# Patient Record
Sex: Female | Born: 1955 | Race: White | Hispanic: No | Marital: Married | Smoking: Current every day smoker
Health system: Southern US, Community
[De-identification: ages and names within clinical notes are randomized; demographics above are authoritative.]

## PROBLEM LIST (undated history)

## (undated) DIAGNOSIS — I10 Essential (primary) hypertension: Secondary | ICD-10-CM

## (undated) DIAGNOSIS — Z98811 Dental restoration status: Secondary | ICD-10-CM

## (undated) DIAGNOSIS — F419 Anxiety disorder, unspecified: Secondary | ICD-10-CM

## (undated) DIAGNOSIS — H3530 Unspecified macular degeneration: Secondary | ICD-10-CM

## (undated) DIAGNOSIS — M1990 Unspecified osteoarthritis, unspecified site: Secondary | ICD-10-CM

## (undated) DIAGNOSIS — N6310 Unspecified lump in the right breast, unspecified quadrant: Secondary | ICD-10-CM

## (undated) DIAGNOSIS — E039 Hypothyroidism, unspecified: Secondary | ICD-10-CM

## (undated) HISTORY — PX: OTHER SURGICAL HISTORY: SHX169

## (undated) HISTORY — DX: Anxiety disorder, unspecified: F41.9

## (undated) HISTORY — PX: THYROIDECTOMY: SHX17

## (undated) HISTORY — DX: Essential (primary) hypertension: I10

## (undated) HISTORY — DX: Unspecified macular degeneration: H35.30

## (undated) HISTORY — DX: Hypothyroidism, unspecified: E03.9

---

## 1998-07-25 ENCOUNTER — Ambulatory Visit (HOSPITAL_COMMUNITY): Admission: RE | Admit: 1998-07-25 | Discharge: 1998-07-25 | Payer: Self-pay | Admitting: Obstetrics and Gynecology

## 1999-09-18 ENCOUNTER — Encounter: Payer: Self-pay | Admitting: Gynecology

## 1999-09-18 ENCOUNTER — Ambulatory Visit (HOSPITAL_COMMUNITY): Admission: RE | Admit: 1999-09-18 | Discharge: 1999-09-18 | Payer: Self-pay | Admitting: Gynecology

## 1999-10-08 ENCOUNTER — Other Ambulatory Visit: Admission: RE | Admit: 1999-10-08 | Discharge: 1999-10-08 | Payer: Self-pay | Admitting: Gynecology

## 1999-11-15 ENCOUNTER — Other Ambulatory Visit: Admission: RE | Admit: 1999-11-15 | Discharge: 1999-11-15 | Payer: Self-pay | Admitting: Gynecology

## 2000-10-13 ENCOUNTER — Other Ambulatory Visit: Admission: RE | Admit: 2000-10-13 | Discharge: 2000-10-13 | Payer: Self-pay | Admitting: Gynecology

## 2001-10-19 ENCOUNTER — Other Ambulatory Visit: Admission: RE | Admit: 2001-10-19 | Discharge: 2001-10-19 | Payer: Self-pay | Admitting: Family Medicine

## 2002-10-14 ENCOUNTER — Other Ambulatory Visit: Admission: RE | Admit: 2002-10-14 | Discharge: 2002-10-14 | Payer: Self-pay | Admitting: Gynecology

## 2003-10-20 ENCOUNTER — Other Ambulatory Visit: Admission: RE | Admit: 2003-10-20 | Discharge: 2003-10-20 | Payer: Self-pay | Admitting: Obstetrics and Gynecology

## 2004-11-14 ENCOUNTER — Other Ambulatory Visit: Admission: RE | Admit: 2004-11-14 | Discharge: 2004-11-14 | Payer: Self-pay | Admitting: Obstetrics and Gynecology

## 2005-12-11 ENCOUNTER — Other Ambulatory Visit: Admission: RE | Admit: 2005-12-11 | Discharge: 2005-12-11 | Payer: Self-pay | Admitting: Obstetrics and Gynecology

## 2006-01-15 ENCOUNTER — Encounter: Payer: Self-pay | Admitting: Obstetrics and Gynecology

## 2006-02-21 ENCOUNTER — Ambulatory Visit (HOSPITAL_COMMUNITY): Admission: RE | Admit: 2006-02-21 | Discharge: 2006-02-21 | Payer: Self-pay | Admitting: Obstetrics and Gynecology

## 2006-02-21 ENCOUNTER — Encounter (INDEPENDENT_AMBULATORY_CARE_PROVIDER_SITE_OTHER): Payer: Self-pay | Admitting: Specialist

## 2006-02-21 HISTORY — PX: DILATATION & CURRETTAGE/HYSTEROSCOPY WITH RESECTOCOPE: SHX5572

## 2007-01-07 ENCOUNTER — Encounter: Admission: RE | Admit: 2007-01-07 | Discharge: 2007-01-07 | Payer: Self-pay | Admitting: Obstetrics and Gynecology

## 2007-01-22 ENCOUNTER — Ambulatory Visit: Payer: Self-pay | Admitting: Cardiology

## 2007-10-16 ENCOUNTER — Encounter (INDEPENDENT_AMBULATORY_CARE_PROVIDER_SITE_OTHER): Payer: Self-pay | Admitting: *Deleted

## 2007-10-16 LAB — CONVERTED CEMR LAB
Cholesterol: 163 mg/dL
Triglycerides: 69 mg/dL

## 2007-11-13 ENCOUNTER — Ambulatory Visit: Payer: Self-pay | Admitting: Cardiology

## 2009-10-12 ENCOUNTER — Encounter (INDEPENDENT_AMBULATORY_CARE_PROVIDER_SITE_OTHER): Payer: Self-pay | Admitting: *Deleted

## 2009-10-12 LAB — CONVERTED CEMR LAB
BUN: 13 mg/dL
Calcium: 9.9 mg/dL
Creatinine, Ser: 0.9 mg/dL
Sodium, serum: 137 mmol/L

## 2010-01-12 ENCOUNTER — Encounter (INDEPENDENT_AMBULATORY_CARE_PROVIDER_SITE_OTHER): Payer: Self-pay | Admitting: *Deleted

## 2010-01-26 ENCOUNTER — Encounter: Payer: Self-pay | Admitting: Family Medicine

## 2010-02-13 ENCOUNTER — Encounter (INDEPENDENT_AMBULATORY_CARE_PROVIDER_SITE_OTHER): Payer: Self-pay | Admitting: *Deleted

## 2010-02-13 DIAGNOSIS — M1990 Unspecified osteoarthritis, unspecified site: Secondary | ICD-10-CM | POA: Insufficient documentation

## 2010-02-13 DIAGNOSIS — F17200 Nicotine dependence, unspecified, uncomplicated: Secondary | ICD-10-CM | POA: Insufficient documentation

## 2010-02-13 DIAGNOSIS — I10 Essential (primary) hypertension: Secondary | ICD-10-CM | POA: Insufficient documentation

## 2010-02-13 DIAGNOSIS — F411 Generalized anxiety disorder: Secondary | ICD-10-CM | POA: Insufficient documentation

## 2010-02-13 DIAGNOSIS — G5600 Carpal tunnel syndrome, unspecified upper limb: Secondary | ICD-10-CM | POA: Insufficient documentation

## 2010-02-13 DIAGNOSIS — E039 Hypothyroidism, unspecified: Secondary | ICD-10-CM | POA: Insufficient documentation

## 2010-03-09 ENCOUNTER — Ambulatory Visit: Payer: Self-pay | Admitting: Family Medicine

## 2010-03-09 DIAGNOSIS — H3530 Unspecified macular degeneration: Secondary | ICD-10-CM | POA: Insufficient documentation

## 2010-03-30 ENCOUNTER — Ambulatory Visit: Payer: Self-pay | Admitting: Family Medicine

## 2010-04-03 ENCOUNTER — Telehealth: Payer: Self-pay | Admitting: Family Medicine

## 2010-07-23 ENCOUNTER — Telehealth: Payer: Self-pay | Admitting: Family Medicine

## 2010-10-01 ENCOUNTER — Other Ambulatory Visit: Payer: Self-pay | Admitting: Family Medicine

## 2010-10-01 ENCOUNTER — Ambulatory Visit
Admission: RE | Admit: 2010-10-01 | Discharge: 2010-10-01 | Payer: Self-pay | Source: Home / Self Care | Attending: Family Medicine | Admitting: Family Medicine

## 2010-10-01 DIAGNOSIS — E785 Hyperlipidemia, unspecified: Secondary | ICD-10-CM | POA: Insufficient documentation

## 2010-10-01 DIAGNOSIS — E663 Overweight: Secondary | ICD-10-CM | POA: Insufficient documentation

## 2010-10-01 DIAGNOSIS — E669 Obesity, unspecified: Secondary | ICD-10-CM | POA: Insufficient documentation

## 2010-10-01 LAB — HEPATIC FUNCTION PANEL
ALT: 38 U/L — ABNORMAL HIGH (ref 0–35)
AST: 31 U/L (ref 0–37)
Albumin: 4.4 g/dL (ref 3.5–5.2)
Alkaline Phosphatase: 61 U/L (ref 39–117)
Bilirubin, Direct: 0.1 mg/dL (ref 0.0–0.3)
Total Bilirubin: 0.8 mg/dL (ref 0.3–1.2)
Total Protein: 7.4 g/dL (ref 6.0–8.3)

## 2010-10-01 LAB — LIPID PANEL
Cholesterol: 201 mg/dL — ABNORMAL HIGH (ref 0–200)
HDL: 68.8 mg/dL (ref >39.00)
Total CHOL/HDL Ratio: 3
Triglycerides: 94 mg/dL (ref 0.0–149.0)
VLDL: 18.8 mg/dL (ref 0.0–40.0)

## 2010-10-01 LAB — LDL CHOLESTEROL, DIRECT: Direct LDL: 114.7 mg/dL

## 2010-10-14 LAB — CONVERTED CEMR LAB
ALT: 36 units/L — ABNORMAL HIGH (ref 0–35)
AST: 27 units/L (ref 0–37)
Albumin: 4.3 g/dL (ref 3.5–5.2)
BUN: 18 mg/dL (ref 6–23)
Basophils Absolute: 0 10*3/uL (ref 0.0–0.1)
CO2: 30 meq/L (ref 19–32)
Chloride: 105 meq/L (ref 96–112)
Cholesterol: 218 mg/dL — ABNORMAL HIGH (ref 0–200)
Creatinine, Ser: 0.8 mg/dL (ref 0.4–1.2)
Direct LDL: 135 mg/dL
Eosinophils Absolute: 0.3 10*3/uL (ref 0.0–0.7)
MCHC: 34.5 g/dL (ref 30.0–36.0)
MCV: 90.4 fL (ref 78.0–100.0)
Monocytes Absolute: 0.5 10*3/uL (ref 0.1–1.0)
Neutrophils Relative %: 58.9 % (ref 43.0–77.0)
Platelets: 183 10*3/uL (ref 150.0–400.0)
RDW: 12.5 % (ref 11.5–14.6)
Total Bilirubin: 0.5 mg/dL (ref 0.3–1.2)
Triglycerides: 72 mg/dL (ref 0.0–149.0)

## 2010-10-18 NOTE — Assessment & Plan Note (Signed)
Summary: rto 6 months.cbs   Vital Signs:  Patient profile:   55 year old female Weight:      160 pounds BMI:     28.00 Pulse rate:   90 / minute BP sitting:   138 / 80  (left arm)  Vitals Entered By: Doristine Devoid CMA (October 01, 2010 9:17 AM) CC: 6 month roa    History of Present Illness: 55 yo woman here today for f/u on  1) HTN- BP slightly higher than last visit.  admits to some anxiety about visit.  no CP, SOB, HAs, visual changes, edema.  2) elevated cholesterol- has been working on diet and exercise.  goal is to avoid meds.  3) overweight- used to go to [employer] 5x/week.  wants to resume exercising.  has lost 6 lbs since last visit.  4) tobacco use- down to 1/2 ppd.  goal is to quit when she resumes exercising.  Preventive Screening-Counseling & Management  Alcohol-Tobacco     Smoking Status: current     Smoking Cessation Counseling: yes     Smoke Cessation Stage: contemplative     Packs/Day: 0.5  Current Medications (verified): 1)  Levothyroxine Sodium 100 Mcg Tabs (Levothyroxine Sodium) .... Take One Tablet Daily 2)  Hyzaar 50-12.5 Mg Tabs (Losartan Potassium-Hctz) .... Take One Tablet Daily 3)  Citalopram Hydrobromide 40 Mg Tabs (Citalopram Hydrobromide) .... 1.5 Tabs Daily 4)  Osteo Bi-Flex Adv Double St  Tabs (Misc Natural Products) .... Take One Tablet Daily 5)  Vision Formula  Tabs (Multiple Vitamins-Minerals) .... Take One Tablet Daily  Allergies (verified): No Known Drug Allergies  Past History:  Past medical, surgical, family and social histories (including risk factors) reviewed, and no changes noted (except as noted below).  Past Medical History: Anxiety Hypertension Hypothyroidism Carpal Tunnel Osteoarthritis- R thumb Tobacco Abuse Macular Degeneration Hyperlipidemia-  Past Surgical History: Reviewed history from 02/13/2010 and no changes required. Thyroidectomy  Family History: Reviewed history from 03/09/2010 and no changes  required. CAD-mother,father HTN-mother,father DM-father STROKE-no COLON CA-no BREAST CA-no Dementia- mother  Social History: Reviewed history from 03/09/2010 and no changes required. 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 No apparent alopecia or balding. Neck:  No deformities, masses, or tenderness noted. Lungs:  Normal respiratory effort, chest expands symmetrically. Lungs are clear to auscultation, no crackles or wheezes. Heart:  Normal rate and regular rhythm. S1 and S2 normal without gallop, murmur, click, rub or other extra sounds. Abdomen:  soft, NT/ND, +BS Pulses:  +2 carotid, radial, DP Extremities:  no C/C/E   Impression & Recommendations:  Problem # 1:  HYPERTENSION (ICD-401.9) Assessment Unchanged asymptomatic.  continue meds at this time.  pt's goal is to get off meds. Her updated medication list for this problem includes:    Hyzaar 50-12.5 Mg Tabs (Losartan potassium-hctz) .Marland Kitchen... Take one tablet daily  Problem # 2:  HYPERLIPIDEMIA (ICD-272.4) Assessment: New has been attempting to control w/ diet and exercise.  recheck labs today. Orders: Venipuncture (56213) TLB-Lipid Panel (80061-LIPID) TLB-Hepatic/Liver Function Pnl (80076-HEPATIC) Specimen Handling (08657)  Problem # 3:  OVERWEIGHT (ICD-278.02) Assessment: New has lost 6 lbs since last visit.  she plans on re-starting her exercise program.  Problem # 4:  TOBACCO ABUSE (ICD-305.1) Assessment: Unchanged again advised pt to quit.  will follow.  Complete Medication List: 1)  Levothyroxine Sodium 100 Mcg Tabs (Levothyroxine sodium) .... Take one tablet daily 2)  Hyzaar 50-12.5 Mg Tabs (Losartan potassium-hctz) .... Take one tablet daily 3)   Citalopram Hydrobromide 40 Mg Tabs (Citalopram hydrobromide) .... 1.5 tabs daily 4)  Osteo Bi-flex Adv Double St Tabs (Misc natural products) .... Take one tablet daily 5)  Vision Formula Tabs (Multiple vitamins-minerals) .... Take one tablet daily  Patient Instructions: 1)  Please schedule your complete physical for July- do not eat before this appt 2)  We'll notify you of your lab results 3)  Call with any questions or concerns 4)  I'm excited about your decision to restart your exercise program! 5)  Call with any questions or concerns 6)  Happy New Year!!   Orders Added: 1)  Venipuncture [25956] 2)  TLB-Lipid Panel [80061-LIPID] 3)  TLB-Hepatic/Liver Function Pnl [80076-HEPATIC] 4)  Specimen Handling [99000] 5)  Est. Patient Level IV [38756]

## 2010-10-18 NOTE — Assessment & Plan Note (Signed)
Summary: CPX/FASTING//KN   Vital Signs:  Patient profile:   55 year old female Height:      63.50 inches Weight:      166 pounds BMI:     29.05 Pulse rate:   102 / minute BP sitting:   120 / 80  (left arm)  Vitals Entered By: Doristine Devoid CMA (March 30, 2010 8:02 AM) CC: CPX AND LABS   History of Present Illness: 55 yo woman here today for CPE.  UTD on pap and mammogram.  colonoscopy at [facility] 1-2 yrs ago.  Depression/anxiety- much improved on increased dose of Citalopram.  HTN- adequate control.  asymptomatic.  compliant w/ meds.  hypothyroid- due for labs  Preventive Screening-Counseling & Management  Alcohol-Tobacco     Alcohol drinks/day: <1     Smoking Status: current     Smoking Cessation Counseling: yes  Caffeine-Diet-Exercise     Does Patient Exercise: no      Drug Use:  never.    Problems Prior to Update: 1)  Physical Examination  (ICD-V70.0) 2)  Macular Degeneration  (ICD-362.50) 3)  Tobacco Abuse  (ICD-305.1) 4)  Carpal Tunnel Syndrome, Right  (ICD-354.0) 5)  Osteoarthritis  (ICD-715.90) 6)  Hypothyroidism  (ICD-244.9) 7)  Hypertension  (ICD-401.9) 8)  Anxiety  (ICD-300.00)  Current Medications (verified): 1)  Levothyroxine Sodium 100 Mcg Tabs (Levothyroxine Sodium) .... Take One Tablet Daily 2)  Hyzaar 50-12.5 Mg Tabs (Losartan Potassium-Hctz) .... Take One Tablet Daily 3)  Calcium 600-200 Mg-Unit Tabs (Calcium-Vitamin D) .... Take 2 Tablets Daily 4)  Citalopram Hydrobromide 40 Mg Tabs (Citalopram Hydrobromide) .Marland Kitchen.. 1 Tab Daily 5)  Fish Oil 1200 Mg Caps (Omega-3 Fatty Acids) .... Take One Tablet Daily 6)  Osteo Bi-Flex Adv Double St  Tabs (Misc Natural Products) .... Take One Tablet Daily 7)  [facility]  Tabs (Multiple Vitamins-Minerals) .... Take One Tablet Daily 8)  Vision Formula  Tabs (Multiple Vitamins-Minerals) .... 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    Physical Exam  General:  Well-developed,well-nourished,in no acute distress; alert,appropriate and cooperative throughout examination Head:  Normocephalic and atraumatic without obvious abnormalities. No apparent alopecia or balding. Eyes:  No corneal or conjunctival inflammation noted. EOMI. Perrla. Funduscopic exam deferred to ophtho. Vision grossly normal. Ears:  External ear exam shows no significant lesions or deformities.  Otoscopic examination reveals clear canals, tympanic membranes are intact bilaterally without bulging, retraction, inflammation or discharge. Hearing is grossly normal bilaterally. Nose:  External nasal examination shows no deformity or inflammation. Nasal mucosa are pink and moist without lesions or exudates. Mouth:  Oral mucosa and oropharynx without lesions or exudates.  Teeth in good repair. Neck:  No deformities, masses, or tenderness noted. Breasts:  deferred to gyn Lungs:  Normal respiratory effort, chest expands symmetrically. Lungs are clear to auscultation, no crackles  or wheezes. Heart:  Normal rate and regular rhythm. S1 and  S2 normal without gallop, murmur, click, rub or other extra sounds. Abdomen:  Bowel sounds positive,abdomen soft and non-tender without masses, organomegaly or hernias noted. Genitalia:  deferred to gyn Msk:  No deformity or scoliosis noted of thoracic or lumbar spine.   Pulses:  +2 carotid, radial, DP Extremities:  no C/C/E Neurologic:  No cranial nerve deficits noted. Station and gait are normal. Plantar reflexes are down-going bilaterally. DTRs are symmetrical throughout. Sensory, motor and coordinative functions appear intact. Skin:  Intact without suspicious lesions or rashes Cervical Nodes:  No lymphadenopathy noted Axillary Nodes:  No palpable lymphadenopathy Inguinal Nodes:  No significant adenopathy Psych:  Cognition and judgment appear intact. Alert and cooperative with normal attention span and concentration. No apparent delusions, illusions, hallucinations   Impression & Recommendations:  Problem # 1:  PHYSICAL EXAMINATION (ICD-V70.0) Assessment New PE WNL.  UTD on health maintainence.  check labs.  anticipatory guidance provided. Orders: TLB-Lipid Panel (80061-LIPID) TLB-Hepatic/Liver Function Pnl (80076-HEPATIC) T-Vitamin D (25-Hydroxy) (16109-60454) Specimen Handling (09811) Specimen Handling (91478)  Problem # 2:  ANXIETY (ICD-300.00) Assessment: Improved pt feels mood has improved on increased dose. Her updated medication list for this problem includes:    Citalopram Hydrobromide 40 Mg Tabs (Citalopram hydrobromide) .Marland Kitchen... 1 tab daily  Problem # 3:  HYPOTHYROIDISM (ICD-244.9) Assessment: Unchanged check labs.  adjust meds as needed. Her updated medication list for this problem includes:    Levothyroxine Sodium 100 Mcg Tabs (Levothyroxine sodium) .Marland Kitchen... Take one tablet daily  Orders: Venipuncture (29562) TLB-TSH (Thyroid Stimulating Hormone) (84443-TSH)  Problem # 4:  HYPERTENSION (ICD-401.9) Assessment: Unchanged BP adequately controlled.  check labs. Her  updated medication list for this problem includes:    Hyzaar 50-12.5 Mg Tabs (Losartan potassium-hctz) .Marland Kitchen... Take one tablet daily  Orders: TLB-BMP (Basic Metabolic Panel-BMET) (80048-METABOL) TLB-CBC Platelet - w/Differential (85025-CBCD) Specimen Handling (13086)  Complete Medication List: 1)  Levothyroxine Sodium 100 Mcg Tabs (Levothyroxine sodium) .... Take one tablet daily 2)  Hyzaar 50-12.5 Mg Tabs (Losartan potassium-hctz) .... Take one tablet daily 3)  Calcium 600-200 Mg-unit Tabs (Calcium-vitamin d) .... Take 2 tablets daily 4)  Citalopram Hydrobromide 40 Mg Tabs (Citalopram hydrobromide) .Marland Kitchen.. 1 tab daily 5)  Fish Oil 1200 Mg Caps (Omega-3 fatty acids) .... Take one tablet daily 6)  Osteo Bi-flex Adv Double St Tabs (Misc natural products) .... Take one tablet daily 7)  [facility] Tabs (Multiple vitamins-minerals) .... Take one tablet daily 8)  Vision Formula Tabs (Multiple vitamins-minerals) .... Take one tablet daily  Patient Instructions: 1)  Please schedule a follow-up appointment in 6 months to recheck your blood pressure. 2)  Call with any questions or concerns 3)  Consider counseling if the stress increases 4)  We'll notify you of your lab results 5)  Have a great summer!   Appended Document: CPX/FASTING//KN     Clinical Lists Changes  Orders: Added new Service order of EKG w/ Interpretation (93000) - Signed

## 2010-10-18 NOTE — Progress Notes (Signed)
Summary: Lab Number  Phone Note Call from Patient   Caller: Patient Summary of Call: Wants to know what the Cholesterol numbers from her lab results  holesterol LDL - Direct                             135.0 mg/dL     Optimal:  <161 mg/dL     Near or Above Optimal:  100-129 mg/dL...............................    Called pt she is aware of lab numbers Initial call taken by: Kathrynn Speed CMA,  April 03, 2010 11:29 AM

## 2010-10-18 NOTE — Assessment & Plan Note (Signed)
Summary: new to est//[facility] ins//lch   Vital Signs:  Patient profile:   55 year old female Height:      63.50 inches Weight:      166 pounds BMI:     29.05 Pulse rate:   80 / minute BP sitting:   134 / 90  (left arm)  Vitals Entered By: Doristine Devoid (March 09, 2010 3:42 PM) CC: NEW EST- change in depression medication    History of Present Illness: 55 yo woman here today to establish care.    1) Depression- currently on Citalopram 20mg  daily.  feels it's not strong enough.  parents are elderly, mom w/ dementia.  reports easily frustrated, now w/ shorter fuse.  having a hard time dealing w/ work, parents, home life, etc.  doesn't want to 'snap at people'  2) HTN- tolerating Hyzaar.  no CP, SOB, HAs, visual changes, edema  3) Health Maintainence- UTD on pap and mammogram, due for labs.  4) Tobacco use- 1/2 ppd.  knows she needs to quit but w/ all her current stressors doesn't feel like now is the right time.  5) hypothyroid- has been stable on meds for awhile.  s/p thyroidectomy.  Preventive Screening-Counseling & Management  Alcohol-Tobacco     Smoking Status: current     Smoking Cessation Counseling: yes     Smoke Cessation Stage: contemplative     Packs/Day: 0.5  Caffeine-Diet-Exercise     Does Patient Exercise: no      Sexual History:  currently monogamous.        Drug Use:  never.    Current Medications (verified): 1)  Levothyroxine Sodium 100 Mcg Tabs (Levothyroxine Sodium) .... Take One Tablet Daily 2)  Hyzaar 50-12.5 Mg Tabs (Losartan Potassium-Hctz) .... Take One Tablet Daily 3)  Calcium 600-200 Mg-Unit Tabs (Calcium-Vitamin D) .... Take 2 Tablets Daily 4)  Citalopram Hydrobromide 40 Mg Tabs (Citalopram Hydrobromide) .Marland Kitchen.. 1 Tab Daily 5)  Fish Oil 1200 Mg Caps (Omega-3 Fatty Acids) .... Take One Tablet Daily 6)  Osteo Bi-Flex Adv Double St  Tabs (Misc Natural Products) .... Take One Tablet Daily 7)  [facility]  Tabs (Multiple Vitamins-Minerals) ....  Take One Tablet Daily 8)  Vision Formula  Tabs (Multiple Vitamins-Minerals) .... 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  The patient denies anorexia, fever, weight loss, decreased hearing, chest pain, syncope, dyspnea on exertion, peripheral edema, prolonged cough, headaches, abdominal pain, abnormal bleeding, and enlarged lymph nodes.    Physical Exam  General:  Well-developed,well-nourished,in no acute distress; alert,appropriate and cooperative throughout examination Head:  Normocephalic and atraumatic without obvious abnormalities. No apparent alopecia or balding. Neck:  scar at site of thyroidectomy Lungs:  Normal respiratory effort, chest expands symmetrically. Lungs are clear to auscultation, no crackles or wheezes. Heart:  Normal rate and regular rhythm. S1 and S2 normal without gallop, murmur, click, rub or other extra sounds. Pulses:  +2 carotid, radial, DP Extremities:  no C/C/E Cervical Nodes:  No lymphadenopathy noted Psych:  emotional while discussing parents but appropriately so   Impression & Recommendations:  Problem # 1:  ANXIETY (ICD-300.00) pt's anxiety not controlled on Celexa 20mg .  will increase to 40mg  and follow closely.  strongly encouraged therapy- pt has done this before and has a therapist.  also encouraged physical outlet in the form of exercise.  Pt expresses understanding  and is in agreement w/ this plan. The following medications were removed from the medication list:    Alprazolam 0.25 Mg Tabs (Alprazolam) .Marland Kitchen... Take one tablet as needed Her updated medication list for this problem includes:    Citalopram Hydrobromide 40 Mg Tabs (Citalopram hydrobromide) .Marland Kitchen... 1 tab daily  Problem # 2:  HYPERTENSION (ICD-401.9) Assessment: Comment Only adequately controlled.  asymptomatic.  continue meds. Her updated medication list for this problem includes:    Hyzaar 50-12.5 Mg Tabs (Losartan potassium-hctz) .Marland Kitchen... Take one tablet daily  Problem # 3:  HYPOTHYROIDISM (ICD-244.9) Assessment: Comment Only will check labs at upcoming CPE.  adjust meds as needed.  pt currently asymptomatic. Her updated medication list for this problem includes:    Levothyroxine Sodium 100 Mcg Tabs (Levothyroxine sodium) .Marland Kitchen... Take one tablet daily  Problem # 4:  TOBACCO ABUSE (ICD-305.1) encouraged pt to quit but she doesn't feel like now is the right time w/ her current life stressors.  will continue to follow.  Complete Medication List: 1)  Levothyroxine Sodium 100 Mcg Tabs (Levothyroxine sodium) .... Take one tablet daily 2)  Hyzaar 50-12.5 Mg Tabs (Losartan potassium-hctz) .... Take one tablet daily 3)  Calcium 600-200 Mg-unit Tabs (Calcium-vitamin d) .... Take 2 tablets daily 4)  Citalopram Hydrobromide 40 Mg Tabs (Citalopram hydrobromide) .Marland Kitchen.. 1 tab daily 5)  Fish Oil 1200 Mg Caps (Omega-3 fatty acids) .... Take one tablet daily 6)  Osteo Bi-flex Adv Double St Tabs (Misc natural products) .... Take one tablet daily 7)  [facility] Tabs (Multiple vitamins-minerals) .... Take one tablet daily 8)  Vision Formula Tabs (Multiple vitamins-minerals) .... Take one tablet daily  Patient Instructions: 1)  Schedule your complete physical in the next 1-3 weeks- do not eat before this appt so we can do labs 2)  Take 2 of the Citalopram that you have and then your new script will be  1 daily. 3)  Stop smoking!! 4)  Try and get regular exercise as a stress outlet 5)  Consider restarting therapy 6)  Hang in there!!! Prescriptions: CITALOPRAM HYDROBROMIDE 40 MG TABS (CITALOPRAM HYDROBROMIDE) 1 tab daily  #30 x 3   Entered and Authorized by:   Neena Rhymes MD   Signed by:   Neena Rhymes MD on 03/09/2010   Method used:   [facility]        [facility] [phone]* (retail)       [address]       [place], [place]  [address]       Ph: [phone]       Fax: [phone]   RxID:   [id number]

## 2010-10-18 NOTE — Miscellaneous (Signed)
  Medications Added ALPRAZOLAM 0.25 MG TABS (ALPRAZOLAM) take one tablet as needed MULTI-VITAMIN  TABS (MULTIPLE VITAMIN) take one tablet daily LEVOTHYROXINE SODIUM 100 MCG TABS (LEVOTHYROXINE SODIUM) take one tablet daily HYZAAR 50-12.5 MG TABS (LOSARTAN POTASSIUM-HCTZ) take one tablet daily CALCIUM 600-200 MG-UNIT TABS (CALCIUM-VITAMIN D) take 2 tablets daily VITAMIN D 400 UNIT TABS (CHOLECALCIFEROL) take one tablet daily CITALOPRAM HYDROBROMIDE 20 MG TABS (CITALOPRAM HYDROBROMIDE) take one tablet daily ICAPS  CAPS (MULTIPLE VITAMINS-MINERALS) take one tablet daily       Clinical Lists Changes  Problems: Added new problem of ANXIETY (ICD-300.00) Added new problem of HYPERTENSION (ICD-401.9) Added new problem of HYPOTHYROIDISM (ICD-244.9) Added new problem of OSTEOARTHRITIS (ICD-715.90) Added new problem of CARPAL TUNNEL SYNDROME, RIGHT (ICD-354.0) Added new problem of TOBACCO ABUSE (ICD-305.1) Medications: Added new medication of ALPRAZOLAM 0.25 MG TABS (ALPRAZOLAM) take one tablet as needed Added new medication of MULTI-VITAMIN  TABS (MULTIPLE VITAMIN) take one tablet daily Added new medication of LEVOTHYROXINE SODIUM 100 MCG TABS (LEVOTHYROXINE SODIUM) take one tablet daily Added new medication of HYZAAR 50-12.5 MG TABS (LOSARTAN POTASSIUM-HCTZ) take one tablet daily Added new medication of CALCIUM 600-200 MG-UNIT TABS (CALCIUM-VITAMIN D) take 2 tablets daily Added new medication of VITAMIN D 400 UNIT TABS (CHOLECALCIFEROL) take one tablet daily Added new medication of CITALOPRAM HYDROBROMIDE 20 MG TABS (CITALOPRAM HYDROBROMIDE) take one tablet daily Added new medication of ICAPS  CAPS (MULTIPLE VITAMINS-MINERALS) take one tablet daily Observations: Added new observation of PAST SURG HX: Thyroidectomy  (02/13/2010 14:18) Added new observation of PMH THYROEHX: yes (02/13/2010 14:18) Added new observation of PMHOSTEOARTH: yes (02/13/2010 14:18) Added new observation of PAST MED  HX: Anxiety Hypertension Hypothyroidism Carpal Tunnel Osteoarthritis- R thumb Tobacco Abuse (02/13/2010 14:18) Added new observation of HYPOTHYRDHX: yes (02/13/2010 14:18) Added new observation of HX OF HTN: yes (02/13/2010 14:18) Added new observation of PMH ANXIETY: yes (02/13/2010 14:18) Added new observation of TSH: 4.23 microintl units/mL (01/12/2010 14:18) Added new observation of CALCIUM: 9.9 mg/dL (52/84/1324 40:10) Added new observation of GLUCOSE SER: 66 mg/dL (27/25/3664 40:34) Added new observation of CREATININE: 0.9 mg/dL (74/25/9563 87:56) Added new observation of BUN: 13 mg/dL (43/32/9518 84:16) Added new observation of CO2 TOTAL: 31 mmol/L (10/12/2009 14:18) Added new observation of CHLORIDE: 98 mmol/L (10/12/2009 14:18) Added new observation of POTASSIUM: 3.8 mmol/L (10/12/2009 14:18) Added new observation of SODIUM: 137 mmol/L (10/12/2009 14:18) Added new observation of TRIGLYC TOT: 69 mg/dL (60/63/0160 10:93) Added new observation of LDL: 89 mg/dL (23/55/7322 02:54) Added new observation of HDL: 60 mg/dL (27/02/2375 28:31) Added new observation of CHOLESTEROL: 163 mg/dL (51/76/1607 37:10) Added new observation of TD BOOSTER: Historical  (09/17/2004 14:22)      Past Medical History:    Anxiety    Hypertension    Hypothyroidism    Carpal Tunnel    Osteoarthritis- R thumb    Tobacco Abuse  Past Surgical History:    Thyroidectomy    Preventive Care Screening  Last Tetanus Booster:    Date:  09/17/2004    Results:  Historical

## 2010-10-18 NOTE — Progress Notes (Signed)
Summary: med increase  Phone Note Refill Request Call back at Work Phone [phone]   Refills Requested: Medication #1:  CITALOPRAM HYDROBROMIDE 40 MG TABS 1 tab daily Pt states that med is not strong enough and would like to know if med can be increased. Pt states that she has been having a dificult time dealing with her mom right now. [facility] [address]..........Marland KitchenFelecia Deloach CMA  July 23, 2010 8:06 AM    Follow-up for Phone Call        can increase to 1.5 tabs daily Follow-up by: Neena Rhymes MD,  July 23, 2010 9:37 AM  Additional Follow-up for Phone Call Additional follow up Details #1::        pt aware new rx sent in ..............Marland KitchenFelecia Deloach CMA  July 23, 2010 9:59 AM     New/Updated Medications: CITALOPRAM HYDROBROMIDE 40 MG TABS (CITALOPRAM HYDROBROMIDE) 1.5 tabs daily Prescriptions: CITALOPRAM HYDROBROMIDE 40 MG TABS (CITALOPRAM HYDROBROMIDE) 1.5 tabs daily  #45 x 2   Entered by:   Jeremy Johann CMA   Authorized by:   Neena Rhymes MD   Signed by:   Jeremy Johann CMA on 07/23/2010   Method used:   [facility]ed to ...       [facility] [phone]* (retail)       [address]       [place], [place]  [address]       Ph: [phone]       Fax: [phone]   RxID:   [id number]

## 2010-12-07 ENCOUNTER — Telehealth: Payer: Self-pay | Admitting: Family Medicine

## 2010-12-07 MED ORDER — LEVOTHYROXINE SODIUM 100 MCG PO TABS: 100.0000 ug | ORAL_TABLET | Freq: Every day | ORAL | Status: DC

## 2010-12-07 NOTE — Telephone Encounter (Signed)
Pt is calling for refill on her [facility]othyroxine [facility]

## 2010-12-07 NOTE — Telephone Encounter (Signed)
Refill sent, pt aware. 

## 2010-12-24 ENCOUNTER — Other Ambulatory Visit: Payer: Self-pay | Admitting: Family Medicine

## 2010-12-24 NOTE — Telephone Encounter (Signed)
Do you fill for this pt? I do not see where it has been filled by you.

## 2010-12-24 NOTE — Telephone Encounter (Signed)
Ok for #30, 6 refills 

## 2011-01-29 NOTE — Assessment & Plan Note (Signed)
[facility]                            CARDIOLOGY OFFICE NOTE   TYLENE, QUASHIE                    MRN:          [id number]  DATE:01/22/2007                            DOB:          March 25, 1956    CARDIOLOGY CONSULTATION   I was asked by Dr. Richardean Chimera to evaluate Justin Mend, with new  onset hypertension and other cardiac risk factors.   She was told she had hypertension about two years ago.  She lost weight  and it went away.   She is 55 years old, married, and has one child.  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   She used to enjoy exercising but has cut way back.   She says she wants to lose 47 pounds.   She has no symptoms of ischemic heart disease, no history of congestive  heart failure.   PAST MEDICAL HISTORY:  SHE IS INTOLERANT OF PENICILLIN.   She smokes a half a pack of cigarettes a day.  She does not drink any  alcohol.  She drinks caffeine.   CURRENT MEDICATIONS:  Benicar/HCT unknown dose, Ocuvite, a multivitamin,  calcium, and Levothyroxine 100 mcg a day.  She was put on Benicar/HCT by  Dr. Arelia Sneddon.  Her blood pressure has actually dropped from 170/100 to  normal range nearly.   She is status post thyroidectomy in 1978.   FAMILY HISTORY:  Negative for premature coronary disease.   SOCIAL HISTORY:  She works at the [employer]  She is married and has one child.   REVIEW OF SYSTEMS:  Other than the HPI, she has a history of some  irritable bowel.   PHYSICAL EXAMINATION:  VITAL SIGNS:  Today, her blood pressure is  124/90, pulse is 95 and regular, her weight is 167, she is 5 foot 3.  HEENT:  Normocephalic, atraumatic, PERRLA, extraocular movements intact,  sclerae are clear, facial symmetry is normal.  NECK:  Supple, carotid upstrokes are equal bilaterally without bruits,  there is no JVD, thyroid is not enlarged, trachea is midline.  LUNGS:   Clear.  HEART:  A nondisplaced PMI, there is a normal S1 and S2, there is no S4,  S2 split, there is no murmur.  ABDOMEN:  Soft with good bowel sounds, no midline bruit, there is no  hepatomegaly.  EXTREMITIES:  Without cyanosis, clubbing, or edema, pulses are intact.  NEURO EXAM:  Intact.  SKIN:  Intact.   EKG shows sinus rhythm; normal EKG.   ASSESSMENT AND PLAN:  I spent about 30 minutes talking to Mrs. Verner  about therapeutic lifestyle changes.  We have covered everything from  exercising/walking three hours per week, losing about 20 to 25 pounds  and keeping it off, healthy choices with all food groups, and quitting  smoking.  We specifically talked about quitting smoking for quite some  time.   I will call [facility] in [place] to renew her Benicar/HCT since she does not  have the dose.  I will plan on seeing her back in three months.  Thomas C. Daleen Squibb, MD, [facility]  Electronically Signed    TCW/MedQ  DD: 01/22/2007  DT: 01/22/2007  Job #: [id number]   cc:   Juluis Mire, M.D.  [place], [place] [address] [address]

## 2011-01-29 NOTE — Assessment & Plan Note (Signed)
[facility]                            CARDIOLOGY OFFICE NOTE   Alexis Wood, Alexis Wood                    MRN:          [id number]  DATE:11/13/2007                            DOB:          10-14-1955    [name] comes back today.  She has really done remarkably well.  She has lost from 167-143!   She really wants to come off her blood pressure medication.  She is  exercising 5 hours a week on her treadmill.   She recently had blood work at [facility] in [place], says her  cholesterol was good.  I do not have the results.  She also had her  thyroid panel checked and her chemistry checked and that was normal,  too.   She is on Benicar HCTZ 20/12.5 daily, levothyroxine 0.88 mcg a day.   Her blood pressure today is 117/77.  Down from 124/90 on last visit.  Her heart rate is 80 and regular.  Her weight is 143.  HEENT:  Normocephalic, atraumatic.  PERRL.  Extraocular movements intact.  Sclerae are clear.  Face symmetry normal.  Carotid upstrokes are equal  bilaterally without bruits, no JVD.  Lungs are clear.  Heart reveals a  regular rate and rhythm.  No gallop.  Normal S1, S2.  Abdomen exam was  soft, good bowel sounds.  No midline bruit.  Extremities had no  cyanosis, clubbing or edema.  Pulses are intact.  Neuro exam is intact.   EKG shows normal sinus rhythm.  There are no acute EKG changes.   I am delighted with how [name] has done.  I have asked her to  discontinue her Benicar HCTZ and check her pressures at work.  Her goal  is 130/80.  If she is above this and clearly above or equal to 140/90,  she will start back on her Benicar HCTZ.  Her goal is to lose additional  weight and continue her exercise program.  I suspect she will not need  her antihypertensive this time in her life.   I will plan on seeing her back in 6 months or p.r.n.Marland Kitchen     Thomas C. Daleen Squibb, MD, [facility]  Electronically Signed    TCW/MedQ  DD: 11/13/2007   DT: 11/14/2007  Job #: [id number]   cc:   Juluis Mire, M.D.

## 2011-02-01 NOTE — Op Note (Signed)
NAMENELY, DEDMON             ACCOUNT NO.:  [id number]   MEDICAL RECORD NO.:  [id number]          PATIENT TYPE:  AMB   LOCATION:  SDC                           [facility]ILITY:  [facility]   PHYSICIAN:  Juluis Mire, M.D.   DATE OF BIRTH:  1955-09-18   DATE OF PROCEDURE:  02/21/2006  DATE OF DISCHARGE:                                 OPERATIVE REPORT   PREOPERATIVE DIAGNOSIS:  1.  Abnormal cervical cytology with endometrial cells.  2.  Endometrial polyp.   POSTOPERATIVE DIAGNOSIS:  1.  Abnormal cervical cytology with endometrial cells.  2.  Endometrial polyp.   OPERATIVE PROCEDURE:  1.  Paracervical block.  2.  Endocervical curettings.  3.  Cervical dilatation.  4.  Hysteroscopy with resection of polyp.  5.  Multiple endometrial biopsies.  6.  Endometrial curettings.   SURGEON:  Juluis Mire, M.D.   ANESTHESIA:  Paracervical block with sedation.   ESTIMATED BLOOD LOSS:  Minimal.   PACKS AND DRAINS:  None.   INTRAOPERATIVE BLOOD REPLACED:  None.   COMPLICATIONS:  None.   INDICATIONS:  Are dictated in the History and Physical.   PROCEDURE WAS AS FOLLOWS:  Patient taken to the OR, placed in the supine  position.  After sedation was placed in the dorsal lithotomy position using  the Allen stirrups.  The patient was then draped sterile field.  A speculum  was placed in the vaginal vault.  The cervix and vagina cleansed with  Betadine.  A paracervical block was then instituted using 1% Nesacaine.  The  cervix was secured with a single tooth tenaculum.  Endocervical curettings  were then obtained.  The uterus was sounded at approximately 8 cm.  The  cervix __________ serially dilated to a size 33 Pratt dilator.  Operative  hysteroscope was then introduced.  The intrauterine cavity was distended  using sorbitol.  Visualization revealed a uterine polyp, intrauterine polyp,  on the right uterine sidewall.  This was resected totally and sent for  pathological review.  Multiple  endometrial biopsies were then obtained.  Endometrial curettings  were then obtained.  We had good hemostasis, no signs of perforation.  A  single tenaculum and speculum were then removed.  Patient taken out of the  dorsal lithotomy position.  Once alert, transferred to the recovery room in  good condition.  Sponge, instrument and needle count reported as correct by  __________ nurse.      Juluis Mire, M.D.  Electronically Signed     JSM/MEDQ  D:  02/21/2006  T:  02/21/2006  Job:  [id number]

## 2011-02-01 NOTE — H&P (Signed)
[name], [name]             ACCOUNT NO.:  [id number]   MEDICAL RECORD NO.:  [id number]          PATIENT TYPE:  AMB   LOCATION:  SDC                           [facility]:  [facility]   PHYSICIAN:  Juluis Mire, M.D.   DATE OF BIRTH:  Jul 20, 1956   DATE OF ADMISSION:  02/21/2006  DATE OF DISCHARGE:                                HISTORY & PHYSICAL   HISTORY OF PRESENT ILLNESS:  The patient is a 55 year old gravida 2, para 1,  abortus 1, postmenopausal patient who presents for hysteroscopy.   In relation to the present admission, her last Pap smear revealed  endometrial cells that were out of phase.  In view of her being  postmenopausal, she underwent a saline infusion ultrasound with the finding  of a small endometrial polyp; for this, she now presents for hysteroscopic  resection.   ALLERGIES:  Allergies include PENICILLIN.   MEDICATIONS:  She is on Synthroid for thyroid replacement as well as Benicar  HCT for management of hypertension.   MEDICAL HISTORY:  1.  History of previous thyroidectomy for which she is on Synthroid.  2.  History of hypertension, being actively managed.   SURGICAL HISTORY:  As noted, previous thyroidectomy.   OBSTETRICAL HISTORY:  Vaginal delivery as well as miscarriage.   FAMILY HISTORY:  Noncontributory.   SOCIAL HISTORY:  Social history does reveal a half-a-pack-per-day tobacco  use and occasional alcohol use.   REVIEW OF SYSTEMS:  Noncontributory.   PHYSICAL EXAMINATION:  VITAL SIGNS: The patient is afebrile with stable  vital signs.  HEENT: The patient is normocephalic.  Pupils are equal, round and reactive  to light and accommodation.  Extraocular movements were intact.  Sclerae and  conjunctivae were clear.  Oropharynx clear.  NECK: Neck without thyromegaly.  BREASTS: No discrete masses.  LUNGS: Clear.  CARDIOVASCULAR: Regular rate and rhythm without murmurs or gallops.  ABDOMEN: Exam was benign.  No mass, organomegaly or tenderness.  PELVIC: Normal external genitalia.  Vaginal mucosa is clear.  Cervix is  unremarkable.  Uterus of normal size, shape and contour.  Adnexa free of  masses or tenderness.  EXTREMITIES: Trace edema.  NEUROLOGIC: Exam is grossly within normal limits.   IMPRESSION:  1.  Abnormal cervical cytology with endometrial cells.  2.  Endometrial polyp.  3.  Hypertension.  4.  Hypothyroidism .   PLAN:  The patient will undergo hysteroscopic evaluation and resection of  polyp.  The nature of the procedure and its indication are discussed.  The  risks have been discussed including the risk of infection, the risk of  vascular injury that could lead to hemorrhage requiring transfusion or  possible hysterectomy, risk of perforation that could lead to injury to  adjacent organs requiring exploratory surgery, the risks of deep venous  thrombosis and pulmonary embolus.  The patient expressed understanding of  indications and risks and understanding of indications and options.      Juluis Mire, M.D.  Electronically Signed     JSM/MEDQ  D:  02/21/2006  T:  02/21/2006  Job:  [id number]

## 2011-03-26 ENCOUNTER — Ambulatory Visit (INDEPENDENT_AMBULATORY_CARE_PROVIDER_SITE_OTHER): Payer: 59 | Admitting: Family Medicine

## 2011-03-26 ENCOUNTER — Encounter: Payer: Self-pay | Admitting: Family Medicine

## 2011-03-26 DIAGNOSIS — E785 Hyperlipidemia, unspecified: Secondary | ICD-10-CM

## 2011-03-26 DIAGNOSIS — F411 Generalized anxiety disorder: Secondary | ICD-10-CM

## 2011-03-26 DIAGNOSIS — I10 Essential (primary) hypertension: Secondary | ICD-10-CM

## 2011-03-26 DIAGNOSIS — E039 Hypothyroidism, unspecified: Secondary | ICD-10-CM

## 2011-03-26 DIAGNOSIS — Z0000 Encounter for general adult medical examination without abnormal findings: Secondary | ICD-10-CM | POA: Insufficient documentation

## 2011-03-26 LAB — LIPID PANEL
HDL: 65.9 mg/dL (ref >39.00)
Total CHOL/HDL Ratio: 3
Triglycerides: 98 mg/dL (ref 0.0–149.0)
VLDL: 19.6 mg/dL (ref 0.0–40.0)

## 2011-03-26 LAB — HEPATIC FUNCTION PANEL
AST: 17 U/L (ref 0–37)
Albumin: 4.5 g/dL (ref 3.5–5.2)
Alkaline Phosphatase: 64 U/L (ref 39–117)
Bilirubin, Direct: 0 mg/dL (ref 0.0–0.3)
Total Bilirubin: 0.2 mg/dL — ABNORMAL LOW (ref 0.3–1.2)

## 2011-03-26 LAB — CBC WITH DIFFERENTIAL/PLATELET
Basophils Absolute: 0 10*3/uL (ref 0.0–0.1)
Eosinophils Absolute: 0.4 10*3/uL (ref 0.0–0.7)
Eosinophils Relative: 5.2 % — ABNORMAL HIGH (ref 0.0–5.0)
HCT: 44.7 % (ref 36.0–46.0)
Lymphs Abs: 2.7 10*3/uL (ref 0.7–4.0)
MCV: 91.5 fl (ref 78.0–100.0)
Monocytes Absolute: 0.4 10*3/uL (ref 0.1–1.0)
Neutrophils Relative %: 55.1 % (ref 43.0–77.0)
Platelets: 162 10*3/uL (ref 150.0–400.0)
RDW: 12.6 % (ref 11.5–14.6)
WBC: 7.8 10*3/uL (ref 4.5–10.5)

## 2011-03-26 LAB — BASIC METABOLIC PANEL
Calcium: 9 mg/dL (ref 8.4–10.5)
GFR: 81.56 mL/min (ref >60.00)
Potassium: 4.3 mEq/L (ref 3.5–5.1)
Sodium: 140 mEq/L (ref 135–145)

## 2011-03-26 MED ORDER — LOSARTAN POTASSIUM-HCTZ 50-12.5 MG PO TABS: 1.0000 | ORAL_TABLET | Freq: Every day | ORAL | Status: DC

## 2011-03-26 MED ORDER — CITALOPRAM HYDROBROMIDE 40 MG PO TABS: 60.0000 mg | ORAL_TABLET | Freq: Every day | ORAL | Status: DC

## 2011-03-26 MED ORDER — BUPROPION HCL ER (XL) 150 MG PO TB24: 150.0000 mg | ORAL_TABLET | ORAL | Status: DC

## 2011-03-26 NOTE — Assessment & Plan Note (Signed)
Pt's PE WNL.  UTD on health maintenance.  Check labs.  Anticipatory guidance provided.  

## 2011-03-26 NOTE — Assessment & Plan Note (Signed)
Pt not currently on meds.  Was attempting to control w/ diet and exercise.  Check labs and start meds prn.

## 2011-03-26 NOTE — Assessment & Plan Note (Signed)
BP well controlled today.  Asymptomatic.  No changes. 

## 2011-03-26 NOTE — Progress Notes (Signed)
  Subjective:    Patient ID: Alexis Wood, female    DOB: 13-Oct-1955, 55 y.o.   MRN: [id number]  HPI CPE- UTD on pap and mammo w/ Dr Arelia Sneddon.  UTD on colonoscopy- had at age 86.  Anxiety- finds herself easily frustrated at work, driving in traffic.  Prefers to be 'home in the quiet'.  On celexa but feels this is no longer working.   Review of Systems Patient reports no vision/ hearing changes, adenopathy,fever, weight change,  persistant/recurrent hoarseness , swallowing issues, chest pain, palpitations, edema, persistant/recurrent cough, hemoptysis, dyspnea (rest/exertional/paroxysmal nocturnal), gastrointestinal bleeding (melena, rectal bleeding), abdominal pain, significant heartburn, bowel changes, GU symptoms (dysuria, hematuria, incontinence), Gyn symptoms (abnormal  bleeding, pain),  syncope, focal weakness, memory loss, numbness & tingling, skin/hair/nail changes, abnormal bruising or bleeding.     Objective:   Physical Exam  General Appearance:    Alert, cooperative, no distress, appears stated age  Head:    Normocephalic, without obvious abnormality, atraumatic  Eyes:    PERRL, conjunctiva/corneas clear, EOM's intact, fundi    benign, both eyes  Ears:    Normal TM's and external ear canals, both ears  Nose:   Nares normal, septum midline, mucosa normal, no drainage    or sinus tenderness  Throat:   Lips, mucosa, and tongue normal; teeth and gums normal  Neck:   Supple, symmetrical, trachea midline, no adenopathy;    Thyroid: no enlargement/tenderness/nodules  Back:     Symmetric, no curvature, ROM normal, no CVA tenderness  Lungs:     Clear to auscultation bilaterally, respirations unlabored  Chest Wall:    No tenderness or deformity   Heart:    Regular rate and rhythm, S1 and S2 normal, no murmur, rub   or gallop  Breast Exam:    Deferred to GYN  Abdomen:     Soft, non-tender, bowel sounds active all four quadrants,    no masses, no organomegaly  Genitalia:    Deferred  to GYN  Rectal:    GYN  Extremities:   Extremities normal, atraumatic, no cyanosis or edema  Pulses:   2+ and symmetric all extremities  Skin:   Skin color, texture, turgor normal, no rashes or lesions  Lymph nodes:   Cervical, supraclavicular, and axillary nodes normal  Neurologic:   CNII-XII intact, normal strength, sensation and reflexes    throughout          Assessment & Plan:

## 2011-03-26 NOTE — Assessment & Plan Note (Signed)
Check labs.  Adjust meds prn  

## 2011-03-26 NOTE — Assessment & Plan Note (Signed)
Pt admits to increased anxiety and frustration recently.  Already on max dose of celexa.  Add Wellbutrin.  Will follow.

## 2011-03-26 NOTE — Patient Instructions (Signed)
Follow up in 4-6 weeks to recheck mood We'll notify you of your lab results Keep up the good work on diet and exercise Continue the Citalopram Add the Wellbutrin daily Keep trying to cut back on the cigarettes Call with any questions or concerns Have a great summer!!!

## 2011-03-27 LAB — T4, FREE: Free T4: 1.17 ng/dL (ref 0.60–1.60)

## 2011-03-29 ENCOUNTER — Encounter: Payer: Self-pay | Admitting: Family Medicine

## 2011-03-30 LAB — VITAMIN D 1,25 DIHYDROXY: Vitamin D 1, 25 (OH)2 Total: 29 pg/mL (ref 18–72)

## 2011-04-01 ENCOUNTER — Encounter: Payer: Self-pay | Admitting: *Deleted

## 2011-04-01 ENCOUNTER — Encounter: Payer: Self-pay | Admitting: Family Medicine

## 2011-05-03 ENCOUNTER — Encounter: Payer: Self-pay | Admitting: Family Medicine

## 2011-05-03 ENCOUNTER — Ambulatory Visit (INDEPENDENT_AMBULATORY_CARE_PROVIDER_SITE_OTHER): Payer: 59 | Admitting: Family Medicine

## 2011-05-03 DIAGNOSIS — F411 Generalized anxiety disorder: Secondary | ICD-10-CM

## 2011-05-03 NOTE — Patient Instructions (Signed)
Follow up in 6 months to recheck thyroid and cholesterol- do not eat before this appt Call with any questions or concerns You look great!  Keep it up!

## 2011-05-05 NOTE — Assessment & Plan Note (Signed)
sxs much improved since adding Wellbutrin.  Continue current dose.

## 2011-05-05 NOTE — Progress Notes (Signed)
  Subjective:    Patient ID: Alexis Wood, female    DOB: 10/21/1955, 55 y.o.   MRN: [id number]  HPI Depression/anxiety- pt reports feeling 'so much better' since adding Wellbutrin to Celexa.  Less short, less 'on edge'.  Pt very pleased w/ results.  No interest in changing dose at this time.   Review of Systems For ROS see HPI     Objective:   Physical Exam  Vitals reviewed. Constitutional: She appears well-developed and well-nourished.  Psychiatric: She has a normal mood and affect. Her behavior is normal. Judgment and thought content normal.          Assessment & Plan:

## 2011-06-13 ENCOUNTER — Telehealth: Payer: Self-pay | Admitting: *Deleted

## 2011-06-13 NOTE — Telephone Encounter (Signed)
i don't typically recommend OTC metabolism boosters b/c they are not evaluated by the FDA.  If she would like to take either of those supplements that would be fine but they may not be effective.

## 2011-06-13 NOTE — Telephone Encounter (Signed)
Discuss with patient  

## 2011-06-13 NOTE — Telephone Encounter (Signed)
Pt states that she trying to lose weight and would like to know what would be a good supplement that she can take to help jump start weightloss. Pt has looked in to [employer] and green tea.Please advise

## 2011-06-24 ENCOUNTER — Other Ambulatory Visit: Payer: Self-pay | Admitting: Family Medicine

## 2011-06-24 NOTE — Telephone Encounter (Signed)
Last OV 05/03/11. Last filled 03/26/11

## 2011-09-13 ENCOUNTER — Telehealth: Payer: Self-pay | Admitting: Family Medicine

## 2011-09-13 NOTE — Telephone Encounter (Signed)
Spoke to pt to advise results/instructions. Pt understood. Pt advised that she has an appt in feb and will discuss the concern with MD Tabori at this appt

## 2011-09-13 NOTE — Telephone Encounter (Signed)
Patient wants to quite smoking [id number] - she wants to know the best way  -cold [place] or rx [facility]

## 2011-09-13 NOTE — Telephone Encounter (Signed)
She will need an appt to discuss strategies to quit.  Can attempt cold [place] but this is typically difficult.

## 2011-09-17 LAB — HM DEXA SCAN: HM Dexa Scan: NORMAL

## 2011-09-30 ENCOUNTER — Other Ambulatory Visit: Payer: Self-pay | Admitting: Family Medicine

## 2011-09-30 MED ORDER — BUPROPION HCL ER (XL) 150 MG PO TB24: 150.0000 mg | ORAL_TABLET | ORAL | Status: DC

## 2011-09-30 NOTE — Telephone Encounter (Signed)
Ok for 6 months 

## 2011-09-30 NOTE — Telephone Encounter (Signed)
rx sent to pharmacy by e-script  

## 2011-09-30 NOTE — Telephone Encounter (Signed)
Last OV 05-03-11 last refill 06-24-11 #30 with 2 refills

## 2011-11-04 ENCOUNTER — Encounter: Payer: Self-pay | Admitting: Family Medicine

## 2011-11-04 ENCOUNTER — Ambulatory Visit (INDEPENDENT_AMBULATORY_CARE_PROVIDER_SITE_OTHER): Payer: 59 | Admitting: Family Medicine

## 2011-11-04 DIAGNOSIS — E785 Hyperlipidemia, unspecified: Secondary | ICD-10-CM

## 2011-11-04 DIAGNOSIS — J309 Allergic rhinitis, unspecified: Secondary | ICD-10-CM

## 2011-11-04 DIAGNOSIS — I10 Essential (primary) hypertension: Secondary | ICD-10-CM

## 2011-11-04 DIAGNOSIS — E039 Hypothyroidism, unspecified: Secondary | ICD-10-CM

## 2011-11-04 LAB — BASIC METABOLIC PANEL
Calcium: 9.1 mg/dL (ref 8.4–10.5)
GFR: 77.91 mL/min (ref >60.00)
Potassium: 4 mEq/L (ref 3.5–5.1)
Sodium: 139 mEq/L (ref 135–145)

## 2011-11-04 LAB — HEPATIC FUNCTION PANEL
ALT: 17 U/L (ref 0–35)
AST: 16 U/L (ref 0–37)
Albumin: 4.3 g/dL (ref 3.5–5.2)
Total Protein: 7.2 g/dL (ref 6.0–8.3)

## 2011-11-04 LAB — LIPID PANEL
HDL: 72.9 mg/dL (ref >39.00)
Triglycerides: 84 mg/dL (ref 0.0–149.0)

## 2011-11-04 LAB — LDL CHOLESTEROL, DIRECT: Direct LDL: 119.5 mg/dL

## 2011-11-04 NOTE — Assessment & Plan Note (Signed)
New.  Pt likely being exposed to environmental allergen at work as this is where she is having her sxs.  Start OTC antihistamine.  Will continue to follow.

## 2011-11-04 NOTE — Assessment & Plan Note (Signed)
Chronic problem.  On replacement tx.  Check labs.  Adjust meds prn.

## 2011-11-04 NOTE — Assessment & Plan Note (Signed)
Chronic problem.  Adequate control.  Asymptomatic.  No changes. 

## 2011-11-04 NOTE — Patient Instructions (Signed)
Schedule your complete physical in 6 months We'll notify you of your lab results and make any changes if needed Start Claritin or Zyrtec daily for environmental allergies Call with any questions or concerns Happy [place]!!!

## 2011-11-04 NOTE — Progress Notes (Signed)
  Subjective:    Patient ID: Alexis Wood, female    DOB: December 26, 1955, 56 y.o.   MRN: [id number]  HPI HTN- chronic problem, on Hyzaar.  Well controlled today.  No CP, SOB, HAs, visual changes, edema.  Hyperlipidemia- based on last labs.  Attempting to control w/ diet and exercise.  Not currently on meds.  Hypothyroid- chronic problem, on synthroid.  Due for labs.  No heat/cold intolerance, excessive fatigue.  Eye allergies- new.  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  Eyes will tear constantly while working but improve upon leaving the building.  + nasal congestion, cough.  sxs started 1 month ago.  Reports multiple co workers w/ similar sxs.   Review of Systems For ROS see HPI     Objective:   Physical Exam  Vitals reviewed. Constitutional: She is oriented to person, place, and time. She appears well-developed and well-nourished. No distress.  HENT:  Head: Normocephalic and atraumatic.  Right Ear: Tympanic membrane normal.  Left Ear: Tympanic membrane normal.  Nose: Mucosal edema and rhinorrhea present. Right sinus exhibits no maxillary sinus tenderness and no frontal sinus tenderness. Left sinus exhibits no maxillary sinus tenderness and no frontal sinus tenderness.  Mouth/Throat: Mucous membranes are normal. Posterior oropharyngeal erythema (w/ PND) present.  Eyes: Conjunctivae and EOM are normal. Pupils are equal, round, and reactive to light.  Neck: Normal range of motion. Neck supple. No thyromegaly present.  Cardiovascular: Normal rate, regular rhythm, normal heart sounds and intact distal pulses.   No murmur heard. Pulmonary/Chest: Effort normal and breath sounds normal. No respiratory distress. She has no wheezes. She has no rales.  Abdominal: Soft. She exhibits no distension. There is no tenderness.  Musculoskeletal: She exhibits no edema.  Lymphadenopathy:    She has no cervical adenopathy.  Neurological: She is alert and oriented to person, place, and time.    Skin: Skin is warm and dry.  Psychiatric: She has a normal mood and affect. Her behavior is normal.          Assessment & Plan:

## 2011-11-04 NOTE — Assessment & Plan Note (Signed)
Chronic problem.  Has been attempting to control w/ diet and exercise.  Not currently on meds.  Check labs.  Start meds prn.

## 2011-11-05 ENCOUNTER — Encounter: Payer: Self-pay | Admitting: *Deleted

## 2011-11-08 ENCOUNTER — Other Ambulatory Visit: Payer: Self-pay | Admitting: Family Medicine

## 2011-11-08 MED ORDER — LEVOTHYROXINE SODIUM 100 MCG PO TABS: 100.0000 ug | ORAL_TABLET | Freq: Every day | ORAL | Status: DC

## 2011-11-08 NOTE — Telephone Encounter (Signed)
rx sent to pharmacy by e-script for levothyroxine  Spoke to pt per left vm stating she wanted to disccuss her elevated labs from 11-04-11, advised pt that MD Tabori reviewed the labs and the elevation is noted as not enough elevation to change medications at this time, however notes to monitor close to diet and exercise plus the labs drawn post holiday eating. Pt understood.

## 2011-11-12 ENCOUNTER — Telehealth: Payer: Self-pay | Admitting: Family Medicine

## 2011-11-12 MED ORDER — CITALOPRAM HYDROBROMIDE 40 MG PO TABS: 60.0000 mg | ORAL_TABLET | Freq: Every day | ORAL | Status: DC

## 2011-11-12 MED ORDER — LOSARTAN POTASSIUM-HCTZ 50-12.5 MG PO TABS: 1.0000 | ORAL_TABLET | Freq: Every day | ORAL | Status: DC

## 2011-11-12 NOTE — Telephone Encounter (Signed)
Refill: Losartan-hctz 50-12.5 mg tab. Take 1 tablet by mouth daily. Qty 30. Last fill 10-12-11  Refill: Citalopram hbr 40 mg tablet. Take 1 & 1/2 tablets by mouth daily. Last fill 10-12-11

## 2011-11-12 NOTE — Telephone Encounter (Signed)
rx sent to pharmacy by e-script  

## 2012-03-05 ENCOUNTER — Telehealth: Payer: Self-pay | Admitting: Family Medicine

## 2012-03-05 MED ORDER — LEVOTHYROXINE SODIUM 100 MCG PO TABS: 100.0000 ug | ORAL_TABLET | Freq: Every day | ORAL | Status: DC

## 2012-03-05 NOTE — Telephone Encounter (Signed)
rx sent to pharmacy by e-script  

## 2012-03-05 NOTE — Telephone Encounter (Signed)
Refill:Levothyroxine 100 mcg tablet. Take 1 tablet by mouth daily. Qty 30. Last fill 02-02-12

## 2012-04-06 ENCOUNTER — Telehealth: Payer: Self-pay | Admitting: Family Medicine

## 2012-04-06 NOTE — Telephone Encounter (Signed)
Refill: Bupropion hcl xl 150mg  tablet. Take 1 tablet by mouth every morning. Qty 30. Last fill 03-05-12

## 2012-04-07 MED ORDER — BUPROPION HCL ER (XL) 150 MG PO TB24: 150.0000 mg | ORAL_TABLET | ORAL | Status: DC

## 2012-04-07 NOTE — Telephone Encounter (Signed)
rx sent to pharmacy by e-script for #30 with 5 refills per verbal from MD tabori, noted pt overdue for CPE as of 03-26-11, left pt vm to advise she is overdue and needs to call office to schedule CPE.

## 2012-05-04 ENCOUNTER — Ambulatory Visit (INDEPENDENT_AMBULATORY_CARE_PROVIDER_SITE_OTHER): Payer: 59 | Admitting: Family Medicine

## 2012-05-04 ENCOUNTER — Encounter: Payer: Self-pay | Admitting: *Deleted

## 2012-05-04 ENCOUNTER — Encounter: Payer: Self-pay | Admitting: Family Medicine

## 2012-05-04 VITALS — BP 114/76 | HR 100 | Temp 98.2°F | Ht 64.0 in | Wt 163.0 lb

## 2012-05-04 DIAGNOSIS — Z0000 Encounter for general adult medical examination without abnormal findings: Secondary | ICD-10-CM

## 2012-05-04 DIAGNOSIS — E785 Hyperlipidemia, unspecified: Secondary | ICD-10-CM

## 2012-05-04 DIAGNOSIS — E039 Hypothyroidism, unspecified: Secondary | ICD-10-CM

## 2012-05-04 DIAGNOSIS — I10 Essential (primary) hypertension: Secondary | ICD-10-CM

## 2012-05-04 LAB — CBC WITH DIFFERENTIAL/PLATELET
Basophils Absolute: 0 10*3/uL (ref 0.0–0.1)
HCT: 44.3 % (ref 36.0–46.0)
Hemoglobin: 14.9 g/dL (ref 12.0–15.0)
Lymphs Abs: 2.7 10*3/uL (ref 0.7–4.0)
MCHC: 33.7 g/dL (ref 30.0–36.0)
MCV: 89.9 fl (ref 78.0–100.0)
Monocytes Absolute: 0.5 10*3/uL (ref 0.1–1.0)
Monocytes Relative: 5.7 % (ref 3.0–12.0)
Neutro Abs: 4.8 10*3/uL (ref 1.4–7.7)
Platelets: 187 10*3/uL (ref 150.0–400.0)
RDW: 12.7 % (ref 11.5–14.6)

## 2012-05-04 LAB — HEPATIC FUNCTION PANEL
Albumin: 4.3 g/dL (ref 3.5–5.2)
Bilirubin, Direct: 0 mg/dL (ref 0.0–0.3)
Total Protein: 7.4 g/dL (ref 6.0–8.3)

## 2012-05-04 LAB — BASIC METABOLIC PANEL
CO2: 31 mEq/L (ref 19–32)
Calcium: 9.2 mg/dL (ref 8.4–10.5)
Creatinine, Ser: 0.7 mg/dL (ref 0.4–1.2)
GFR: 87.68 mL/min (ref >60.00)

## 2012-05-04 LAB — LIPID PANEL
Cholesterol: 197 mg/dL (ref 0–200)
HDL: 70.7 mg/dL (ref >39.00)
LDL Cholesterol: 107 mg/dL — ABNORMAL HIGH (ref 0–99)
Triglycerides: 98 mg/dL (ref 0.0–149.0)

## 2012-05-04 MED ORDER — BUPROPION HCL ER (XL) 300 MG PO TB24: 300.0000 mg | ORAL_TABLET | Freq: Every day | ORAL | Status: DC

## 2012-05-04 NOTE — Patient Instructions (Signed)
Follow up in 6 months to recheck BP and cholesterol We'll notify you of your lab results and make any changes if needed Increase the Wellbutrin to 300 daily Try and get back to the Gym Call with any questions or concerns Hang in there!! Happy [place]uch!!

## 2012-05-04 NOTE — Progress Notes (Signed)
  Subjective:    Patient ID: Alexis Wood, female    DOB: [birth year], 56 y.o.   MRN: [id number]  HPI CPE- UTD on colonoscopy, GYN- [facility] (UTD on pap, mammo)   Review of Systems Patient reports no vision/ hearing changes, adenopathy,fever, weight change,  persistant/recurrent hoarseness , swallowing issues, chest pain, palpitations, edema, persistant/recurrent cough, hemoptysis, dyspnea (rest/exertional/paroxysmal nocturnal), gastrointestinal bleeding (melena, rectal bleeding), abdominal pain, significant heartburn, bowel changes, GU symptoms (dysuria, hematuria, incontinence), Gyn symptoms (abnormal  bleeding, pain),  syncope, focal weakness, memory loss, numbness & tingling, skin/hair/nail changes, abnormal bruising or bleeding, anxiety, or depression.     Objective:   Physical Exam General Appearance:    Alert, cooperative, no distress, appears stated age  Head:    Normocephalic, without obvious abnormality, atraumatic  Eyes:    PERRL, conjunctiva/corneas clear, EOM's intact, fundi    benign, both eyes  Ears:    Normal TM's and external ear canals, both ears  Nose:   Nares normal, septum midline, mucosa normal, no drainage    or sinus tenderness  Throat:   Lips, mucosa, and tongue normal; teeth and gums normal  Neck:   Supple, symmetrical, trachea midline, no adenopathy;    Thyroid: no enlargement/tenderness/nodules  Back:     Symmetric, no curvature, ROM normal, no CVA tenderness  Lungs:     Clear to auscultation bilaterally, respirations unlabored  Chest Wall:    No tenderness or deformity   Heart:    Regular rate and rhythm, S1 and S2 normal, no murmur, rub   or gallop  Breast Exam:    Deferred to GYN  Abdomen:     Soft, non-tender, bowel sounds active all four quadrants,    no masses, no organomegaly  Genitalia:    Deferred to GYN  Rectal:    Extremities:   Extremities normal, atraumatic, no cyanosis or edema  Pulses:   2+ and symmetric all extremities  Skin:   Skin  color, texture, turgor normal, no rashes or lesions  Lymph nodes:   Cervical, supraclavicular, and axillary nodes normal  Neurologic:   CNII-XII intact, normal strength, sensation and reflexes    throughout          Assessment & Plan:

## 2012-05-05 ENCOUNTER — Other Ambulatory Visit: Payer: Self-pay | Admitting: Family Medicine

## 2012-05-05 NOTE — Assessment & Plan Note (Signed)
Chronic problem.  Check labs.  Adjust meds prn  

## 2012-05-05 NOTE — Telephone Encounter (Signed)
rx sent to pharmacy by e-script  

## 2012-05-05 NOTE — Assessment & Plan Note (Signed)
Pt's PE WNL.  UTD on health maintenance.  Check labs.  Anticipatory guidance provided.  

## 2012-05-05 NOTE — Assessment & Plan Note (Signed)
Chronic problem.  Well controlled.  Asymptomatic. 

## 2012-05-07 ENCOUNTER — Encounter: Payer: Self-pay | Admitting: *Deleted

## 2012-05-07 LAB — VITAMIN D 1,25 DIHYDROXY: Vitamin D2 1, 25 (OH)2: <8 pg/mL

## 2012-06-22 ENCOUNTER — Other Ambulatory Visit: Payer: Self-pay | Admitting: Family Medicine

## 2012-06-22 MED ORDER — CITALOPRAM HYDROBROMIDE 40 MG PO TABS: 60.0000 mg | ORAL_TABLET | Freq: Every day | ORAL | Status: DC

## 2012-06-22 MED ORDER — LOSARTAN POTASSIUM-HCTZ 50-12.5 MG PO TABS: 1.0000 | ORAL_TABLET | Freq: Every day | ORAL | Status: DC

## 2012-06-22 NOTE — Telephone Encounter (Signed)
rx sent to pharmacy by e-script  

## 2012-06-22 NOTE — Telephone Encounter (Signed)
Refills x  2  Last ov V70.9 8.19.13  1-losartan - hctz 50-12.5 mg tab #30 wt/6-refills take one tablet by mouth daily -- last fill 9.5.13  2-citalopram hbr 40 mg tablet #45 wt/6-refills take 1 & 1/2 tablets by mouth daily  -- last fill 9.5.13

## 2012-11-05 ENCOUNTER — Encounter: Payer: Self-pay | Admitting: Family Medicine

## 2012-11-05 ENCOUNTER — Ambulatory Visit (INDEPENDENT_AMBULATORY_CARE_PROVIDER_SITE_OTHER): Payer: 59 | Admitting: Family Medicine

## 2012-11-05 VITALS — BP 136/90 | HR 105 | Temp 97.9°F | Ht 63.25 in | Wt 165.8 lb

## 2012-11-05 DIAGNOSIS — E785 Hyperlipidemia, unspecified: Secondary | ICD-10-CM

## 2012-11-05 DIAGNOSIS — I10 Essential (primary) hypertension: Secondary | ICD-10-CM

## 2012-11-05 DIAGNOSIS — F17200 Nicotine dependence, unspecified, uncomplicated: Secondary | ICD-10-CM

## 2012-11-05 MED ORDER — VARENICLINE TARTRATE 0.5 MG X 11 & 1 MG X 42 PO MISC: ORAL | Status: DC

## 2012-11-05 MED ORDER — VARENICLINE TARTRATE 1 MG PO TABS: 1.0000 mg | ORAL_TABLET | Freq: Two times a day (BID) | ORAL | Status: DC

## 2012-11-05 MED ORDER — LEVOTHYROXINE SODIUM 100 MCG PO TABS: ORAL_TABLET | ORAL | Status: DC

## 2012-11-05 NOTE — Patient Instructions (Addendum)
Schedule your complete physical in 6 months We'll notify you of your lab results and make any changes if needed Make exercise a part of your routine Start the Chantix and set your quit date for 1 week after starting Call with any questions or concerns You can do this!

## 2012-11-05 NOTE — Assessment & Plan Note (Signed)
Chronic problem.  Attempting to control w/ diet and exercise.  Check labs.  Start meds if not adequately controlled.  Pt expressed understanding and is in agreement w/ plan.

## 2012-11-05 NOTE — Assessment & Plan Note (Signed)
Chronic problem.  Pt feels she is ready to attempt quitting.  Wants to restart chantix.  Has done well on this previously w/out psychiatric side effects.  Will follow.

## 2012-11-05 NOTE — Assessment & Plan Note (Signed)
Chronic problem, elevated today.  Asymptomatic.  Pt reports smoking cigarette just prior to appt.  Again encouraged smoking cessation (see below).  Will follow.

## 2012-11-05 NOTE — Progress Notes (Signed)
  Subjective:    Patient ID: Alexis Wood, female    DOB: 01/14/1956, 57 y.o.   MRN: [id number]  HPI HTN- chronic problem, slightly elevated today.  On Hyzaar daily.  Denies increased stress.  No CP, SOB, HAs, visual changes, edema.  Hyperlipidemia- chronic problem, but not currently on meds.  Trying to control w/ diet and exercise.  Smoking- wants to quit.  'i think i'm ready'.  Has done chantix before and did well w/ this.  Would like to retry this.  Fears gaining weight.   Review of Systems For ROS see HPI     Objective:   Physical Exam  Vitals reviewed. Constitutional: She is oriented to person, place, and time. She appears well-developed and well-nourished. No distress.  HENT:  Head: Normocephalic and atraumatic.  Eyes: Conjunctivae and EOM are normal. Pupils are equal, round, and reactive to light.  Neck: Normal range of motion. Neck supple. No thyromegaly present.  Cardiovascular: Normal rate, regular rhythm, normal heart sounds and intact distal pulses.   No murmur heard. Pulmonary/Chest: Effort normal and breath sounds normal. No respiratory distress.  Abdominal: Soft. She exhibits no distension. There is no tenderness.  Musculoskeletal: She exhibits no edema.  Lymphadenopathy:    She has no cervical adenopathy.  Neurological: She is alert and oriented to person, place, and time.  Skin: Skin is warm and dry.  Psychiatric: She has a normal mood and affect. Her behavior is normal.          Assessment & Plan:

## 2012-11-06 LAB — BASIC METABOLIC PANEL
BUN: 16 mg/dL (ref 6–23)
Chloride: 100 mEq/L (ref 96–112)
GFR: 47.49 mL/min — ABNORMAL LOW (ref >60.00)
Potassium: 3.5 mEq/L (ref 3.5–5.1)
Sodium: 138 mEq/L (ref 135–145)

## 2012-11-06 LAB — LIPID PANEL
Cholesterol: 182 mg/dL (ref 0–200)
HDL: 59 mg/dL (ref >39.00)
Total CHOL/HDL Ratio: 3
Triglycerides: 354 mg/dL — ABNORMAL HIGH (ref 0.0–149.0)
VLDL: 70.8 mg/dL — ABNORMAL HIGH (ref 0.0–40.0)

## 2012-11-06 LAB — HEPATIC FUNCTION PANEL
ALT: 28 U/L (ref 0–35)
Bilirubin, Direct: 0 mg/dL (ref 0.0–0.3)
Total Protein: 7.1 g/dL (ref 6.0–8.3)

## 2012-11-10 ENCOUNTER — Telehealth: Payer: Self-pay | Admitting: *Deleted

## 2012-11-10 DIAGNOSIS — E785 Hyperlipidemia, unspecified: Secondary | ICD-10-CM

## 2012-11-10 NOTE — Telephone Encounter (Signed)
Spoke with the pt and informed her of recent lab results and note.  Pt understood and agreed.  Pt will call back to schedule an lab appt.  Lab order put in.//AB/CMA

## 2012-11-10 NOTE — Telephone Encounter (Signed)
Message copied by Verdie Shire on Tue Nov 10, 2012  5:32 PM ------      Message from: Sheliah Hatch      Created: Fri Nov 06, 2012  5:10 PM       Labs look good w/ the exception of mildly elevated Cr- she should increase her water intake- and trigs.  They are more than triple what they last were.  This can be elevated based on what she ate prior to appt.  Should schedule fasting lab visit in 3 months (dx 272.4) ------

## 2012-12-06 ENCOUNTER — Other Ambulatory Visit: Payer: Self-pay | Admitting: Family Medicine

## 2013-01-24 ENCOUNTER — Other Ambulatory Visit: Payer: Self-pay | Admitting: Family Medicine

## 2013-01-25 NOTE — Telephone Encounter (Signed)
Rx sent to the pharmacy by e-script.//AB/CMA 

## 2013-02-09 ENCOUNTER — Other Ambulatory Visit: Payer: Self-pay | Admitting: Obstetrics and Gynecology

## 2013-02-09 DIAGNOSIS — R928 Other abnormal and inconclusive findings on diagnostic imaging of breast: Secondary | ICD-10-CM

## 2013-02-23 ENCOUNTER — Other Ambulatory Visit: Payer: Self-pay | Admitting: Obstetrics and Gynecology

## 2013-02-23 ENCOUNTER — Ambulatory Visit
Admission: RE | Admit: 2013-02-23 | Discharge: 2013-02-23 | Disposition: A | Discharge status: Home / Self Care | Payer: 59 | Source: Ambulatory Visit | Attending: Obstetrics and Gynecology | Admitting: Obstetrics and Gynecology

## 2013-02-23 DIAGNOSIS — R928 Other abnormal and inconclusive findings on diagnostic imaging of breast: Secondary | ICD-10-CM

## 2013-03-01 ENCOUNTER — Other Ambulatory Visit: Payer: 59

## 2013-03-03 ENCOUNTER — Ambulatory Visit
Admission: RE | Admit: 2013-03-03 | Discharge: 2013-03-03 | Disposition: A | Discharge status: Home / Self Care | Payer: 59 | Source: Ambulatory Visit | Attending: Obstetrics and Gynecology | Admitting: Obstetrics and Gynecology

## 2013-03-03 DIAGNOSIS — R928 Other abnormal and inconclusive findings on diagnostic imaging of breast: Secondary | ICD-10-CM

## 2013-05-05 ENCOUNTER — Ambulatory Visit (INDEPENDENT_AMBULATORY_CARE_PROVIDER_SITE_OTHER): Payer: 59 | Admitting: Family Medicine

## 2013-05-05 ENCOUNTER — Other Ambulatory Visit: Payer: Self-pay | Admitting: Family Medicine

## 2013-05-05 ENCOUNTER — Encounter: Payer: Self-pay | Admitting: Family Medicine

## 2013-05-05 VITALS — BP 122/84 | HR 88 | Temp 98.7°F | Ht 63.25 in | Wt 151.8 lb

## 2013-05-05 DIAGNOSIS — F411 Generalized anxiety disorder: Secondary | ICD-10-CM

## 2013-05-05 DIAGNOSIS — Z1331 Encounter for screening for depression: Secondary | ICD-10-CM

## 2013-05-05 DIAGNOSIS — I10 Essential (primary) hypertension: Secondary | ICD-10-CM

## 2013-05-05 DIAGNOSIS — Z0000 Encounter for general adult medical examination without abnormal findings: Secondary | ICD-10-CM

## 2013-05-05 LAB — BASIC METABOLIC PANEL
CO2: 31 mEq/L (ref 19–32)
Calcium: 9 mg/dL (ref 8.4–10.5)
Chloride: 101 mEq/L (ref 96–112)
Creatinine, Ser: 0.8 mg/dL (ref 0.4–1.2)
Glucose, Bld: 91 mg/dL (ref 70–99)

## 2013-05-05 LAB — CBC WITH DIFFERENTIAL/PLATELET
Eosinophils Relative: 2.6 % (ref 0.0–5.0)
HCT: 44 % (ref 36.0–46.0)
Hemoglobin: 15.1 g/dL — ABNORMAL HIGH (ref 12.0–15.0)
Lymphocytes Relative: 22.9 % (ref 12.0–46.0)
Lymphs Abs: 2.8 10*3/uL (ref 0.7–4.0)
Monocytes Relative: 4.4 % (ref 3.0–12.0)
Neutro Abs: 8.5 10*3/uL — ABNORMAL HIGH (ref 1.4–7.7)
RBC: 4.95 Mil/uL (ref 3.87–5.11)
WBC: 12.1 10*3/uL — ABNORMAL HIGH (ref 4.5–10.5)

## 2013-05-05 LAB — LIPID PANEL
Cholesterol: 177 mg/dL (ref 0–200)
LDL Cholesterol: 104 mg/dL — ABNORMAL HIGH (ref 0–99)
Total CHOL/HDL Ratio: 3
Triglycerides: 92 mg/dL (ref 0.0–149.0)

## 2013-05-05 LAB — HEPATIC FUNCTION PANEL
ALT: 18 U/L (ref 0–35)
Albumin: 4 g/dL (ref 3.5–5.2)
Alkaline Phosphatase: 65 U/L (ref 39–117)
Total Protein: 7.1 g/dL (ref 6.0–8.3)

## 2013-05-05 MED ORDER — BUPROPION HCL ER (XL) 300 MG PO TB24: ORAL_TABLET | ORAL | Status: DC

## 2013-05-05 NOTE — Assessment & Plan Note (Signed)
Pt's PE WNL.  UTD on health maintenance.  Check labs.  Anticipatory guidance provided.  

## 2013-05-05 NOTE — Progress Notes (Signed)
  Subjective:    Patient ID: Alexis Wood, female    DOB: Jun 17, 1956, 57 y.o.   MRN: [id number]  HPI CPE- UTD on GYN, colonoscopy.  Anxiety- pt ran out of wellbutrin last week and did not refill it.  Remains on celexa.  'i'm fine through the week but it's just Saturdays'.  'i want something strong'.   Review of Systems Patient reports no vision/ hearing changes, adenopathy,fever, weight change,  persistant/recurrent hoarseness , swallowing issues, chest pain, palpitations, edema, persistant/recurrent cough, hemoptysis, dyspnea (rest/exertional/paroxysmal nocturnal), gastrointestinal bleeding (melena, rectal bleeding), abdominal pain, significant heartburn, bowel changes, GU symptoms (dysuria, hematuria, incontinence), Gyn symptoms (abnormal  bleeding, pain),  syncope, focal weakness, memory loss, numbness & tingling, skin/hair/nail changes, abnormal bruising or bleeding, anxiety, or depression.     Objective:   Physical Exam General Appearance:    Alert, cooperative, no distress, appears stated age  Head:    Normocephalic, without obvious abnormality, atraumatic  Eyes:    PERRL, conjunctiva/corneas clear, EOM's intact, fundi    benign, both eyes  Ears:    Normal TM's and external ear canals, both ears  Nose:   Nares normal, septum midline, mucosa normal, no drainage    or sinus tenderness  Throat:   Lips, mucosa, and tongue normal; teeth and gums normal  Neck:   Supple, symmetrical, trachea midline, no adenopathy;    Thyroid: no enlargement/tenderness/nodules  Back:     Symmetric, no curvature, ROM normal, no CVA tenderness  Lungs:     Clear to auscultation bilaterally, respirations unlabored  Chest Wall:    No tenderness or deformity   Heart:    Regular rate and rhythm, S1 and S2 normal, no murmur, rub   or gallop  Breast Exam:    Deferred to GYN  Abdomen:     Soft, non-tender, bowel sounds active all four quadrants,    no masses, no organomegaly  Genitalia:    Deferred to GYN   Rectal:    Extremities:   Extremities normal, atraumatic, no cyanosis or edema  Pulses:   2+ and symmetric all extremities  Skin:   Skin color, texture, turgor normal, no rashes or lesions  Lymph nodes:   Cervical, supraclavicular, and axillary nodes normal  Neurologic:   CNII-XII intact, normal strength, sensation and reflexes    throughout          Assessment & Plan:

## 2013-05-05 NOTE — Assessment & Plan Note (Signed)
Deteriorated.  Reviewed that there is no magic pill to make Saturdays w/ mom better.  Refill Wellbutrin and continue Celexa.  Will continue to follow.

## 2013-05-05 NOTE — Patient Instructions (Signed)
Follow up in 6 months to recheck BP We'll notify you of your lab results and make any changes if needed Keep up the good work!  You look great! Call with any questions or concerns Happy Early Birthday!!! 

## 2013-05-05 NOTE — Telephone Encounter (Signed)
Med filled.  

## 2013-05-05 NOTE — Assessment & Plan Note (Signed)
Pt would like to stop BP meds.  Will give 3 month trial off meds and pt to schedule BP f/u.

## 2013-05-06 ENCOUNTER — Telehealth: Payer: Self-pay | Admitting: Family Medicine

## 2013-05-06 ENCOUNTER — Encounter: Payer: Self-pay | Admitting: *Deleted

## 2013-05-06 NOTE — Telephone Encounter (Signed)
Patient states that she missed a call yesterday about her lab results. Please advise.

## 2013-05-06 NOTE — Telephone Encounter (Signed)
Spoke with patient concerning labs. Pt would like lab mailed to her.  Results mailed   SW, CMA

## 2013-05-10 LAB — VITAMIN D 1,25 DIHYDROXY
Vitamin D2 1, 25 (OH)2: <8 pg/mL
Vitamin D3 1, 25 (OH)2: 27 pg/mL

## 2013-06-22 ENCOUNTER — Encounter: Payer: Self-pay | Admitting: Family Medicine

## 2013-07-22 ENCOUNTER — Other Ambulatory Visit: Payer: Self-pay

## 2013-07-26 ENCOUNTER — Telehealth: Payer: Self-pay | Admitting: Family Medicine

## 2013-07-26 NOTE — Telephone Encounter (Signed)
Yes- can fax readings and we'll decide

## 2013-07-26 NOTE — Telephone Encounter (Signed)
Patient called and stated that she has an upcoming apt for a blood pressure check but she states that a nurse from her job has been checking her blood pressure. Patient was wondering if she could cancel the apt and just fax over there reading and let dr Beverely Low decided from there. Thanks

## 2013-07-27 NOTE — Telephone Encounter (Signed)
Called pt again on both numbers listed. LMOVM to return call.

## 2013-07-27 NOTE — Telephone Encounter (Signed)
Called pt unable to leave a message due to voicemail not being set up. Will call later.

## 2013-08-02 NOTE — Telephone Encounter (Signed)
Pt sent in readings.

## 2013-08-05 NOTE — Telephone Encounter (Signed)
Received fax regarding pt BP readings. Per Beverely Low everything looks great no need to restart medications. Pt notified.

## 2013-08-06 ENCOUNTER — Ambulatory Visit: Payer: 59

## 2013-08-30 ENCOUNTER — Other Ambulatory Visit: Payer: Self-pay | Admitting: Family Medicine

## 2013-08-31 NOTE — Telephone Encounter (Signed)
Med filled.  

## 2013-11-08 ENCOUNTER — Ambulatory Visit: Payer: 59 | Admitting: Family Medicine

## 2013-11-12 ENCOUNTER — Other Ambulatory Visit: Payer: Self-pay | Admitting: Family Medicine

## 2013-11-12 NOTE — Telephone Encounter (Signed)
Med filled.  

## 2013-11-23 ENCOUNTER — Telehealth: Payer: Self-pay | Admitting: *Deleted

## 2013-11-23 NOTE — Telephone Encounter (Signed)
Called home number and spoke with pts husband, left message for pt to return my call regarding a med refill she needs for her mother.

## 2013-11-25 NOTE — Telephone Encounter (Signed)
No call back, closing encounter.

## 2014-02-14 ENCOUNTER — Encounter: Payer: Self-pay | Admitting: Gynecology

## 2014-02-23 ENCOUNTER — Encounter: Payer: Self-pay | Admitting: Gynecology

## 2014-03-09 ENCOUNTER — Encounter: Payer: Self-pay | Admitting: Gynecology

## 2014-03-09 ENCOUNTER — Ambulatory Visit (INDEPENDENT_AMBULATORY_CARE_PROVIDER_SITE_OTHER): Payer: 59 | Admitting: Gynecology

## 2014-03-09 VITALS — BP 120/80 | HR 70 | Resp 14 | Ht 65.0 in | Wt 153.0 lb

## 2014-03-09 DIAGNOSIS — F17200 Nicotine dependence, unspecified, uncomplicated: Secondary | ICD-10-CM

## 2014-03-09 DIAGNOSIS — Z01419 Encounter for gynecological examination (general) (routine) without abnormal findings: Secondary | ICD-10-CM

## 2014-03-09 NOTE — Progress Notes (Signed)
58 y.o. Married Caucasian female  G1P1. here for annual exam. Pt reports menses are absent due to Menopause. She occassionally report hot flashes, does have night sweats, does not have vaginal dryness.  She is not using lubricants.  She does not report post-menopasual bleeding.  Pt recently started on zoloft for anxiety.   Patient's last menstrual period was 03/09/2004.          Sexually active: no  The current method of family planning is post menopausal status.    Exercising: yes  treadmill 5x/wk Last pap: 02/03/13 Negative Abnormal PAP: no Mammogram: 02/23/13 Bi-Rads 0, 03/03/13 US Breast Bi-Rads 2   BSE: no Colonoscopy: 7 years ago normal f/u in 10 years DEXA: 2013  Alcohol: no Tobacco: 1/2 pack qd  Labs: Lendell CapriceFelecia James, MD  Health Maintenance  Topic Date Due  . Pap Smear  02/14/2013  . Influenza Vaccine  04/16/2014  . Tetanus/tdap  09/17/2014  . Mammogram  02/24/2015  . Colonoscopy  02/14/2018    Family History  Problem Relation Age of Onset  . Coronary artery disease Mother   . Coronary artery disease Father   . Hypertension Mother   . Hypertension Father   . Diabetes Father   . Dementia Mother   . Breast cancer Maternal Aunt   . Diabetes type I      Patient Active Problem List   Diagnosis Date Noted  . Allergic rhinitis due to allergen 11/04/2011  . General medical examination 03/26/2011  . HYPERLIPIDEMIA 10/01/2010  . OVERWEIGHT 10/01/2010  . MACULAR DEGENERATION 03/09/2010  . HYPOTHYROIDISM 02/13/2010  . ANXIETY 02/13/2010  . TOBACCO ABUSE 02/13/2010  . CARPAL TUNNEL SYNDROME, RIGHT 02/13/2010  . HYPERTENSION 02/13/2010  . OSTEOARTHRITIS 02/13/2010    Past Medical History  Diagnosis Date  . Anxiety   . Hypertension   . Hypothyroidism   . Carpal tunnel syndrome   . Osteoarthritis     right thumb  . Macular degeneration   . Hyperlipidemia     Past Surgical History  Procedure Laterality Date  . Thyroidectomy  Age 58    Allergies: Penicillins and  Pristiq  Current Outpatient Prescriptions  Medication Sig Dispense Refill  . DENTA 5000 PLUS 1.1 % CREA dental cream       . levothyroxine (SYNTHROID, LEVOTHROID) 100 MCG tablet TAKE 1 TABLET (100 MCG TOTAL) BY MOUTH DAILY.  30 tablet  1  . Multiple Vitamins-Minerals (EYE VITAMINS PO) Take by mouth.      . sertraline (ZOLOFT) 25 MG tablet       . Omega 3-Lutein-Zeaxanthin (GNP ADVANCED EYE HEALTH PO) Take by mouth daily.         No current facility-administered medications for this visit.    ROS: Pertinent items are noted in HPI.  Exam:    BP 120/80  Pulse 70  Resp 14  Ht 5\' 5"  (1.651 m)  Wt 153 lb (69.4 kg)  BMI 25.46 kg/m2  LMP 03/09/2004 Weight change: @WEIGHTCHANGE @ Last 3 height recordings:  Ht Readings from Last 3 Encounters:  03/09/14 5\' 5"  (1.651 m)  05/05/13 5' 3.25" (1.607 m)  11/05/12 5' 3.25" (1.607 m)   General appearance: alert, cooperative and appears stated age Head: Normocephalic, without obvious abnormality, atraumatic Neck: no adenopathy, no carotid bruit, no JVD, supple, symmetrical, trachea midline and thyroid absent  Lungs: clear to auscultation bilaterally Breasts: normal appearance, no masses or tenderness Heart: regular rate and rhythm, S1, S2 normal, no murmur, click, rub or gallop Abdomen: soft, non-tender;  bowel sounds normal; no masses,  no organomegaly Extremities: extremities normal, atraumatic, no cyanosis or edema Skin: Skin color, texture, turgor normal. No rashes or lesions Lymph nodes: Cervical, supraclavicular, and axillary nodes normal. no inguinal nodes palpated Neurologic: Grossly normal   Pelvic: External genitalia:  no lesions              Urethra: normal appearing urethra with no masses, tenderness or lesions              Bartholins and Skenes: normal                 Vagina: normal appearing vagina with normal color and discharge, no lesions              Cervix: normal appearance              Pap taken: no        Bimanual  Exam:  Uterus:  uterus is normal size, shape, consistency and nontender                                      Adnexa:    normal adnexa in size, nontender and no masses                                      Rectovaginal: Confirms                                      Anus:  normal sphincter tone, no lesions      1. Routine gynecological examination counseled on breast self exam, mammography screening return annually or prn Discussed PAP guideline changes, importance of weight bearing exercises, calcium, vit D and balanced diet  2. Smoker Had quit with chantix in past but did not work after restarting medication aWare of risk factors associated with smoking Not yet motivated to quit  Record review from prior gyn, additional PAP and DEXA results requested   An After Visit Summary was printed and given to the patient.

## 2014-03-09 NOTE — Patient Instructions (Signed)

## 2014-03-10 ENCOUNTER — Other Ambulatory Visit: Payer: Self-pay

## 2014-03-10 DIAGNOSIS — Z1231 Encounter for screening mammogram for malignant neoplasm of breast: Secondary | ICD-10-CM

## 2014-03-11 ENCOUNTER — Ambulatory Visit
Admission: RE | Admit: 2014-03-11 | Discharge: 2014-03-11 | Disposition: A | Discharge status: Home / Self Care | Payer: 59 | Source: Ambulatory Visit

## 2014-03-11 ENCOUNTER — Ambulatory Visit: Payer: 59

## 2014-03-11 DIAGNOSIS — Z1231 Encounter for screening mammogram for malignant neoplasm of breast: Secondary | ICD-10-CM

## 2014-03-15 ENCOUNTER — Encounter: Payer: Self-pay | Admitting: Gynecology

## 2014-07-18 ENCOUNTER — Encounter: Payer: Self-pay | Admitting: Gynecology

## 2015-01-12 ENCOUNTER — Encounter: Payer: Self-pay | Admitting: Family Medicine

## 2015-01-12 ENCOUNTER — Ambulatory Visit (INDEPENDENT_AMBULATORY_CARE_PROVIDER_SITE_OTHER): Payer: 59 | Admitting: Family Medicine

## 2015-01-12 ENCOUNTER — Other Ambulatory Visit: Payer: Self-pay | Admitting: Family Medicine

## 2015-01-12 VITALS — BP 130/78 | HR 93 | Temp 98.0°F | Resp 16 | Ht 64.25 in | Wt 172.5 lb

## 2015-01-12 DIAGNOSIS — I10 Essential (primary) hypertension: Secondary | ICD-10-CM

## 2015-01-12 DIAGNOSIS — E038 Other specified hypothyroidism: Secondary | ICD-10-CM | POA: Diagnosis not present

## 2015-01-12 DIAGNOSIS — F411 Generalized anxiety disorder: Secondary | ICD-10-CM | POA: Diagnosis not present

## 2015-01-12 LAB — CBC WITH DIFFERENTIAL/PLATELET
BASOS PCT: 0.5 % (ref 0.0–3.0)
Basophils Absolute: 0.1 10*3/uL (ref 0.0–0.1)
EOS PCT: 5 % (ref 0.0–5.0)
Eosinophils Absolute: 0.6 10*3/uL (ref 0.0–0.7)
HEMATOCRIT: 46.1 % — AB (ref 36.0–46.0)
Hemoglobin: 15.7 g/dL — ABNORMAL HIGH (ref 12.0–15.0)
LYMPHS PCT: 35.3 % (ref 12.0–46.0)
Lymphs Abs: 4 10*3/uL (ref 0.7–4.0)
MCHC: 34.1 g/dL (ref 30.0–36.0)
MCV: 87.1 fl (ref 78.0–100.0)
MONOS PCT: 4.7 % (ref 3.0–12.0)
Monocytes Absolute: 0.5 10*3/uL (ref 0.1–1.0)
NEUTROS ABS: 6.1 10*3/uL (ref 1.4–7.7)
NEUTROS PCT: 54.5 % (ref 43.0–77.0)
Platelets: 187 10*3/uL (ref 150.0–400.0)
RBC: 5.29 Mil/uL — AB (ref 3.87–5.11)
RDW: 13.1 % (ref 11.5–15.5)
WBC: 11.3 10*3/uL — AB (ref 4.0–10.5)

## 2015-01-12 LAB — BASIC METABOLIC PANEL
BUN: 17 mg/dL (ref 6–23)
CALCIUM: 10 mg/dL (ref 8.4–10.5)
CO2: 30 meq/L (ref 19–32)
CREATININE: 0.84 mg/dL (ref 0.40–1.20)
Chloride: 101 mEq/L (ref 96–112)
GFR: 73.86 mL/min (ref >60.00)
GLUCOSE: 91 mg/dL (ref 70–99)
Potassium: 4.1 mEq/L (ref 3.5–5.1)
Sodium: 138 mEq/L (ref 135–145)

## 2015-01-12 LAB — HEPATIC FUNCTION PANEL
ALK PHOS: 96 U/L (ref 39–117)
ALT: 36 U/L — ABNORMAL HIGH (ref 0–35)
AST: 24 U/L (ref 0–37)
Albumin: 4.9 g/dL (ref 3.5–5.2)
Bilirubin, Direct: 0 mg/dL (ref 0.0–0.3)
Total Bilirubin: 0.3 mg/dL (ref 0.2–1.2)
Total Protein: 7.7 g/dL (ref 6.0–8.3)

## 2015-01-12 LAB — TSH: TSH: 10.36 u[IU]/mL — AB (ref 0.35–4.50)

## 2015-01-12 LAB — LIPID PANEL
Cholesterol: 207 mg/dL — ABNORMAL HIGH (ref 0–200)
HDL: 72.7 mg/dL (ref >39.00)
LDL CALC: 95 mg/dL (ref 0–99)
NonHDL: 134.3
TRIGLYCERIDES: 195 mg/dL — AB (ref 0.0–149.0)
Total CHOL/HDL Ratio: 3
VLDL: 39 mg/dL (ref 0.0–40.0)

## 2015-01-12 MED ORDER — BUPROPION HCL ER (XL) 150 MG PO TB24: 150.0000 mg | ORAL_TABLET | Freq: Every day | ORAL | Status: DC

## 2015-01-12 MED ORDER — ALPRAZOLAM 0.5 MG PO TABS: 0.5000 mg | ORAL_TABLET | Freq: Two times a day (BID) | ORAL | Status: DC | PRN

## 2015-01-12 MED ORDER — LEVOTHYROXINE SODIUM 125 MCG PO TABS: 125.0000 ug | ORAL_TABLET | Freq: Every day | ORAL | Status: DC

## 2015-01-12 NOTE — Progress Notes (Signed)
   Subjective:    Patient ID: Alexis ConradiKaren F Murad, female    DOB: 1956/08/28, 59 y.o.   MRN: [id number]  HPI HTN- chronic problem.  On Losartan.  No CP, SOB, HAs, visual changes, edema.  Hypothyroid- chronic problem, on Synthroid 100mcg daily.  + fatigue, weight gain.  Depression/anxiety- chronic problem.  Pt is supposed to be taking Zoloft 100mg  daily but reports she is not taking b/c previous MD was not filling.  Pt reports weight gain since father passed away.  + fatigue.  Pt was previously on Wellbutrin.  Mom now [place] and has severe dementia.   Review of Systems For ROS see HPI     Objective:   Physical Exam  Constitutional: She is oriented to person, place, and time. She appears well-developed and well-nourished. No distress.  HENT:  Head: Normocephalic and atraumatic.  Eyes: Conjunctivae and EOM are normal. Pupils are equal, round, and reactive to light.  Neck: Normal range of motion. Neck supple. No thyromegaly present.  Cardiovascular: Normal rate, regular rhythm, normal heart sounds and intact distal pulses.   No murmur heard. Pulmonary/Chest: Effort normal and breath sounds normal. No respiratory distress.  Abdominal: Soft. She exhibits no distension. There is no tenderness.  Musculoskeletal: She exhibits no edema.  Lymphadenopathy:    She has no cervical adenopathy.  Neurological: She is alert and oriented to person, place, and time.  Skin: Skin is warm and dry.  Psychiatric: Her behavior is normal.  Tearful, anxious  Vitals reviewed.         Assessment & Plan:

## 2015-01-12 NOTE — Assessment & Plan Note (Signed)
Chronic problem.  Pt continues to have fatigue, some weight gain.  Unclear if this is related to her untreated depression or if this is thyroid related.  Check labs.  Adjust meds prn

## 2015-01-12 NOTE — Progress Notes (Signed)
Pre visit review using our clinic review tool, if applicable. No additional management support is needed unless otherwise documented below in the visit note. 

## 2015-01-12 NOTE — Assessment & Plan Note (Signed)
Deteriorated.  Pt's sxs are not well controlled since stopping Zoloft.  Pt did well on Wellbutrin previously and given her recent fatigue, weight gain- will restart

## 2015-01-12 NOTE — Assessment & Plan Note (Signed)
Chronic problem.  Well controlled.  Asymptomatic.  Check labs.  No anticipated med changes. 

## 2015-01-12 NOTE — Patient Instructions (Signed)
Follow up in 1 month to recheck mood We'll notify you of your lab results and make any changes if needed Start the Wellbutrin daily for the mood Try and get regular exercise- this is a great stress outlet and will help with the weight gain Call with any questions or concerns Welcome Back!!

## 2015-02-10 ENCOUNTER — Encounter: Payer: Self-pay | Admitting: Family Medicine

## 2015-02-10 ENCOUNTER — Ambulatory Visit (INDEPENDENT_AMBULATORY_CARE_PROVIDER_SITE_OTHER): Payer: 59 | Admitting: Family Medicine

## 2015-02-10 VITALS — BP 130/78 | HR 105 | Temp 98.0°F | Resp 16 | Wt 174.1 lb

## 2015-02-10 DIAGNOSIS — F411 Generalized anxiety disorder: Secondary | ICD-10-CM | POA: Diagnosis not present

## 2015-02-10 DIAGNOSIS — E038 Other specified hypothyroidism: Secondary | ICD-10-CM | POA: Diagnosis not present

## 2015-02-10 NOTE — Assessment & Plan Note (Addendum)
Improved since starting Wellbutrin.  Pt reports she is not interested in increasing dose at this time.  Pt reports she is less stressed and feeling better.  Will follow.

## 2015-02-10 NOTE — Patient Instructions (Signed)
Schedule your complete physical at your convenience We'll notify you of your lab results and make any changes if needed Keep up the good work!  You look great! Call with any questions or concerns Have a great weekend!!!

## 2015-02-10 NOTE — Progress Notes (Signed)
Pre visit review using our clinic review tool, if applicable. No additional management support is needed unless otherwise documented below in the visit note. 

## 2015-02-10 NOTE — Progress Notes (Signed)
   Subjective:    Patient ID: Alexis Wood, female    DOB: February 15, 1956, 59 y.o.   MRN: [id number]  HPI Anxiety- pt started on Wellbutrin at last visit.  Pt has only required 4 alprazolam since last visit.  Pt reports feeling 'so much better'.  Pt reports sleeping better, feeling less wound up since last visit.  Hypothyroid- pt's synthroid was increased to 125mcg at last visit.  Due for repeat lab today.  Pt reports energy level has improved.  Denies palpitations, SOB.   Review of Systems For ROS see HPI     Objective:   Physical Exam  Constitutional: She is oriented to person, place, and time. She appears well-developed and well-nourished. No distress.  HENT:  Head: Normocephalic and atraumatic.  Eyes: Conjunctivae and EOM are normal. Pupils are equal, round, and reactive to light.  Neck: Normal range of motion. Neck supple.  Cardiovascular: Normal rate, regular rhythm, normal heart sounds and intact distal pulses.   Pulmonary/Chest: Effort normal and breath sounds normal. No respiratory distress. She has no wheezes. She has no rales.  Neurological: She is alert and oriented to person, place, and time.  Skin: Skin is warm and dry.  Psychiatric: She has a normal mood and affect. Her behavior is normal. Thought content normal.  Vitals reviewed.         Assessment & Plan:

## 2015-02-10 NOTE — Assessment & Plan Note (Signed)
Chronic problem.  TSH was elevated at last visit and levothyroxine was increased.  Pt reports feeling much better since med change.  Repeat TSH today.

## 2015-02-11 LAB — TSH: TSH: 1.055 u[IU]/mL (ref 0.350–4.500)

## 2015-03-02 ENCOUNTER — Encounter: Payer: Self-pay | Admitting: Family Medicine

## 2015-03-13 ENCOUNTER — Ambulatory Visit: Payer: 59 | Admitting: Gynecology

## 2015-04-06 ENCOUNTER — Ambulatory Visit: Payer: 59 | Admitting: Obstetrics and Gynecology

## 2015-04-07 ENCOUNTER — Other Ambulatory Visit: Payer: Self-pay | Admitting: Family Medicine

## 2015-04-11 ENCOUNTER — Encounter: Payer: Self-pay | Admitting: Family Medicine

## 2015-04-12 ENCOUNTER — Other Ambulatory Visit: Payer: Self-pay | Admitting: Family Medicine

## 2015-04-13 ENCOUNTER — Encounter: Payer: Self-pay | Admitting: Family Medicine

## 2015-04-13 ENCOUNTER — Telehealth: Payer: Self-pay | Admitting: Family Medicine

## 2015-04-13 NOTE — Telephone Encounter (Signed)
Pt states pharmacy did not receive the RX for levothyroxine (as was indicated in pt email as being sent in today). Please resend.

## 2015-04-14 ENCOUNTER — Other Ambulatory Visit: Payer: Self-pay | Admitting: *Deleted

## 2015-04-14 MED ORDER — LEVOTHYROXINE SODIUM 125 MCG PO TABS: 125.0000 ug | ORAL_TABLET | Freq: Every day | ORAL | Status: DC

## 2015-04-14 NOTE — Telephone Encounter (Signed)
Rx resent.

## 2015-04-25 ENCOUNTER — Ambulatory Visit (INDEPENDENT_AMBULATORY_CARE_PROVIDER_SITE_OTHER): Payer: 59 | Admitting: Licensed Clinical Social Worker

## 2015-04-25 DIAGNOSIS — F321 Major depressive disorder, single episode, moderate: Secondary | ICD-10-CM | POA: Diagnosis not present

## 2015-05-23 ENCOUNTER — Ambulatory Visit (INDEPENDENT_AMBULATORY_CARE_PROVIDER_SITE_OTHER): Payer: 59 | Admitting: Licensed Clinical Social Worker

## 2015-05-23 DIAGNOSIS — F321 Major depressive disorder, single episode, moderate: Secondary | ICD-10-CM | POA: Diagnosis not present

## 2015-06-08 ENCOUNTER — Encounter: Payer: Self-pay | Admitting: Family Medicine

## 2015-06-08 ENCOUNTER — Other Ambulatory Visit: Payer: Self-pay | Admitting: Obstetrics and Gynecology

## 2015-06-08 MED ORDER — ALPRAZOLAM 0.5 MG PO TABS: 0.5000 mg | ORAL_TABLET | Freq: Two times a day (BID) | ORAL | Status: DC | PRN

## 2015-06-08 NOTE — Telephone Encounter (Signed)
Medication filled to pharmacy as requested.   

## 2015-06-08 NOTE — Telephone Encounter (Signed)
Last ov 02/10/15 Alprazolam last filled 01-12-15 #30 with 0

## 2015-06-12 LAB — CYTOLOGY - PAP

## 2015-06-13 ENCOUNTER — Other Ambulatory Visit: Payer: Self-pay | Admitting: Obstetrics and Gynecology

## 2015-06-13 DIAGNOSIS — R928 Other abnormal and inconclusive findings on diagnostic imaging of breast: Secondary | ICD-10-CM

## 2015-06-19 ENCOUNTER — Ambulatory Visit
Admission: RE | Admit: 2015-06-19 | Discharge: 2015-06-19 | Disposition: A | Discharge status: Home / Self Care | Payer: 59 | Source: Ambulatory Visit | Attending: Obstetrics and Gynecology | Admitting: Obstetrics and Gynecology

## 2015-06-19 ENCOUNTER — Other Ambulatory Visit: Payer: Self-pay

## 2015-06-19 ENCOUNTER — Other Ambulatory Visit: Payer: Self-pay | Admitting: Obstetrics and Gynecology

## 2015-06-19 DIAGNOSIS — R928 Other abnormal and inconclusive findings on diagnostic imaging of breast: Secondary | ICD-10-CM

## 2015-06-30 ENCOUNTER — Ambulatory Visit
Admission: RE | Admit: 2015-06-30 | Discharge: 2015-06-30 | Disposition: A | Discharge status: Home / Self Care | Payer: 59 | Source: Ambulatory Visit | Attending: Obstetrics and Gynecology | Admitting: Obstetrics and Gynecology

## 2015-06-30 ENCOUNTER — Other Ambulatory Visit: Payer: Self-pay | Admitting: Obstetrics and Gynecology

## 2015-06-30 DIAGNOSIS — R928 Other abnormal and inconclusive findings on diagnostic imaging of breast: Secondary | ICD-10-CM

## 2015-06-30 LAB — HM MAMMOGRAPHY

## 2015-07-06 ENCOUNTER — Encounter: Payer: Self-pay | Admitting: Family Medicine

## 2015-07-18 ENCOUNTER — Ambulatory Visit: Payer: Self-pay | Admitting: Surgery

## 2015-07-18 DIAGNOSIS — N6320 Unspecified lump in the left breast, unspecified quadrant: Secondary | ICD-10-CM

## 2015-07-18 NOTE — H&P (Signed)
Alexis Wood 07/18/2015 10:25 AM Location: [facility] Patient #: [id number] DOB: [birth year] Married / Language: [name] / Race: White Female  History of Present Illness Alexis Fus A. Ester Hilley MD; 07/18/2015 1:29 PM) Patient words: Patient sent at the request of Dr. Kerry Kass for left breast mass. She was found on screening mammogram to have 2 areas of the left breast 1 the left inner quadrant 1. Left outer quadrant. These were both biopsied. The upper-outer quadrant left breast lesion was as complex sclerosing lesion. The inner quadrant lesion was fat necrosis. Patient denies any history of breast pain, nipple discharge or change in the appearance of either breast.        Diagnosis 1. Breast, left, needle core biopsy, UIQ - COMPLEX SCLEROSING LESION WITH CALCIFICATIONS. - SEE COMMENT. 2. Breast, left, needle core biopsy, UOQ - COMPLEX SCLEROSING LESION. 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     CLINICAL DATA: Status post stereotactic guided scratch stat status post 3D stereotactic guided core needle biopsy of an area of architectural distortion in the upper inner quadrant of the left breast and an area of architectural distortion in the upper-outer quadrant of the left breast. EXAM: 3D DIAGNOSTIC LEFT MAMMOGRAM POST STEREOTACTIC BIOPSY COMPARISON: Previous exam(s). FINDINGS: 3D Mammographic images were obtained following stereotactic guided biopsy of areas of architectural distortion in the upper inner quadrant of the left breast and upper outer quadrant of the left breast. These demonstrate an X shaped biopsy marker clip at the expected location of the biopsied distortion in the upper inner quadrant of the left breast. No residual distortion is visible on these images. There is also a coil shaped biopsy marker clip located 1.5 cm inferior to the center of the area of  architectural distortion in the upper outer quadrant of the left breast, which remains following biopsy. IMPRESSION: 1. Appropriate deployment of an X shaped biopsy marker clip at the location of biopsied architectural distortion in the upper inner quadrant of the left breast. 2. The coil shaped biopsy marker clip is located 1.5 cm inferior to the targeted area of architectural distortion in the upper-outer quadrant of the left breast. This area was most likely not adequately sampled. This was discussed with the patient. Final Assessment: Post Procedure Mammograms for Marker Placement Electronically Signed By: Beckie Salts M.D. On: 06/30/2015 13:27 .  The patient is a 59 year old female.   Other Problems [name], CMA; 07/18/2015 10:25 AM) Anxiety Disorder Arthritis High blood pressure Thyroid Disease  Past Surgical History [name], CMA; 07/18/2015 10:25 AM) Breast Biopsy Left. Thyroid Surgery  Diagnostic Studies History [name], [place]; 07/18/2015 10:25 AM) Colonoscopy 5-10 years ago Mammogram within last year Pap Smear 1-5 years ago  Allergies [name], CMA; 07/18/2015 10:25 AM) Penicillin V *PENICILLINS* Pristiq *ANTIDEPRESSANTS* Hives.  Medication History [name], CMA; 07/18/2015 10:26 AM) ALPRAZolam (0.5MG  Tablet, Oral) Active. BuPROPion HCl ER (XL) (  Tablet ER 24HR, Oral) Active. Levothyroxine Sodium ( Tablet, Oral) Active. Losartan Potassium (  Tablet, Oral) Active. Eye Vitamins (Oral) Active. Medications Reconciled  Social History [name], [place]; 07/18/2015 10:25 AM) Caffeine use Carbonated beverages, Coffee, Tea. No alcohol use No drug use Tobacco use Current every day smoker.  Family History [name], [place]; 07/18/2015 10:25 AM) Alcohol Abuse Father. Diabetes Mellitus Father. Heart Disease Father, Mother. Hypertension Father, Mother.  Pregnancy / Birth History [name], CMA; 07/18/2015 10:25  AM) Age at menarche 14 years. Age of menopause 41-50 Gravida  1 Maternal age 38-35 Para 1     Review of Systems [name] CMA; 07/18/2015 10:25 AM) General Present- Night Sweats. Not Present- Appetite Loss, Chills, Fatigue, Fever, Weight Gain and Weight Loss. Skin Not Present- Change in Wart/Mole, Dryness, Hives, Jaundice, New Lesions, Non-Healing Wounds, Rash and Ulcer. HEENT Present- Wears glasses/contact lenses. Not Present- Earache, Hearing Loss, Hoarseness, Nose Bleed, Oral Ulcers, Ringing in the Ears, Seasonal Allergies, Sinus Pain, Sore Throat, Visual Disturbances and Yellow Eyes. Respiratory Not Present- Bloody sputum, Chronic Cough, Difficulty Breathing, Snoring and Wheezing. Breast Not Present- Breast Mass, Breast Pain, Nipple Discharge and Skin Changes. Cardiovascular Not Present- Chest Pain, Difficulty Breathing Lying Down, Leg Cramps, Palpitations, Rapid Heart Rate, Shortness of Breath and Swelling of Extremities. Gastrointestinal Not Present- Abdominal Pain, Bloating, Bloody Stool, Change in Bowel Habits, Chronic diarrhea, Constipation, Difficulty Swallowing, Excessive gas, Gets full quickly at meals, Hemorrhoids, Indigestion, Nausea, Rectal Pain and Vomiting. Female Genitourinary Not Present- Frequency, Nocturia, Painful Urination, Pelvic Pain and Urgency. Musculoskeletal Not Present- Back Pain, Joint Pain, Joint Stiffness, Muscle Pain, Muscle Weakness and Swelling of Extremities. Neurological Not Present- Decreased Memory, Fainting, Headaches, Numbness, Seizures, Tingling, Tremor, Trouble walking and Weakness. Psychiatric Present- Anxiety. Not Present- Bipolar, Change in Sleep Pattern, Depression, Fearful and Frequent crying. Endocrine Present- Hot flashes. Not Present- Cold Intolerance, Excessive Hunger, Hair Changes, Heat Intolerance and New Diabetes. Hematology Not Present- Easy Bruising, Excessive bleeding, Gland problems, HIV and Persistent Infections.  Vitals  [name] CMA; 07/18/2015 10:26 AM) 07/18/2015 10:26 AM Weight: 172 lb Height: 63in Body Surface Area: 1.81 m Body Mass Index: 30.47 kg/m  Temp.: 97.65F(Temporal)  Pulse: 102 (Regular)  Resp.: 18 (Unlabored)  BP: 138/82 (Sitting, Left Arm, Standard)      Physical Exam (Jessie Schrieber A. Gary Bultman MD; 07/18/2015 1:29 PM)  General Mental Status-Alert. General Appearance-Consistent with stated age. Hydration-Well hydrated. Voice-Normal.  Head and Neck Head-normocephalic, atraumatic with no lesions or palpable masses. Trachea-midline. Thyroid Gland Characteristics - normal size and consistency.  Chest and Lung Exam Chest and lung exam reveals -quiet, even and easy respiratory effort with no use of accessory muscles and on auscultation, normal breath sounds, no adventitious sounds and normal vocal resonance. Inspection Chest Wall - Normal. Back - normal.  Breast Breast - Left-Symmetric, Non Tender, No Biopsy scars, no Dimpling, No Inflammation, No Lumpectomy scars, No Mastectomy scars, No Peau d' Orange. Breast - Right-Symmetric, Non Tender, No Biopsy scars, no Dimpling, No Inflammation, No Lumpectomy scars, No Mastectomy scars, No Peau d' Orange. Breast Lump-No Palpable Breast Mass.  Cardiovascular Cardiovascular examination reveals -normal heart sounds, regular rate and rhythm with no murmurs and normal pedal pulses bilaterally.  Lymphatic Axillary  General Axillary Region: Bilateral - Description - Normal. Tenderness - Non Tender.    Assessment & Plan (Keylen Eckenrode A. Marianita Botkin MD; 07/18/2015 1:28 PM)  LEFT BREAST MASS (N63)  LEFT BREAST MASS (N63) Impression: RECOMMEND EXCISON OF BOTH THE INNER QUADRANT AND OUTER QUADRANT LESION DUE TO SMALL RISK OF MALIGNANCY   Risk of lumpectomy include bleeding, infection, seroma, more surgery, use of seed/wire, wound care, cosmetic deformity and the need for other treatments, death , blood clots, death. Pt  agrees to proceed.  Current Plans Pt Education - [facility] Breast Biopsy HCI: discussed with patient and provided information. The anatomy and the physiology was discussed. The pathophysiology and natural history of the disease was discussed. Options were discussed and recommendations were made. Technique, risks, benefits, & alternatives were discussed. Risks such as stroke, heart attack, bleeding, indection, death,  and other risks discussed. Questions answered. The patient agrees to proceed. You are being scheduled for surgery - Our schedulers will call you.  You should hear from our office's scheduling department within 5 working days about the location, date, and time of surgery. We try to make accommodations for patient's preferences in scheduling surgery, but sometimes the OR schedule or the surgeon's schedule prevents us from making those accommodations.  If you have not heard from our office [phone]) in 5 working days, call the office and ask for your surgeon's nurse.  If you have other questions about your diagnosis, plan, or surgery, call the office and ask for your surgeon's nurse.  Pt Education - [facility] Breast Biopsy HCI: discussed with patient and provided information. Pt Education - [facility] Breast Biopsy Instructions (FLB): discussed with patient and provided information.

## 2015-07-27 ENCOUNTER — Telehealth: Payer: Self-pay | Admitting: Behavioral Health

## 2015-07-27 NOTE — Telephone Encounter (Signed)
Unable to reach patient at time of Pre-Visit Call.  Left message for patient to return call when available.    

## 2015-07-28 ENCOUNTER — Other Ambulatory Visit: Payer: Self-pay | Admitting: Family Medicine

## 2015-07-28 ENCOUNTER — Encounter: Payer: Self-pay | Admitting: Family Medicine

## 2015-07-28 ENCOUNTER — Other Ambulatory Visit: Payer: Self-pay | Admitting: Surgery

## 2015-07-28 ENCOUNTER — Ambulatory Visit (INDEPENDENT_AMBULATORY_CARE_PROVIDER_SITE_OTHER): Payer: 59 | Admitting: Family Medicine

## 2015-07-28 ENCOUNTER — Encounter: Payer: Self-pay | Admitting: General Practice

## 2015-07-28 ENCOUNTER — Other Ambulatory Visit: Payer: Self-pay | Admitting: General Practice

## 2015-07-28 VITALS — BP 130/76 | HR 100 | Temp 97.9°F | Resp 16 | Ht 64.0 in | Wt 173.0 lb

## 2015-07-28 DIAGNOSIS — E039 Hypothyroidism, unspecified: Secondary | ICD-10-CM

## 2015-07-28 DIAGNOSIS — N6320 Unspecified lump in the left breast, unspecified quadrant: Secondary | ICD-10-CM

## 2015-07-28 DIAGNOSIS — Z0000 Encounter for general adult medical examination without abnormal findings: Secondary | ICD-10-CM

## 2015-07-28 LAB — CBC WITH DIFFERENTIAL/PLATELET
BASOS PCT: 0.5 % (ref 0.0–3.0)
Basophils Absolute: 0.1 10*3/uL (ref 0.0–0.1)
EOS PCT: 3.1 % (ref 0.0–5.0)
Eosinophils Absolute: 0.3 10*3/uL (ref 0.0–0.7)
HEMATOCRIT: 48.1 % — AB (ref 36.0–46.0)
Hemoglobin: 16.1 g/dL — ABNORMAL HIGH (ref 12.0–15.0)
LYMPHS ABS: 3.8 10*3/uL (ref 0.7–4.0)
LYMPHS PCT: 37.5 % (ref 12.0–46.0)
MCHC: 33.5 g/dL (ref 30.0–36.0)
MCV: 86.7 fl (ref 78.0–100.0)
MONOS PCT: 5.3 % (ref 3.0–12.0)
Monocytes Absolute: 0.5 10*3/uL (ref 0.1–1.0)
NEUTROS ABS: 5.5 10*3/uL (ref 1.4–7.7)
NEUTROS PCT: 53.6 % (ref 43.0–77.0)
PLATELETS: 179 10*3/uL (ref 150.0–400.0)
RBC: 5.55 Mil/uL — ABNORMAL HIGH (ref 3.87–5.11)
RDW: 12.5 % (ref 11.5–15.5)
WBC: 10.2 10*3/uL (ref 4.0–10.5)

## 2015-07-28 LAB — BASIC METABOLIC PANEL
BUN: 15 mg/dL (ref 6–23)
CALCIUM: 9.6 mg/dL (ref 8.4–10.5)
CO2: 30 mEq/L (ref 19–32)
Chloride: 103 mEq/L (ref 96–112)
Creatinine, Ser: 0.84 mg/dL (ref 0.40–1.20)
GFR: 73.73 mL/min (ref >60.00)
GLUCOSE: 83 mg/dL (ref 70–99)
POTASSIUM: 3.9 meq/L (ref 3.5–5.1)
SODIUM: 141 meq/L (ref 135–145)

## 2015-07-28 LAB — VITAMIN D 25 HYDROXY (VIT D DEFICIENCY, FRACTURES): VITD: 23.01 ng/mL — ABNORMAL LOW (ref 30.00–100.00)

## 2015-07-28 LAB — LIPID PANEL
CHOLESTEROL: 164 mg/dL (ref 0–200)
HDL: 64.8 mg/dL (ref >39.00)
LDL CALC: 78 mg/dL (ref 0–99)
NONHDL: 99.47
Total CHOL/HDL Ratio: 3
Triglycerides: 107 mg/dL (ref 0.0–149.0)
VLDL: 21.4 mg/dL (ref 0.0–40.0)

## 2015-07-28 LAB — HEPATIC FUNCTION PANEL
ALK PHOS: 77 U/L (ref 39–117)
ALT: 35 U/L (ref 0–35)
AST: 23 U/L (ref 0–37)
Albumin: 4.4 g/dL (ref 3.5–5.2)
BILIRUBIN DIRECT: 0.1 mg/dL (ref 0.0–0.3)
BILIRUBIN TOTAL: 0.5 mg/dL (ref 0.2–1.2)
TOTAL PROTEIN: 7.3 g/dL (ref 6.0–8.3)

## 2015-07-28 LAB — TSH: TSH: 0.16 u[IU]/mL — AB (ref 0.35–4.50)

## 2015-07-28 MED ORDER — VITAMIN D (ERGOCALCIFEROL) 1.25 MG (50000 UNIT) PO CAPS: 50000.0000 [IU] | ORAL_CAPSULE | ORAL | Status: DC

## 2015-07-28 MED ORDER — LEVOTHYROXINE SODIUM 112 MCG PO TABS: 112.0000 ug | ORAL_TABLET | Freq: Every day | ORAL | Status: DC

## 2015-07-28 NOTE — Progress Notes (Signed)
   Subjective:    Patient ID: Alexis ConradiKaren F Gross, female    DOB: Dec 03, 1955, 59 y.o.   MRN: [id number]  HPI CPE- UTD on GYN, colonoscopy, mammo   Review of Systems Patient reports no vision/ hearing changes, adenopathy,fever, weight change,  persistant/recurrent hoarseness , swallowing issues, chest pain, palpitations, edema, persistant/recurrent cough, hemoptysis, dyspnea (rest/exertional/paroxysmal nocturnal), gastrointestinal bleeding (melena, rectal bleeding), abdominal pain, significant heartburn, bowel changes, GU symptoms (dysuria, hematuria, incontinence), Gyn symptoms (abnormal  bleeding, pain),  syncope, focal weakness, memory loss, numbness & tingling, skin/hair/nail changes, abnormal bruising or bleeding, anxiety, or depression.     Objective:   Physical Exam General Appearance:    Alert, cooperative, no distress, appears stated age  Head:    Normocephalic, without obvious abnormality, atraumatic  Eyes:    PERRL, conjunctiva/corneas clear, EOM's intact, fundi    benign, both eyes  Ears:    Normal TM's and external ear canals, both ears  Nose:   Nares normal, septum midline, mucosa normal, no drainage    or sinus tenderness  Throat:   Lips, mucosa, and tongue normal; teeth and gums normal  Neck:   Supple, symmetrical, trachea midline, no adenopathy;    Thyroid: no enlargement/tenderness/nodules  Back:     Symmetric, no curvature, ROM normal, no CVA tenderness  Lungs:     Clear to auscultation bilaterally, respirations unlabored  Chest Wall:    No tenderness or deformity   Heart:    Regular rate and rhythm, S1 and S2 normal, no murmur, rub   or gallop  Breast Exam:    Deferred to GYN  Abdomen:     Soft, non-tender, bowel sounds active all four quadrants,    no masses, no organomegaly  Genitalia:    Deferred to GYN  Rectal:    Extremities:   Extremities normal, atraumatic, no cyanosis or edema  Pulses:   2+ and symmetric all extremities  Skin:   Skin color, texture, turgor  normal, no rashes or lesions  Lymph nodes:   Cervical, supraclavicular, and axillary nodes normal  Neurologic:   CNII-XII intact, normal strength, sensation and reflexes    throughout          Assessment & Plan:

## 2015-07-28 NOTE — Assessment & Plan Note (Signed)
Pt's PE WNL w/ exception of being overweight.  UTD on pap, mammo, colonoscopy.  UTD on flu shot.  Encouraged healthy diet, regular exercise, smoking cessation.  Check labs.  Anticipatory guidance provided.

## 2015-07-28 NOTE — Patient Instructions (Signed)
Follow up in 6 months to recheck BP We'll notify you of your lab results and make any changes if needed Try and work on healthy diet and regular exercise- do it for you, not for weight loss! Call with any questions or concerns If you want to join us at the new [place] office, any scheduled appointments will automatically transfer and we will see you at [address], [place], [place] [id number]  Happy Holidays!!!

## 2015-07-31 ENCOUNTER — Encounter: Payer: Self-pay | Admitting: Family Medicine

## 2015-08-05 ENCOUNTER — Other Ambulatory Visit: Payer: Self-pay | Admitting: Family Medicine

## 2015-08-07 ENCOUNTER — Encounter: Payer: Self-pay | Admitting: Family Medicine

## 2015-08-07 NOTE — Telephone Encounter (Signed)
Medication filled to pharmacy as requested.   

## 2015-08-18 ENCOUNTER — Encounter: Payer: Self-pay | Admitting: Family Medicine

## 2015-08-24 ENCOUNTER — Other Ambulatory Visit: Payer: Self-pay | Admitting: Family Medicine

## 2015-08-24 NOTE — Telephone Encounter (Signed)
Medication filled to pharmacy as requested.   

## 2015-08-24 NOTE — Telephone Encounter (Signed)
Last OV 07/28/15 Alprazolam last filled 06/08/15 #30 with 0

## 2015-08-28 ENCOUNTER — Encounter (HOSPITAL_BASED_OUTPATIENT_CLINIC_OR_DEPARTMENT_OTHER): Payer: Self-pay | Admitting: *Deleted

## 2015-08-28 ENCOUNTER — Encounter: Payer: Self-pay | Admitting: Family Medicine

## 2015-08-28 DIAGNOSIS — Z01818 Encounter for other preprocedural examination: Secondary | ICD-10-CM

## 2015-08-28 NOTE — Telephone Encounter (Signed)
Orders placed.

## 2015-08-29 ENCOUNTER — Other Ambulatory Visit (INDEPENDENT_AMBULATORY_CARE_PROVIDER_SITE_OTHER): Payer: 59

## 2015-08-29 ENCOUNTER — Ambulatory Visit (INDEPENDENT_AMBULATORY_CARE_PROVIDER_SITE_OTHER): Payer: 59 | Admitting: Family Medicine

## 2015-08-29 DIAGNOSIS — Z01818 Encounter for other preprocedural examination: Secondary | ICD-10-CM

## 2015-08-29 DIAGNOSIS — E039 Hypothyroidism, unspecified: Secondary | ICD-10-CM

## 2015-08-29 NOTE — Progress Notes (Signed)
Pre visit review using our clinic review tool, if applicable. No additional management support is needed unless otherwise documented below in the visit note.  Patient in for EKG and labs for surgical clearance

## 2015-08-30 ENCOUNTER — Encounter: Payer: Self-pay | Admitting: Family Medicine

## 2015-08-30 ENCOUNTER — Encounter (HOSPITAL_BASED_OUTPATIENT_CLINIC_OR_DEPARTMENT_OTHER): Payer: Self-pay | Admitting: *Deleted

## 2015-08-30 LAB — TSH: TSH: 0.39 u[IU]/mL (ref 0.35–4.50)

## 2015-08-30 LAB — CBC WITH DIFFERENTIAL/PLATELET
BASOS ABS: 0 10*3/uL (ref 0.0–0.1)
Basophils Relative: 0.4 % (ref 0.0–3.0)
EOS ABS: 0.5 10*3/uL (ref 0.0–0.7)
Eosinophils Relative: 4.5 % (ref 0.0–5.0)
HCT: 46.9 % — ABNORMAL HIGH (ref 36.0–46.0)
Hemoglobin: 15.7 g/dL — ABNORMAL HIGH (ref 12.0–15.0)
LYMPHS ABS: 3.4 10*3/uL (ref 0.7–4.0)
Lymphocytes Relative: 33.1 % (ref 12.0–46.0)
MCHC: 33.5 g/dL (ref 30.0–36.0)
MCV: 87.8 fl (ref 78.0–100.0)
MONO ABS: 0.6 10*3/uL (ref 0.1–1.0)
MONOS PCT: 5.5 % (ref 3.0–12.0)
NEUTROS PCT: 56.5 % (ref 43.0–77.0)
Neutro Abs: 5.9 10*3/uL (ref 1.4–7.7)
PLATELETS: 205 10*3/uL (ref 150.0–400.0)
RBC: 5.34 Mil/uL — AB (ref 3.87–5.11)
RDW: 12.6 % (ref 11.5–15.5)
WBC: 10.4 10*3/uL (ref 4.0–10.5)

## 2015-09-05 ENCOUNTER — Ambulatory Visit (HOSPITAL_BASED_OUTPATIENT_CLINIC_OR_DEPARTMENT_OTHER)
Admission: RE | Admit: 2015-09-05 | Discharge: 2015-09-05 | Disposition: A | Discharge status: Home / Self Care | Payer: 59 | Source: Ambulatory Visit | Attending: Surgery | Admitting: Surgery

## 2015-09-05 ENCOUNTER — Encounter (HOSPITAL_BASED_OUTPATIENT_CLINIC_OR_DEPARTMENT_OTHER): Payer: Self-pay | Admitting: Anesthesiology

## 2015-09-05 ENCOUNTER — Ambulatory Visit
Admission: RE | Admit: 2015-09-05 | Discharge: 2015-09-05 | Disposition: A | Discharge status: Home / Self Care | Payer: 59 | Source: Ambulatory Visit | Attending: Surgery | Admitting: Surgery

## 2015-09-05 ENCOUNTER — Other Ambulatory Visit: Payer: Self-pay | Admitting: Surgery

## 2015-09-05 ENCOUNTER — Ambulatory Visit (HOSPITAL_BASED_OUTPATIENT_CLINIC_OR_DEPARTMENT_OTHER): Payer: 59 | Admitting: Anesthesiology

## 2015-09-05 ENCOUNTER — Encounter (HOSPITAL_BASED_OUTPATIENT_CLINIC_OR_DEPARTMENT_OTHER)
Admission: RE | Disposition: A | Discharge status: Home / Self Care | Payer: Self-pay | Source: Ambulatory Visit | Attending: Surgery

## 2015-09-05 DIAGNOSIS — I10 Essential (primary) hypertension: Secondary | ICD-10-CM | POA: Diagnosis not present

## 2015-09-05 DIAGNOSIS — Z79899 Other long term (current) drug therapy: Secondary | ICD-10-CM | POA: Insufficient documentation

## 2015-09-05 DIAGNOSIS — N6320 Unspecified lump in the left breast, unspecified quadrant: Secondary | ICD-10-CM

## 2015-09-05 DIAGNOSIS — E039 Hypothyroidism, unspecified: Secondary | ICD-10-CM | POA: Diagnosis not present

## 2015-09-05 DIAGNOSIS — F419 Anxiety disorder, unspecified: Secondary | ICD-10-CM | POA: Diagnosis not present

## 2015-09-05 DIAGNOSIS — F17200 Nicotine dependence, unspecified, uncomplicated: Secondary | ICD-10-CM | POA: Diagnosis not present

## 2015-09-05 DIAGNOSIS — Z880 Allergy status to penicillin: Secondary | ICD-10-CM | POA: Diagnosis not present

## 2015-09-05 DIAGNOSIS — Z8249 Family history of ischemic heart disease and other diseases of the circulatory system: Secondary | ICD-10-CM | POA: Diagnosis not present

## 2015-09-05 DIAGNOSIS — N6022 Fibroadenosis of left breast: Secondary | ICD-10-CM | POA: Insufficient documentation

## 2015-09-05 DIAGNOSIS — M1990 Unspecified osteoarthritis, unspecified site: Secondary | ICD-10-CM | POA: Diagnosis not present

## 2015-09-05 DIAGNOSIS — N6092 Unspecified benign mammary dysplasia of left breast: Secondary | ICD-10-CM | POA: Insufficient documentation

## 2015-09-05 DIAGNOSIS — N649 Disorder of breast, unspecified: Secondary | ICD-10-CM | POA: Diagnosis present

## 2015-09-05 HISTORY — PX: BREAST EXCISIONAL BIOPSY: SUR124

## 2015-09-05 HISTORY — PX: BREAST LUMPECTOMY WITH NEEDLE LOCALIZATION: SHX5759

## 2015-09-05 SURGERY — BREAST LUMPECTOMY WITH NEEDLE LOCALIZATION
Anesthesia: General | Site: Breast | Laterality: Left

## 2015-09-05 MED ORDER — ONDANSETRON HCL 4 MG/2ML IJ SOLN
INTRAMUSCULAR | Status: AC
  Filled 2015-09-05: qty 2

## 2015-09-05 MED ORDER — MEPERIDINE HCL 25 MG/ML IJ SOLN: 6.2500 mg | INTRAMUSCULAR | Status: DC | PRN

## 2015-09-05 MED ORDER — HYDROMORPHONE HCL 1 MG/ML IJ SOLN
0.2500 mg | INTRAMUSCULAR | Status: DC | PRN
  Administered 2015-09-05: 0.25 mg via INTRAVENOUS

## 2015-09-05 MED ORDER — MIDAZOLAM HCL 2 MG/2ML IJ SOLN: 1.0000 mg | INTRAMUSCULAR | Status: DC | PRN

## 2015-09-05 MED ORDER — CLINDAMYCIN PHOSPHATE 300 MG/50ML IV SOLN
300.0000 mg | Freq: Once | INTRAVENOUS | Status: AC
  Administered 2015-09-05 (×2): 300 mg via INTRAVENOUS

## 2015-09-05 MED ORDER — ESMOLOL HCL 100 MG/10ML IV SOLN
INTRAVENOUS | Status: DC | PRN
  Administered 2015-09-05: 10 mg via INTRAVENOUS

## 2015-09-05 MED ORDER — LACTATED RINGERS IV SOLN
INTRAVENOUS | Status: DC
  Administered 2015-09-05 (×2): via INTRAVENOUS

## 2015-09-05 MED ORDER — OXYCODONE HCL 5 MG/5ML PO SOLN
5.0000 mg | Freq: Once | ORAL | Status: AC | PRN
Start: 2015-09-05 — End: 2015-09-05

## 2015-09-05 MED ORDER — BUPIVACAINE-EPINEPHRINE 0.25% -1:200000 IJ SOLN
INTRAMUSCULAR | Status: DC | PRN
  Administered 2015-09-05: 20 mL

## 2015-09-05 MED ORDER — PROPOFOL 10 MG/ML IV BOLUS
INTRAVENOUS | Status: AC
  Filled 2015-09-05: qty 40

## 2015-09-05 MED ORDER — OXYCODONE HCL 5 MG PO TABS
ORAL_TABLET | ORAL | Status: AC
  Filled 2015-09-05: qty 1

## 2015-09-05 MED ORDER — FENTANYL CITRATE (PF) 100 MCG/2ML IJ SOLN: 50.0000 ug | INTRAMUSCULAR | Status: DC | PRN

## 2015-09-05 MED ORDER — PROPOFOL 10 MG/ML IV BOLUS
INTRAVENOUS | Status: DC | PRN
  Administered 2015-09-05: 200 mg via INTRAVENOUS
  Administered 2015-09-05: 50 mg via INTRAVENOUS

## 2015-09-05 MED ORDER — FENTANYL CITRATE (PF) 100 MCG/2ML IJ SOLN
INTRAMUSCULAR | Status: DC | PRN
  Administered 2015-09-05: 100 ug via INTRAVENOUS

## 2015-09-05 MED ORDER — OXYCODONE HCL 5 MG PO TABS
5.0000 mg | ORAL_TABLET | Freq: Once | ORAL | Status: AC | PRN
  Administered 2015-09-05: 5 mg via ORAL

## 2015-09-05 MED ORDER — DEXAMETHASONE SODIUM PHOSPHATE 4 MG/ML IJ SOLN
INTRAMUSCULAR | Status: DC | PRN
  Administered 2015-09-05: 10 mg via INTRAVENOUS

## 2015-09-05 MED ORDER — CHLORHEXIDINE GLUCONATE 4 % EX LIQD: 1.0000 "application " | Freq: Once | CUTANEOUS | Status: DC

## 2015-09-05 MED ORDER — ONDANSETRON HCL 4 MG/2ML IJ SOLN
INTRAMUSCULAR | Status: DC | PRN
  Administered 2015-09-05: 4 mg via INTRAVENOUS

## 2015-09-05 MED ORDER — OXYCODONE-ACETAMINOPHEN 5-325 MG PO TABS: 1.0000 | ORAL_TABLET | ORAL | Status: DC | PRN

## 2015-09-05 MED ORDER — ESMOLOL HCL 100 MG/10ML IV SOLN
INTRAVENOUS | Status: AC
  Filled 2015-09-05: qty 10

## 2015-09-05 MED ORDER — HYDROMORPHONE HCL 1 MG/ML IJ SOLN
INTRAMUSCULAR | Status: AC
  Filled 2015-09-05: qty 1

## 2015-09-05 MED ORDER — MIDAZOLAM HCL 2 MG/2ML IJ SOLN
INTRAMUSCULAR | Status: AC
  Filled 2015-09-05: qty 2

## 2015-09-05 MED ORDER — LIDOCAINE HCL (CARDIAC) 20 MG/ML IV SOLN
INTRAVENOUS | Status: AC
  Filled 2015-09-05: qty 5

## 2015-09-05 MED ORDER — PHENYLEPHRINE HCL 10 MG/ML IJ SOLN
INTRAMUSCULAR | Status: AC
  Filled 2015-09-05: qty 1

## 2015-09-05 MED ORDER — SCOPOLAMINE 1 MG/3DAYS TD PT72: 1.0000 | MEDICATED_PATCH | Freq: Once | TRANSDERMAL | Status: DC

## 2015-09-05 MED ORDER — PHENYLEPHRINE HCL 10 MG/ML IJ SOLN
INTRAMUSCULAR | Status: DC | PRN
  Administered 2015-09-05: 40 ug via INTRAVENOUS
  Administered 2015-09-05: 80 ug via INTRAVENOUS

## 2015-09-05 MED ORDER — LIDOCAINE HCL (CARDIAC) 20 MG/ML IV SOLN
INTRAVENOUS | Status: DC | PRN
  Administered 2015-09-05: 50 mg via INTRAVENOUS

## 2015-09-05 MED ORDER — GLYCOPYRROLATE 0.2 MG/ML IJ SOLN: 0.2000 mg | Freq: Once | INTRAMUSCULAR | Status: DC | PRN

## 2015-09-05 MED ORDER — BUPIVACAINE-EPINEPHRINE (PF) 0.25% -1:200000 IJ SOLN
INTRAMUSCULAR | Status: AC
  Filled 2015-09-05: qty 30

## 2015-09-05 MED ORDER — FENTANYL CITRATE (PF) 100 MCG/2ML IJ SOLN
INTRAMUSCULAR | Status: AC
  Filled 2015-09-05: qty 2

## 2015-09-05 MED ORDER — MIDAZOLAM HCL 5 MG/5ML IJ SOLN
INTRAMUSCULAR | Status: DC | PRN
  Administered 2015-09-05: 2 mg via INTRAVENOUS

## 2015-09-05 MED ORDER — DEXAMETHASONE SODIUM PHOSPHATE 10 MG/ML IJ SOLN
INTRAMUSCULAR | Status: AC
  Filled 2015-09-05: qty 1

## 2015-09-05 MED ORDER — CLINDAMYCIN PHOSPHATE 300 MG/50ML IV SOLN
INTRAVENOUS | Status: AC
  Filled 2015-09-05: qty 50

## 2015-09-05 SURGICAL SUPPLY — 55 items
APPLIER CLIP 11 MED OPEN (CLIP)
APPLIER CLIP 9.375 MED OPEN (MISCELLANEOUS)
APR CLP MED 11 20 MLT OPN (CLIP)
APR CLP MED 9.3 20 MLT OPN (MISCELLANEOUS)
BINDER BREAST LRG (GAUZE/BANDAGES/DRESSINGS) IMPLANT
BINDER BREAST MEDIUM (GAUZE/BANDAGES/DRESSINGS) IMPLANT
BINDER BREAST XLRG (GAUZE/BANDAGES/DRESSINGS) ×2 IMPLANT
BINDER BREAST XXLRG (GAUZE/BANDAGES/DRESSINGS) IMPLANT
BIOPATCH RED 1 DISK 7.0 (GAUZE/BANDAGES/DRESSINGS) IMPLANT
BLADE SURG 15 STRL LF DISP TIS (BLADE) ×1 IMPLANT
BLADE SURG 15 STRL SS (BLADE) ×2
CANISTER SUCT 1200ML W/VALVE (MISCELLANEOUS) ×2 IMPLANT
CHLORAPREP W/TINT 26ML (MISCELLANEOUS) ×2 IMPLANT
CLIP APPLIE 11 MED OPEN (CLIP) IMPLANT
CLIP APPLIE 9.375 MED OPEN (MISCELLANEOUS) IMPLANT
COVER BACK TABLE 60X90IN (DRAPES) ×2 IMPLANT
COVER MAYO STAND STRL (DRAPES) ×2 IMPLANT
DECANTER SPIKE VIAL GLASS SM (MISCELLANEOUS) IMPLANT
DEVICE DUBIN W/COMP PLATE 8390 (MISCELLANEOUS) ×4 IMPLANT
DRAIN CHANNEL 19F RND (DRAIN) IMPLANT
DRAPE LAPAROSCOPIC ABDOMINAL (DRAPES) IMPLANT
DRAPE LAPAROTOMY 100X72 PEDS (DRAPES) ×2 IMPLANT
DRAPE UTILITY XL STRL (DRAPES) ×2 IMPLANT
ELECT COATED BLADE 2.86 ST (ELECTRODE) ×2 IMPLANT
ELECT REM PT RETURN 9FT ADLT (ELECTROSURGICAL) ×2
ELECTRODE REM PT RTRN 9FT ADLT (ELECTROSURGICAL) ×1 IMPLANT
EVACUATOR SILICONE 100CC (DRAIN) IMPLANT
GLOVE BIOGEL M 7.0 STRL (GLOVE) ×1 IMPLANT
GLOVE BIOGEL PI IND STRL 7.0 (GLOVE) IMPLANT
GLOVE BIOGEL PI IND STRL 7.5 (GLOVE) ×1 IMPLANT
GLOVE BIOGEL PI IND STRL 8 (GLOVE) ×1 IMPLANT
GLOVE BIOGEL PI INDICATOR 7.0 (GLOVE) ×1
GLOVE BIOGEL PI INDICATOR 7.5 (GLOVE) ×1
GLOVE BIOGEL PI INDICATOR 8 (GLOVE) ×1
GLOVE ECLIPSE 8.0 STRL XLNG CF (GLOVE) ×2 IMPLANT
GLOVE EXAM NITRILE EXT CUFF MD (GLOVE) ×2 IMPLANT
GLOVE SURG SS PI 6.5 STRL IVOR (GLOVE) ×2 IMPLANT
GOWN STRL REUS W/ TWL LRG LVL3 (GOWN DISPOSABLE) ×2 IMPLANT
GOWN STRL REUS W/TWL LRG LVL3 (GOWN DISPOSABLE) ×4
KIT MARKER MARGIN INK (KITS) IMPLANT
LIQUID BAND (GAUZE/BANDAGES/DRESSINGS) ×2 IMPLANT
NEEDLE HYPO 25X1 1.5 SAFETY (NEEDLE) ×2 IMPLANT
NS IRRIG 1000ML POUR BTL (IV SOLUTION) ×2 IMPLANT
PACK BASIN DAY SURGERY FS (CUSTOM PROCEDURE TRAY) ×2 IMPLANT
PENCIL BUTTON HOLSTER BLD 10FT (ELECTRODE) ×2 IMPLANT
SLEEVE SCD COMPRESS KNEE MED (MISCELLANEOUS) ×2 IMPLANT
SPONGE LAP 4X18 X RAY DECT (DISPOSABLE) IMPLANT
SUT MON AB 4-0 PC3 18 (SUTURE) ×4 IMPLANT
SUT SILK 2 0 SH (SUTURE) IMPLANT
SUT VICRYL 3-0 CR8 SH (SUTURE) ×2 IMPLANT
SYR CONTROL 10ML LL (SYRINGE) ×2 IMPLANT
TOWEL OR 17X24 6PK STRL BLUE (TOWEL DISPOSABLE) ×4 IMPLANT
TOWEL OR NON WOVEN STRL DISP B (DISPOSABLE) IMPLANT
TUBE CONNECTING 20X1/4 (TUBING) ×2 IMPLANT
YANKAUER SUCT BULB TIP NO VENT (SUCTIONS) ×2 IMPLANT

## 2015-09-05 NOTE — Transfer of Care (Signed)
Immediate Anesthesia Transfer of Care Note  Patient: Alexis Wood  Procedure(s) Performed: Procedure(s): LEFT BREAST WIRE LOCALIZED  LUMPECTOMY TIMES 2 (Left)  Patient Location: PACU  Anesthesia Type:General  Level of Consciousness: awake, alert  and oriented  Airway & Oxygen Therapy: Patient Spontanous Breathing and Patient connected to face mask oxygen  Post-op Assessment: Report given to RN, Post -op Vital signs reviewed and stable and Patient moving all extremities  Post vital signs: Reviewed and stable  Last Vitals:  Filed Vitals:   09/05/15 0924  BP: 169/92  Pulse: 95  Temp: 36.5 C  Resp: 18    Complications: No apparent anesthesia complications

## 2015-09-05 NOTE — Discharge Instructions (Signed)
Breast Biopsy, Care After These instructions give you information on caring for yourself after your procedure. Your doctor may also give you more specific instructions. Call your doctor if you have any problems or questions after your procedure. HOME CARE  Only take medicine as told by your doctor.  Do not take aspirin.  Keep your sutures (stitches) dry when bathing.  Protect the biopsy area. Do not let the area get bumped.  Avoid activities that could pull the biopsy site open until your doctor approves. This includes:  Stretching.  Reaching.  Exercise.  Sports.  Lifting more than 3lb.  Continue your normal diet.  Wear a good support bra for as long as told by your doctor.  Change any bandages (dressings) as told by your doctor.  Do not drink alcohol while taking pain medicine.  Keep all doctor visits as told. Ask when your test results will be ready. Make sure you get your test results. GET HELP RIGHT AWAY IF:   You have a fever.  You have more bleeding (more than a small spot) from the biopsy site.  You have trouble breathing.  You have yellowish-white fluid (pus) coming from the biopsy site.  You have redness, puffiness (swelling), or more pain in the biopsy site.  You have a bad smell coming from the biopsy site.  Your biopsy site opens after sutures, staples, or sticky strips have been removed.  You have a rash.  You need stronger medicine. MAKE SURE YOU:  Understand these instructions.  Will watch your condition.  Will get help right away if you are not doing well or get worse.   This information is not intended to replace advice given to you by your health care provider. Make sure you discuss any questions you have with your health care provider.   Document Released: 06/29/2009 Document Revised: 09/23/2014 Document Reviewed: 10/13/2011 Elsevier Interactive Patient Education 2016 [employer]      [facility] Office Phone  Number [phone]  BREAST BIOPSY/ PARTIAL MASTECTOMY: POST OP INSTRUCTIONS  Always review your discharge instruction sheet given to you by the facility where your surgery was performed.  IF YOU HAVE DISABILITY OR FAMILY LEAVE FORMS, YOU MUST BRING THEM TO THE OFFICE FOR PROCESSING.  DO NOT GIVE THEM TO YOUR DOCTOR.  1. A prescription for pain medication may be given to you upon discharge.  Take your pain medication as prescribed, if needed.  If narcotic pain medicine is not needed, then you may take acetaminophen (Tylenol) or ibuprofen (Advil) as needed. 2. Take your usually prescribed medications unless otherwise directed 3. If you need a refill on your pain medication, please contact your pharmacy.  They will contact our office to request authorization.  Prescriptions will not be filled after 5pm or on week-ends. 4. You should eat very light the first 24 hours after surgery, such as soup, crackers, pudding, etc.  Resume your normal diet the day after surgery. 5. Most patients will experience some swelling and bruising in the breast.  Ice packs and a good support bra will help.  Swelling and bruising can take several days to resolve.  6. It is common to experience some constipation if taking pain medication after surgery.  Increasing fluid intake and taking a stool softener will usually help or prevent this problem from occurring.  A mild laxative (Milk of Magnesia or Miralax) should be taken according to package directions if there are no bowel movements after 48 hours. 7. Unless discharge instructions indicate  otherwise, you may remove your bandages 24-48 hours after surgery, and you may shower at that time.  You may have steri-strips (small skin tapes) in place directly over the incision.  These strips should be left on the skin for 7-10 days.  If your surgeon used skin glue on the incision, you may shower in 24 hours.  The glue will flake off over the next 2-3 weeks.  Any sutures or staples will  be removed at the office during your follow-up visit. 8. ACTIVITIES:  You may resume regular daily activities (gradually increasing) beginning the next day.  Wearing a good support bra or sports bra minimizes pain and swelling.  You may have sexual intercourse when it is comfortable. a. You may drive when you no longer are taking prescription pain medication, you can comfortably wear a seatbelt, and you can safely maneuver your car and apply brakes. b. RETURN TO WORK:  ______________________________________________________________________________________ 9. You should see your doctor in the office for a follow-up appointment approximately two weeks after your surgery.  Your doctors nurse will typically make your follow-up appointment when she calls you with your pathology report.  Expect your pathology report 2-3 business days after your surgery.  You may call to check if you do not hear from [place] after three days. 10. OTHER INSTRUCTIONS: _______________________________________________________________________________________________ _____________________________________________________________________________________________________________________________________ _____________________________________________________________________________________________________________________________________ _____________________________________________________________________________________________________________________________________  WHEN TO CALL YOUR DOCTOR: 1. Fever over 101.0 2. Nausea and/or vomiting. 3. Extreme swelling or bruising. 4. Continued bleeding from incision. 5. Increased pain, redness, or drainage from the incision.  The clinic staff is available to answer your questions during regular business hours.  Please dont hesitate to call and ask to speak to one of the nurses for clinical concerns.  If you have a medical emergency, go to the nearest emergency room or call 911.  A surgeon from  [facility] is always on call at the hospital.  For further questions, please visit [email or web address]     Post Anesthesia Home Care Instructions  Activity: Get plenty of rest for the remainder of the day. A responsible adult should stay with you for 24 hours following the procedure.  For the next 24 hours, DO NOT: -Drive a car -Advertising copywriter -Drink alcoholic beverages -Take any medication unless instructed by your physician -Make any legal decisions or sign important papers.  Meals: Start with liquid foods such as gelatin or soup. Progress to regular foods as tolerated. Avoid greasy, spicy, heavy foods. If nausea and/or vomiting occur, drink only clear liquids until the nausea and/or vomiting subsides. Call your physician if vomiting continues.  Special Instructions/Symptoms: Your throat may feel dry or sore from the anesthesia or the breathing tube placed in your throat during surgery. If this causes discomfort, gargle with warm salt water. The discomfort should disappear within 24 hours.  If you had a scopolamine patch placed behind your ear for the management of post- operative nausea and/or vomiting:  1. The medication in the patch is effective for 72 hours, after which it should be removed.  Wrap patch in a tissue and discard in the trash. Wash hands thoroughly with soap and water. 2. You may remove the patch earlier than 72 hours if you experience unpleasant side effects which may include dry mouth, dizziness or visual disturbances. 3. Avoid touching the patch. Wash your hands with soap and water after contact with the patch.

## 2015-09-05 NOTE — Brief Op Note (Signed)
09/05/2015  11:34 AM  PATIENT:  Alexis Wood  59 y.o. female  PRE-OPERATIVE DIAGNOSIS:  Sclerosing lesion left breast times 2     POST-OPERATIVE DIAGNOSIS:  Sclerosing lesion left breast times 2    PROCEDURE:  Procedure(s): LEFT BREAST WIRE LOCALIZED  LUMPECTOMY TIMES 2 (Left)  SURGEON:  Surgeon(s) and Role:    * Harriette Bouillonhomas Florabelle Cardin, MD - Primary      ANESTHESIA:   local and general  EBL:  Total I/O In: 1000 [I.V.:1000] Out: -   BLOOD ADMINISTERED:none    LOCAL MEDICATIONS USED:  BUPIVICAINE   SPECIMEN:  Source of Specimen:  left breast times 2   DISPOSITION OF SPECIMEN:  PATHOLOGY  COUNTS:  YES  TOURNIQUET:  * No tourniquets in log *  DICTATION: .Other Dictation: Dictation Number [id number]  PLAN OF CARE: Discharge to home after PACU  PATIENT DISPOSITION:  PACU - hemodynamically stable.   Delay start of Pharmacological VTE agent (>24hrs) due to surgical blood loss or risk of bleeding: NA

## 2015-09-05 NOTE — Interval H&P Note (Signed)
History and Physical Interval Note:  09/05/2015 10:29 AM  Alexis Wood  has presented today for surgery, with the diagnosis of Sclerosing lesion left breast times 2     The various methods of treatment have been discussed with the patient and family. After consideration of risks, benefits and other options for treatment, the patient has consented to  Procedure(s): LEFT BREAST WIRE LOCALIZED  LUMPECTOMY TIMES 2 (Left) as a surgical intervention .  The patient's history has been reviewed, patient examined, no change in status, stable for surgery.  I have reviewed the patient's chart and labs.  Questions were answered to the patient's satisfaction.     [name] A.

## 2015-09-05 NOTE — Anesthesia Preprocedure Evaluation (Signed)
Anesthesia Evaluation  Patient identified by MRN, date of birth, ID band Patient awake    Reviewed: Allergy & Precautions, NPO status , Patient's Chart, lab work & pertinent test results  Airway Mallampati: II  TM Distance: >3 FB Neck ROM: Full    Dental  (+) Dental Advisory Given, Teeth Intact   Pulmonary Current Smoker,    breath sounds clear to auscultation       Cardiovascular hypertension, Pt. on medications  Rhythm:Regular Rate:Normal     Neuro/Psych    GI/Hepatic   Endo/Other  Hypothyroidism   Renal/GU      Musculoskeletal   Abdominal   Peds  Hematology   Anesthesia Other Findings   Reproductive/Obstetrics                             Anesthesia Physical Anesthesia Plan  ASA: II  Anesthesia Plan: General   Post-op Pain Management:    Induction: Intravenous  Airway Management Planned: LMA  Additional Equipment:   Intra-op Plan:   Post-operative Plan: Extubation in OR  Informed Consent: I have reviewed the patients History and Physical, chart, labs and discussed the procedure including the risks, benefits and alternatives for the proposed anesthesia with the patient or authorized representative who has indicated his/her understanding and acceptance.   Dental advisory given  Plan Discussed with: CRNA, Anesthesiologist and Surgeon  Anesthesia Plan Comments:         Anesthesia Quick Evaluation

## 2015-09-05 NOTE — H&P (Signed)
H&P   Alexis SAMPSEL (MR# [id number])      H&P Info    Chartered loss adjuster Note Status Last Update User Last Update Date/Time   Alexis Bouillon, MD Signed Alexis Bouillon, MD 07/18/2015 1:30 PM    H&P    Expand All Collapse All   Ruchi Stoney 07/18/2015 10:25 AM Location: [facility] Patient #: [id number] DOB: 02/10/1956 Married / Language: [name] / Race: White Female  History of Present Illness Alexis Wood A. Saesha Llerenas MD; 07/18/2015 1:29 PM) Patient words: Patient sent at the request of Dr. Kerry Kass for left breast mass. She was found on screening mammogram to have 2 areas of the left breast 1 the left inner quadrant 1. Left outer quadrant. These were both biopsied. The upper-outer quadrant left breast lesion was as complex sclerosing lesion. The inner quadrant lesion was fat necrosis. Patient denies any history of breast pain, nipple discharge or change in the appearance of either breast.        Diagnosis 1. Breast, left, needle core biopsy, UIQ - COMPLEX SCLEROSING LESION WITH CALCIFICATIONS. - SEE COMMENT. 2. Breast, left, needle core biopsy, UOQ - COMPLEX SCLEROSING LESION. 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     CLINICAL DATA: Status post stereotactic guided scratch stat status post 3D stereotactic guided core needle biopsy of an area of architectural distortion in the upper inner quadrant of the left breast and an area of architectural distortion in the upper-outer quadrant of the left breast. EXAM: 3D DIAGNOSTIC LEFT MAMMOGRAM POST STEREOTACTIC BIOPSY COMPARISON: Previous exam(s). FINDINGS: 3D Mammographic images were obtained following stereotactic guided biopsy of areas of architectural distortion in the upper inner quadrant of the left breast and upper outer quadrant of the left breast. These demonstrate an X shaped biopsy marker clip at the expected location of the  biopsied distortion in the upper inner quadrant of the left breast. No residual distortion is visible on these images. There is also a coil shaped biopsy marker clip located 1.5 cm inferior to the center of the area of architectural distortion in the upper outer quadrant of the left breast, which remains following biopsy. IMPRESSION: 1. Appropriate deployment of an X shaped biopsy marker clip at the location of biopsied architectural distortion in the upper inner quadrant of the left breast. 2. The coil shaped biopsy marker clip is located 1.5 cm inferior to the targeted area of architectural distortion in the upper-outer quadrant of the left breast. This area was most likely not adequately sampled. This was discussed with the patient. Final Assessment: Post Procedure Mammograms for Marker Placement Electronically Signed By: Beckie Salts M.D. On: 06/30/2015 13:27 .  The patient is a 59 year old female.   Other Problems [name], CMA; 07/18/2015 10:25 AM) Anxiety Disorder Arthritis High blood pressure Thyroid Disease  Past Surgical History [name], CMA; 07/18/2015 10:25 AM) Breast Biopsy Left. Thyroid Surgery  Diagnostic Studies History [name], [place]; 07/18/2015 10:25 AM) Colonoscopy 5-10 years ago Mammogram within last year Pap Smear 1-5 years ago  Allergies [name], CMA; 07/18/2015 10:25 AM) Penicillin V *PENICILLINS* Pristiq *ANTIDEPRESSANTS* Hives.  Medication History [name], CMA; 07/18/2015 10:26 AM) ALPRAZolam (0.5MG  Tablet, Oral) Active. BuPROPion HCl ER (XL) (  Tablet ER 24HR, Oral) Active. Levothyroxine Sodium ( Tablet, Oral) Active. Losartan Potassium (  Tablet, Oral) Active. Eye Vitamins (Oral) Active. Medications Reconciled  Social History [name], [place]; 07/18/2015 10:25 AM) Caffeine use Carbonated beverages, Coffee, Tea. No alcohol use No  drug use Tobacco use Current every day smoker.  Family  History [name]([place], [place]; 07/18/2015 10:25 AM) Alcohol Abuse Father. Diabetes Mellitus Father. Heart Disease Father, Mother. Hypertension Father, Mother.  Pregnancy / Birth History [name](Ashley Beck, CMA; 07/18/2015 10:25 AM) Age at menarche 14 years. Age of menopause 3446-50 Gravida 1 Maternal age 59-35 Para 1     Review of Systems [name](Ashley Beck CMA; 07/18/2015 10:25 AM) General Present- Night Sweats. Not Present- Appetite Loss, Chills, Fatigue, Fever, Weight Gain and Weight Loss. Skin Not Present- Change in Wart/Mole, Dryness, Hives, Jaundice, New Lesions, Non-Healing Wounds, Rash and Ulcer. HEENT Present- Wears glasses/contact lenses. Not Present- Earache, Hearing Loss, Hoarseness, Nose Bleed, Oral Ulcers, Ringing in the Ears, Seasonal Allergies, Sinus Pain, Sore Throat, Visual Disturbances and Yellow Eyes. Respiratory Not Present- Bloody sputum, Chronic Cough, Difficulty Breathing, Snoring and Wheezing. Breast Not Present- Breast Mass, Breast Pain, Nipple Discharge and Skin Changes. Cardiovascular Not Present- Chest Pain, Difficulty Breathing Lying Down, Leg Cramps, Palpitations, Rapid Heart Rate, Shortness of Breath and Swelling of Extremities. Gastrointestinal Not Present- Abdominal Pain, Bloating, Bloody Stool, Change in Bowel Habits, Chronic diarrhea, Constipation, Difficulty Swallowing, Excessive gas, Gets full quickly at meals, Hemorrhoids, Indigestion, Nausea, Rectal Pain and Vomiting. Female Genitourinary Not Present- Frequency, Nocturia, Painful Urination, Pelvic Pain and Urgency. Musculoskeletal Not Present- Back Pain, Joint Pain, Joint Stiffness, Muscle Pain, Muscle Weakness and Swelling of Extremities. Neurological Not Present- Decreased Memory, Fainting, Headaches, Numbness, Seizures, Tingling, Tremor, Trouble walking and Weakness. Psychiatric Present- Anxiety. Not Present- Bipolar, Change in Sleep Pattern, Depression, Fearful and Frequent crying. Endocrine Present- Hot  flashes. Not Present- Cold Intolerance, Excessive Hunger, Hair Changes, Heat Intolerance and New Diabetes. Hematology Not Present- Easy Bruising, Excessive bleeding, Gland problems, HIV and Persistent Infections.  Vitals [name](Ashley Beck CMA; 07/18/2015 10:26 AM) 07/18/2015 10:26 AM Weight: 172 lb Height: 63in Body Surface Area: 1.81 m Body Mass Index: 30.47 kg/m  Temp.: 97.30F(Temporal)  Pulse: 102 (Regular)  Resp.: 18 (Unlabored)  BP: 138/82 (Sitting, Left Arm, Standard)      Physical Exam (Jenner Rosier A. Zong Mcquarrie MD; 07/18/2015 1:29 PM)  General Mental Status-Alert. General Appearance-Consistent with stated age. Hydration-Well hydrated. Voice-Normal.  Head and Neck Head-normocephalic, atraumatic with no lesions or palpable masses. Trachea-midline. Thyroid Gland Characteristics - normal size and consistency.  Chest and Lung Exam Chest and lung exam reveals -quiet, even and easy respiratory effort with no use of accessory muscles and on auscultation, normal breath sounds, no adventitious sounds and normal vocal resonance. Inspection Chest Wall - Normal. Back - normal.  Breast Breast - Left-Symmetric, Non Tender, No Biopsy scars, no Dimpling, No Inflammation, No Lumpectomy scars, No Mastectomy scars, No Peau d' Orange. Breast - Right-Symmetric, Non Tender, No Biopsy scars, no Dimpling, No Inflammation, No Lumpectomy scars, No Mastectomy scars, No Peau d' Orange. Breast Lump-No Palpable Breast Mass.  Cardiovascular Cardiovascular examination reveals -normal heart sounds, regular rate and rhythm with no murmurs and normal pedal pulses bilaterally.  Lymphatic Axillary  General Axillary Region: Bilateral - Description - Normal. Tenderness - Non Tender.    Assessment & Plan (Vir Whetstine A. Karolina Zamor MD; 07/18/2015 1:28 PM)  LEFT BREAST MASS (N63)  LEFT BREAST MASS (N63) Impression: RECOMMEND EXCISON OF BOTH THE INNER QUADRANT AND OUTER QUADRANT LESION  DUE TO SMALL RISK OF MALIGNANCY   Risk of lumpectomy include bleeding, infection, seroma, more surgery, use of seed/wire, wound care, cosmetic deformity and the need for other treatments, death , blood clots, death. Pt agrees to proceed.  Current Plans Pt  Education - [facility] Breast Biopsy HCI: discussed with patient and provided information. The anatomy and the physiology was discussed. The pathophysiology and natural history of the disease was discussed. Options were discussed and recommendations were made. Technique, risks, benefits, & alternatives were discussed. Risks such as stroke, heart attack, bleeding, indection, death, and other risks discussed. Questions answered. The patient agrees to proceed. You are being scheduled for surgery - Our schedulers will call you.  You should hear from our office's scheduling department within 5 working days about the location, date, and time of surgery. We try to make accommodations for patient's preferences in scheduling surgery, but sometimes the OR schedule or the surgeon's schedule prevents [place] from making those accommodations.  If you have not heard from our office [phone]) in 5 working days, call the office and ask for your surgeon's nurse.  If you have other questions about your diagnosis, plan, or surgery, call the office and ask for your surgeon's nurse.  Pt Education - [facility] Breast Biopsy HCI: discussed with patient and provided information. Pt Education - [facility] Breast Biopsy Instructions (FLB): discussed with patient and provided information.

## 2015-09-05 NOTE — Anesthesia Postprocedure Evaluation (Signed)
Anesthesia Post Note  Patient: Hortencia ConradiKaren F Doig  Procedure(s) Performed: Procedure(s) (LRB): LEFT BREAST WIRE LOCALIZED  LUMPECTOMY TIMES 2 (Left)  Patient location during evaluation: PACU Anesthesia Type: General Level of consciousness: awake and alert Pain management: pain level controlled Vital Signs Assessment: post-procedure vital signs reviewed and stable Respiratory status: spontaneous breathing, nonlabored ventilation and respiratory function stable Cardiovascular status: blood pressure returned to baseline and stable Postop Assessment: no signs of nausea or vomiting Anesthetic complications: no    Last Vitals:  Filed Vitals:   09/05/15 1300 09/05/15 1350  BP: 141/87 139/85  Pulse: 89 86  Temp:  36.7 C  Resp: 15 16    Last Pain:  Filed Vitals:   09/05/15 1422  PainSc: 4                  Giang Hemme A

## 2015-09-06 ENCOUNTER — Encounter (HOSPITAL_BASED_OUTPATIENT_CLINIC_OR_DEPARTMENT_OTHER): Payer: Self-pay | Admitting: Surgery

## 2015-09-06 NOTE — Op Note (Signed)
NAMEMARGART, ZEMANEK             ACCOUNT NO.:  [id number]  MEDICAL RECORD NO.:  [id number]  LOCATION:                               FACILITY:  [facility]  PHYSICIAN:  Marcello Moores A. Stephfon Bovey, M.D.DATE OF BIRTH:  November 27, 1955  DATE OF PROCEDURE:  09/05/2015 DATE OF DISCHARGE:  09/05/2015                              OPERATIVE REPORT   PREOPERATIVE DIAGNOSIS:  Left breast sclerosing lesions x2.  POSTOPERATIVE DIAGNOSIS:  Left breast sclerosing lesions x2.  PROCEDURE:  Left breast wire localized lumpectomy x2.  SURGEON:  Marcello Moores A. Taneasha Fuqua, M.D.  ANESTHESIA:  LMA with 0.25% Sensorcaine local with epinephrine.  EBL:  Minimal.  SPECIMEN: 1. Left lateral breast mass. 2. Left medial breast mass.  Both with the wire and clip verified by     radiograph.  DRAINS:  None.  INDICATIONS FOR PROCEDURE:  The patient is a 59 year old female, with 2 left breast sclerosing lesions, diagnosed by core biopsy in mammography. We discussed the rationale for removal, which includes a small risk of sampling error and the potential for small risk of malignancy.  She opted to have both areas removed.  Risk, benefits, and alternatives to the therapies were discussed.  Risk of bleeding, infection, seroma, cosmetic deformity, pain, blood vessel injury, nerve injury, the need for other operative procedures depending on final diagnosis discussed. Observation discussed.  She agreed to proceed.  DESCRIPTION OF PROCEDURE:  The patient was met in the holding area and the left breast was marked as the correct site.  Questions were answered.  She was taken back to the operating room and placed supine on the OR table.  After sterile prep and drape, induction of general anesthesia, the left breast was marked as correct site and time-out was done.  The lateral lesion was done first.  A curvilinear incision was made after infiltration of the skin with local anesthesia.  Wire was grabbed all tissue around the wire was  excised.  Radiograph revealed the wire and clip to be in the specimen.  In a similar fashion, a transverse incision was made in the medial left breast.  Dissection was carried down around the wire itself and the tissue around the tip was removed. Radiograph revealed the wire and clip to be in the specimen.  The cavity was irrigated and closed.  Lateral incision was closed with 3-0 Vicryl and 4-0 Monocryl.  The medial incision was closed in a similar fashion after ensuring adequate hemostasis.  Liquid adhesive applied.  Binder placed. All final counts of sponge, needle, and instruments were found to be correct at this portion of the case.  The patient was then taken to recovery after extubation in a satisfactory condition.     Damauri Minion A. Reneka Nebergall, M.D.     TAC/MEDQ  D:  09/05/2015  T:  09/05/2015  Job:  [id number]

## 2015-10-03 ENCOUNTER — Encounter: Payer: Self-pay | Admitting: Family Medicine

## 2015-10-09 ENCOUNTER — Ambulatory Visit (INDEPENDENT_AMBULATORY_CARE_PROVIDER_SITE_OTHER): Payer: 59 | Admitting: Family Medicine

## 2015-10-09 ENCOUNTER — Encounter: Payer: Self-pay | Admitting: Family Medicine

## 2015-10-09 VITALS — BP 139/88 | HR 115 | Temp 98.9°F | Ht 64.0 in | Wt 173.4 lb

## 2015-10-09 DIAGNOSIS — F411 Generalized anxiety disorder: Secondary | ICD-10-CM

## 2015-10-09 MED ORDER — BUPROPION HCL ER (XL) 300 MG PO TB24: 300.0000 mg | ORAL_TABLET | Freq: Every day | ORAL | Status: DC

## 2015-10-09 MED ORDER — ALPRAZOLAM 1 MG PO TABS: 1.0000 mg | ORAL_TABLET | Freq: Two times a day (BID) | ORAL | Status: DC | PRN

## 2015-10-09 NOTE — Patient Instructions (Signed)
Follow up in 3-4 weeks to recheck mood Increase the Wellbutrin to - 2 of what you have at home and 1 of the new prescription Use the Alprazolam as needed- I increased the dose Call and schedule an appt w/[facility]- [phone] Call with any questions or concerns Hang in there!!!

## 2015-10-09 NOTE — Progress Notes (Signed)
   Subjective:    Patient ID: Alexis Wood, female    DOB: [birth year], 60 y.o.   MRN: [id number]  HPI Anxiety- ongoing issue for pt.  Pt is POA for mother but sister and niece (current caregivers) are not 'willing to release her to me'.  Pt found a facility for mom here locally b/c family has been complaining about their inability to care for her but they don't want her in a facility.   Review of Systems For ROS see HPI     Objective:   Physical Exam  Constitutional: She is oriented to person, place, and time. She appears well-developed and well-nourished.  tearful  Neurological: She is alert and oriented to person, place, and time.  Skin: Skin is warm and dry.  Psychiatric: Her behavior is normal. Thought content normal.  Tearful, very anxious- unable to talk about mom w/o crying  Vitals reviewed.         Assessment & Plan:

## 2015-10-09 NOTE — Assessment & Plan Note (Signed)
Deteriorated.  Pt is having very difficult time w/ current family situation and need to place mom in memory care facility.  Will increase Wellbutrin to  and temporarily increase Alprazolam to  prn.  Encouraged pt to resume counseling- # provided.  Will follow closely.

## 2015-10-09 NOTE — Progress Notes (Signed)
Pre visit review using our clinic review tool, if applicable. No additional management support is needed unless otherwise documented below in the visit note. 

## 2015-11-02 ENCOUNTER — Encounter: Payer: Self-pay | Admitting: Family Medicine

## 2015-11-02 ENCOUNTER — Ambulatory Visit (INDEPENDENT_AMBULATORY_CARE_PROVIDER_SITE_OTHER): Payer: 59 | Admitting: Family Medicine

## 2015-11-02 VITALS — BP 134/96 | HR 111 | Temp 98.2°F | Ht 64.0 in | Wt 172.4 lb

## 2015-11-02 DIAGNOSIS — F411 Generalized anxiety disorder: Secondary | ICD-10-CM

## 2015-11-02 NOTE — Patient Instructions (Signed)
Follow up as scheduled Continue the Wellbutrin and Alprazolam Call to set up an appt for grief counseling w/ Hospice [phone] Call with any questions or concerns If you want to join [place] at the new [place] office, any scheduled appointments will automatically transfer and we will see you at [address] [place] [address] [place], [place], [place] [address] (OPENING [place]) [place] in there!!!

## 2015-11-02 NOTE — Progress Notes (Signed)
Pre visit review using our clinic review tool, if applicable. No additional management support is needed unless otherwise documented below in the visit note. 

## 2015-11-02 NOTE — Progress Notes (Signed)
   Subjective:    Patient ID: EMERSYN WYSS, female    DOB: 1955-12-14, 60 y.o.   MRN: [id number]  HPI Anxiety- pt reports she is doing much better than previously.  Mother is still living in [place] w/ niece.  Relationship w/ sister is strained but she is doing better.  Wellbutrin was increased to  last visit.  Is taking Alprazolam almost daily- feels 'a big difference' since increasing dose.  Pt remains tearful at night over image of father lying on floor (pt found him dead at home)   Review of Systems For ROS see HPI     Objective:   Physical Exam  Constitutional: She is oriented to person, place, and time. She appears well-developed and well-nourished. No distress.  HENT:  Head: Normocephalic and atraumatic.  Neurological: She is alert and oriented to person, place, and time.  Skin: Skin is warm and dry.  Psychiatric: Her behavior is normal. Thought content normal.  Tearful when speaking of father's death Rapid, almost pressured speech  Vitals reviewed.         Assessment & Plan:

## 2015-11-02 NOTE — Assessment & Plan Note (Signed)
Improved since changing the medication but she continues to struggle w/ her father's death.  At this time, we discussed that she needs to come to terms w/ that before her mother passes.  She is in agreement.  Plans to call Hospice for grief counseling.  Will follow.

## 2015-11-03 ENCOUNTER — Ambulatory Visit: Payer: Self-pay | Admitting: Family Medicine

## 2015-11-06 ENCOUNTER — Ambulatory Visit (INDEPENDENT_AMBULATORY_CARE_PROVIDER_SITE_OTHER): Payer: 59 | Admitting: Family

## 2015-11-06 ENCOUNTER — Encounter: Payer: Self-pay | Admitting: Family

## 2015-11-06 VITALS — BP 130/89 | HR 111 | Temp 98.4°F | Resp 18 | Ht 64.0 in | Wt 167.6 lb

## 2015-11-06 DIAGNOSIS — R6889 Other general symptoms and signs: Secondary | ICD-10-CM

## 2015-11-06 NOTE — Progress Notes (Signed)
Subjective:    Patient ID: Alexis Wood, female    DOB: Nov 03, 1955, 61 y.o.   MRN: [id number]  HPI  Alexis Wood is a 60 yr old female who presents today with c/o cough/fatigue/nausea since Thursday.  Also reports + constipation. Last BM was yesterday.  + anorexia.  Drinking pepsi. She is "exhausted."      Review of Systems See HPI  Past Medical History  Diagnosis Date  . Anxiety   . Hypertension   . Hypothyroidism   . Carpal tunnel syndrome   . Osteoarthritis     right thumb  . Macular degeneration   . Hyperlipidemia   . Breast mass, left     Social History   Social History  . Marital Status: Married    Spouse Name: N/A  . Number of Children: N/A  . Years of Education: N/A   Occupational History  . Not on file.   Social History Main Topics  . Smoking status: Current Every Day Smoker -- 0.50 packs/day    Types: Cigarettes  . Smokeless tobacco: Never Used  . Alcohol Use: No  . Drug Use: No  . Sexual Activity:    Partners: Male    Birth Control/ Protection: Post-menopausal   Other Topics Concern  . Not on file   Social History Narrative    Past Surgical History  Procedure Laterality Date  . Thyroidectomy  Age 21  . Dilatation & currettage/hysteroscopy with resectocope  02/2006    benign polyp  . Breast lumpectomy with needle localization Left 09/05/2015    Procedure: LEFT BREAST WIRE LOCALIZED  LUMPECTOMY TIMES 2;  Surgeon: Harriette Bouillon, MD;  Location: [facility];  Service: General;  Laterality: Left;    Family History  Problem Relation Age of Onset  . Coronary artery disease Mother   . Hypertension Mother   . Dementia Mother   . Coronary artery disease Father   . Hypertension Father   . Diabetes Father   . Breast cancer Maternal Aunt   . Diabetes type I      Allergies  Allergen Reactions  . Clindamycin/Lincomycin Nausea Only and Other (See Comments)    Diarrhea   . Penicillins Hives  . Pristiq [Desvenlafaxine] Hives     Current Outpatient Prescriptions on File Prior to Visit  Medication Sig Dispense Refill  . ALPRAZolam (XANAX) 1 MG tablet Take 1 tablet (1 mg total) by mouth 2 (two) times daily as needed for anxiety. 60 tablet 3  . buPROPion (WELLBUTRIN XL) 300 MG 24 hr tablet Take 1 tablet (300 mg total) by mouth daily. 30 tablet 6  . cholecalciferol (VITAMIN D) 1000 UNITS tablet Take 2,000 Units by mouth daily.    Marland Kitchen levothyroxine (SYNTHROID, LEVOTHROID) 112 MCG tablet Take 1 tablet (112 mcg total) by mouth daily. 90 tablet 1  . losartan (COZAAR) 50 MG tablet Take 50 mg by mouth daily.  5  . Multiple Vitamins-Minerals (EYE VITAMINS) CAPS Take by mouth.     No current facility-administered medications on file prior to visit.    BP 130/89 mmHg  Pulse 111  Temp(Src) 98.4 F (36.9 C) (Oral)  Resp 18  Ht  (1.626 m)  Wt 167 lb 9.6 oz (76.023 kg)  BMI 28.75 kg/m2  SpO2 99%  LMP 03/09/2004       Objective:   Physical Exam  Constitutional: She is oriented to person, place, and time. She appears well-developed and well-nourished.  HENT:  Head: Normocephalic and atraumatic.  Right Ear: Tympanic membrane and ear canal normal.  Left Ear: Tympanic membrane and ear canal normal.  Mouth/Throat: No oropharyngeal exudate, posterior oropharyngeal edema or posterior oropharyngeal erythema.  Cardiovascular: Normal rate, regular rhythm and normal heart sounds.   No murmur heard. Pulmonary/Chest: Effort normal and breath sounds normal. No respiratory distress. She has no wheezes.  Abdominal: Soft. She exhibits no distension. There is no tenderness.  Lymphadenopathy:    She has no cervical adenopathy.  Neurological: She is alert and oriented to person, place, and time.  Skin: Skin is warm and dry.  Psychiatric: She has a normal mood and affect. Her behavior is normal. Judgment and thought content normal.          Assessment & Plan:  Flu-like illness- she is outside tamiflu window.  Declines flu  swab.  Advised pt on supportive measures. Advised pt to please call if symptoms worsen, if fever >101, or if not improved in 3-4 days. We discussed ibuprofen as needed for pain/fever, mucinex as needed for chest congestion delsym as needed for cough suppression.

## 2015-11-06 NOTE — Progress Notes (Signed)
Pre visit review using our clinic review tool, if applicable. No additional management support is needed unless otherwise documented below in the visit note. 

## 2015-11-06 NOTE — Patient Instructions (Signed)
Please call if symptoms worsen, if fever >101, or if not improved in 3-4 days.  Drink plenty of fluids and rest as much as possible.

## 2015-11-16 ENCOUNTER — Other Ambulatory Visit: Payer: Self-pay | Admitting: Family Medicine

## 2015-11-16 NOTE — Telephone Encounter (Signed)
Medication filled to pharmacy as requested.   

## 2015-12-25 ENCOUNTER — Encounter: Payer: Self-pay | Admitting: Family Medicine

## 2015-12-28 ENCOUNTER — Ambulatory Visit: Payer: 59 | Admitting: Medical

## 2016-01-02 ENCOUNTER — Encounter: Payer: Self-pay | Admitting: Family Medicine

## 2016-02-09 ENCOUNTER — Encounter: Payer: Self-pay | Admitting: Family Medicine

## 2016-02-09 ENCOUNTER — Ambulatory Visit (INDEPENDENT_AMBULATORY_CARE_PROVIDER_SITE_OTHER): Payer: 59 | Admitting: Family Medicine

## 2016-02-09 VITALS — BP 125/83 | HR 80 | Temp 97.9°F | Resp 16 | Ht 64.0 in | Wt 174.4 lb

## 2016-02-09 DIAGNOSIS — E039 Hypothyroidism, unspecified: Secondary | ICD-10-CM

## 2016-02-09 DIAGNOSIS — E663 Overweight: Secondary | ICD-10-CM

## 2016-02-09 DIAGNOSIS — I10 Essential (primary) hypertension: Secondary | ICD-10-CM | POA: Diagnosis not present

## 2016-02-09 NOTE — Progress Notes (Signed)
Pre visit review using our clinic review tool, if applicable. No additional management support is needed unless otherwise documented below in the visit note. 

## 2016-02-09 NOTE — Progress Notes (Signed)
   Subjective:    Patient ID: Alexis ConradiKaren F Wood, female    DOB: 1956-03-09, 60 y.o.   MRN: [id number]  HPI HTN- chronic problem, on Losartan daily w/ good BP control.  Pt is frustrated by recent weight gain but admits to eating poorly and not exercising.  No CP, SOB, HAs, edema.  + visual changes due to macular degeneration  Hypothyroid- chronic problem, on Levothyroxine 112mcg daily.  No current fatigue, changes to skin/hair/nails.  No constipation/diarrhea.   Review of Systems For ROS see HPI     Objective:   Physical Exam  Constitutional: She is oriented to person, place, and time. She appears well-developed and well-nourished. No distress.  HENT:  Head: Normocephalic and atraumatic.  Eyes: Conjunctivae and EOM are normal. Pupils are equal, round, and reactive to light.  Neck: Normal range of motion. Neck supple. No thyromegaly present.  Cardiovascular: Normal rate, regular rhythm, normal heart sounds and intact distal pulses.   No murmur heard. Pulmonary/Chest: Effort normal and breath sounds normal. No respiratory distress.  Abdominal: Soft. She exhibits no distension. There is no tenderness.  Musculoskeletal: She exhibits no edema.  Lymphadenopathy:    She has no cervical adenopathy.  Neurological: She is alert and oriented to person, place, and time.  Skin: Skin is warm and dry.  Psychiatric: She has a normal mood and affect. Her behavior is normal.  Vitals reviewed.         Assessment & Plan:

## 2016-02-09 NOTE — Assessment & Plan Note (Signed)
Ongoing issue for pt.  She continues to gain weight but admits that she is not eating well nor exercising.  Stressed the importance of both.  Will continue to follow.

## 2016-02-09 NOTE — Patient Instructions (Signed)
[facility] Schedule your complete physical with me in November Continue to work on Alt[employer] Grouphealthy diet and regular exercise- you can do it!!! Call with any questions or concerns Happy Memorial Day!!!

## 2016-02-09 NOTE — Assessment & Plan Note (Signed)
Chronic problem for pt.  Feeling well since having her meds adjusted.  Asymptomatic at this time.  Due for repeat labs and refills will be provided assuming labs are normal and no changes are needed.

## 2016-02-09 NOTE — Assessment & Plan Note (Signed)
Chronic problem.  BP is well controlled on Losartan.  Asymptomatic.  Check BMP but no anticipated med changes.  Pt expressed understanding and is in agreement w/ plan.

## 2016-02-13 ENCOUNTER — Other Ambulatory Visit: Payer: Self-pay

## 2016-02-14 ENCOUNTER — Other Ambulatory Visit (INDEPENDENT_AMBULATORY_CARE_PROVIDER_SITE_OTHER): Payer: 59

## 2016-02-14 DIAGNOSIS — E039 Hypothyroidism, unspecified: Secondary | ICD-10-CM | POA: Diagnosis not present

## 2016-02-14 DIAGNOSIS — I10 Essential (primary) hypertension: Secondary | ICD-10-CM | POA: Diagnosis not present

## 2016-02-14 LAB — CBC WITH DIFFERENTIAL/PLATELET
BASOS ABS: 0.1 10*3/uL (ref 0.0–0.1)
BASOS PCT: 0.5 % (ref 0.0–3.0)
EOS ABS: 0.4 10*3/uL (ref 0.0–0.7)
Eosinophils Relative: 3.3 % (ref 0.0–5.0)
HEMATOCRIT: 45 % (ref 36.0–46.0)
HEMOGLOBIN: 15.2 g/dL — AB (ref 12.0–15.0)
LYMPHS PCT: 34.6 % (ref 12.0–46.0)
Lymphs Abs: 4 10*3/uL (ref 0.7–4.0)
MCHC: 33.8 g/dL (ref 30.0–36.0)
MCV: 86.9 fl (ref 78.0–100.0)
MONOS PCT: 4.8 % (ref 3.0–12.0)
Monocytes Absolute: 0.5 10*3/uL (ref 0.1–1.0)
Neutro Abs: 6.5 10*3/uL (ref 1.4–7.7)
Neutrophils Relative %: 56.8 % (ref 43.0–77.0)
Platelets: 205 10*3/uL (ref 150.0–400.0)
RBC: 5.18 Mil/uL — ABNORMAL HIGH (ref 3.87–5.11)
RDW: 12.5 % (ref 11.5–15.5)
WBC: 11.5 10*3/uL — AB (ref 4.0–10.5)

## 2016-02-14 LAB — BASIC METABOLIC PANEL
BUN: 17 mg/dL (ref 6–23)
CALCIUM: 9.4 mg/dL (ref 8.4–10.5)
CO2: 30 meq/L (ref 19–32)
Chloride: 103 mEq/L (ref 96–112)
Creatinine, Ser: 0.94 mg/dL (ref 0.40–1.20)
GFR: 64.63 mL/min (ref >60.00)
GLUCOSE: 102 mg/dL — AB (ref 70–99)
POTASSIUM: 4 meq/L (ref 3.5–5.1)
SODIUM: 138 meq/L (ref 135–145)

## 2016-02-14 LAB — TSH: TSH: 0.72 u[IU]/mL (ref 0.35–4.50)

## 2016-02-15 ENCOUNTER — Encounter: Payer: Self-pay | Admitting: Family Medicine

## 2016-02-23 ENCOUNTER — Encounter: Payer: Self-pay | Admitting: Family Medicine

## 2016-02-23 MED ORDER — LEVOTHYROXINE SODIUM 112 MCG PO TABS: 112.0000 ug | ORAL_TABLET | Freq: Every day | ORAL | Status: DC

## 2016-02-23 NOTE — Telephone Encounter (Signed)
Medication filled to pharmacy as requested.   

## 2016-05-14 ENCOUNTER — Encounter: Payer: Self-pay | Admitting: Family Medicine

## 2016-05-14 ENCOUNTER — Other Ambulatory Visit: Payer: Self-pay | Admitting: General Practice

## 2016-05-14 MED ORDER — BUPROPION HCL ER (XL) 300 MG PO TB24: 300.0000 mg | ORAL_TABLET | Freq: Every day | ORAL | 6 refills | Status: DC

## 2016-05-29 ENCOUNTER — Other Ambulatory Visit: Payer: Self-pay | Admitting: Family Medicine

## 2016-05-29 NOTE — Telephone Encounter (Signed)
Last OV 02/09/16 Alprazolam last filled 10/09/15 #60 with 3

## 2016-05-29 NOTE — Telephone Encounter (Signed)
Medication filled to pharmacy as requested.   

## 2016-06-10 LAB — HM DEXA SCAN: HM DEXA SCAN: NORMAL

## 2016-06-10 LAB — HM MAMMOGRAPHY: HM MAMMO: NORMAL (ref 0–4)

## 2016-06-16 ENCOUNTER — Other Ambulatory Visit: Payer: Self-pay | Admitting: Family Medicine

## 2016-07-19 ENCOUNTER — Other Ambulatory Visit: Payer: Self-pay | Admitting: Family Medicine

## 2016-08-02 ENCOUNTER — Encounter: Payer: Self-pay | Admitting: Family Medicine

## 2016-08-02 ENCOUNTER — Ambulatory Visit (INDEPENDENT_AMBULATORY_CARE_PROVIDER_SITE_OTHER): Payer: 59 | Admitting: Family Medicine

## 2016-08-02 VITALS — BP 119/81 | HR 74 | Temp 98.1°F | Resp 16 | Ht 64.0 in | Wt 172.0 lb

## 2016-08-02 DIAGNOSIS — Z23 Encounter for immunization: Secondary | ICD-10-CM

## 2016-08-02 DIAGNOSIS — Z0000 Encounter for general adult medical examination without abnormal findings: Secondary | ICD-10-CM | POA: Diagnosis not present

## 2016-08-02 LAB — LIPID PANEL
CHOLESTEROL: 182 mg/dL (ref 0–200)
HDL: 65.3 mg/dL (ref >39.00)
LDL Cholesterol: 91 mg/dL (ref 0–99)
NONHDL: 117.08
TRIGLYCERIDES: 128 mg/dL (ref 0.0–149.0)
Total CHOL/HDL Ratio: 3
VLDL: 25.6 mg/dL (ref 0.0–40.0)

## 2016-08-02 LAB — BASIC METABOLIC PANEL
BUN: 16 mg/dL (ref 6–23)
CHLORIDE: 101 meq/L (ref 96–112)
CO2: 30 meq/L (ref 19–32)
CREATININE: 0.93 mg/dL (ref 0.40–1.20)
Calcium: 9.7 mg/dL (ref 8.4–10.5)
GFR: 65.33 mL/min (ref >60.00)
GLUCOSE: 73 mg/dL (ref 70–99)
Potassium: 4.2 mEq/L (ref 3.5–5.1)
Sodium: 140 mEq/L (ref 135–145)

## 2016-08-02 LAB — CBC WITH DIFFERENTIAL/PLATELET
BASOS PCT: 0.4 % (ref 0.0–3.0)
Basophils Absolute: 0 10*3/uL (ref 0.0–0.1)
EOS ABS: 0.4 10*3/uL (ref 0.0–0.7)
Eosinophils Relative: 3.2 % (ref 0.0–5.0)
HCT: 45.3 % (ref 36.0–46.0)
HEMOGLOBIN: 15.4 g/dL — AB (ref 12.0–15.0)
Lymphocytes Relative: 39.6 % (ref 12.0–46.0)
Lymphs Abs: 4.4 10*3/uL — ABNORMAL HIGH (ref 0.7–4.0)
MCHC: 33.9 g/dL (ref 30.0–36.0)
MCV: 86.7 fl (ref 78.0–100.0)
MONO ABS: 0.7 10*3/uL (ref 0.1–1.0)
Monocytes Relative: 6.2 % (ref 3.0–12.0)
Neutro Abs: 5.6 10*3/uL (ref 1.4–7.7)
Neutrophils Relative %: 50.6 % (ref 43.0–77.0)
PLATELETS: 204 10*3/uL (ref 150.0–400.0)
RBC: 5.22 Mil/uL — ABNORMAL HIGH (ref 3.87–5.11)
RDW: 12.6 % (ref 11.5–15.5)
WBC: 11.1 10*3/uL — AB (ref 4.0–10.5)

## 2016-08-02 LAB — TSH: TSH: 0.55 u[IU]/mL (ref 0.35–4.50)

## 2016-08-02 LAB — HEPATIC FUNCTION PANEL
ALT: 28 U/L (ref 0–35)
AST: 19 U/L (ref 0–37)
Albumin: 4.6 g/dL (ref 3.5–5.2)
Alkaline Phosphatase: 79 U/L (ref 39–117)
BILIRUBIN TOTAL: 0.6 mg/dL (ref 0.2–1.2)
Bilirubin, Direct: 0.1 mg/dL (ref 0.0–0.3)
Total Protein: 7 g/dL (ref 6.0–8.3)

## 2016-08-02 LAB — VITAMIN D 25 HYDROXY (VIT D DEFICIENCY, FRACTURES): VITD: 43.7 ng/mL (ref 30.00–100.00)

## 2016-08-02 NOTE — Progress Notes (Signed)
   Subjective:    Patient ID: Alexis ConradiKaren F Wood, female    DOB: 1956/02/13, 60 y.o.   MRN: [id number]  HPI CPE- UTD on mammo, pap, DEXA, colonoscopy (due 2019).  UTD on flu.  Due for Zoster.   Review of Systems Patient reports no vision/ hearing changes, adenopathy,fever, weight change,  persistant/recurrent hoarseness , swallowing issues, chest pain, palpitations, edema, persistant/recurrent cough, hemoptysis, dyspnea (rest/exertional/paroxysmal nocturnal), gastrointestinal bleeding (melena, rectal bleeding), abdominal pain, significant heartburn, bowel changes, GU symptoms (dysuria, hematuria, incontinence), Gyn symptoms (abnormal  bleeding, pain),  syncope, focal weakness, memory loss, numbness & tingling, skin/hair/nail changes, abnormal bruising or bleeding, anxiety, or depression.     Objective:   Physical Exam General Appearance:    Alert, cooperative, no distress, appears stated age  Head:    Normocephalic, without obvious abnormality, atraumatic  Eyes:    PERRL, conjunctiva/corneas clear, EOM's intact, fundi    benign, both eyes  Ears:    Normal TM's and external ear canals, both ears  Nose:   Nares normal, septum midline, mucosa normal, no drainage    or sinus tenderness  Throat:   Lips, mucosa, and tongue normal; teeth and gums normal  Neck:   Supple, symmetrical, trachea midline, no adenopathy;    Thyroid: no enlargement/tenderness/nodules  Back:     Symmetric, no curvature, ROM normal, no CVA tenderness  Lungs:     Clear to auscultation bilaterally, respirations unlabored  Chest Wall:    No tenderness or deformity   Heart:    Regular rate and rhythm, S1 and S2 normal, no murmur, rub   or gallop  Breast Exam:    Deferred to GYN  Abdomen:     Soft, non-tender, bowel sounds active all four quadrants,    no masses, no organomegaly  Genitalia:    Deferred to GYN  Rectal:    Extremities:   Extremities normal, atraumatic, no cyanosis or edema  Pulses:   2+ and symmetric all  extremities  Skin:   Skin color, texture, turgor normal, no rashes or lesions  Lymph nodes:   Cervical, supraclavicular, and axillary nodes normal  Neurologic:   CNII-XII intact, normal strength, sensation and reflexes    throughout          Assessment & Plan:

## 2016-08-02 NOTE — Addendum Note (Signed)
Addended by: Geannie RisenBRODMERKEL, Casia Corti L on: 08/02/2016 02:22 PM   Modules accepted: Orders

## 2016-08-02 NOTE — Progress Notes (Signed)
Pre visit review using our clinic review tool, if applicable. No additional management support is needed unless otherwise documented below in the visit note. 

## 2016-08-02 NOTE — Patient Instructions (Addendum)
Follow up in 6 months to recheck BP We'll notify you of your lab results and make any changes if needed Keep up the good work on healthy diet and regular exercise- you look great! Call with any questions or concerns Happy Holidays!!! 

## 2016-08-02 NOTE — Assessment & Plan Note (Signed)
Pt's PE WNL w/ exception of being overweight.  UTD on mammo, pap, DEXA, colonoscopy.  Shingles shot given today.  Check labs.  Anticipatory guidance provided.

## 2016-08-05 ENCOUNTER — Telehealth: Payer: Self-pay | Admitting: Family Medicine

## 2016-08-05 NOTE — Telephone Encounter (Signed)
Patient notified of PCP recommendations and is agreement and expresses an understanding.  

## 2016-08-05 NOTE — Telephone Encounter (Signed)
Patient Name: Justin MendKAREN Mapes  DOB: March 16, 1956    Initial Comment got a shingles shot friday, has red large hard bump at the injection shot, itches   Nurse Assessment  Nurse: Stefano GaulStringer, RN, Dwana CurdVera Date/Time (Eastern Time): 08/05/2016 9:29:31 AM  Confirm and document reason for call. If symptomatic, describe symptoms. You must click the next button to save text entered. ---Caller states she had shingles immunization in her arm. Has large red bump that is 2 inches long at the injection that is itchy. No pain.  Has the patient traveled out of the country within the last 30 days? ---Not Applicable  Does the patient have any new or worsening symptoms? ---Yes  Will a triage be completed? ---Yes  Related visit to physician within the last 2 weeks? ---No  Does the PT have any chronic conditions? (i.e. diabetes, asthma, etc.) ---No  Is this a behavioral health or substance abuse call? ---No     Guidelines    Guideline Title Affirmed Question Affirmed Notes  Immunization Reactions Shingles (Herpes zoster; Zostavax) vaccine reactions (all triage questions negative)    Final Disposition User   [facility] [place]er, RN, Dwana CurdVera    Disagree/Comply: Comply

## 2016-08-05 NOTE — Telephone Encounter (Signed)
Patient called stating that she was given a shingles shot and now she has a large red hard bump at the injection site. She thinks it may be cellulitis. Patient is requesting to speak with a nurse. Sent to [facility].

## 2016-08-05 NOTE — Telephone Encounter (Signed)
This is a normal local reaction but can be very uncomfortable.  Benadryl as needed for itching, warm compresses or ice to the area (whichever feels better)

## 2016-08-19 ENCOUNTER — Other Ambulatory Visit: Payer: Self-pay | Admitting: Family Medicine

## 2016-08-19 ENCOUNTER — Encounter: Payer: Self-pay | Admitting: Family Medicine

## 2016-08-19 NOTE — Telephone Encounter (Signed)
Last OV 08/02/16 Alprazolam last filled 05/29/16 #60 with 0

## 2016-08-20 ENCOUNTER — Encounter: Payer: Self-pay | Admitting: Family Medicine

## 2016-08-20 MED ORDER — ALPRAZOLAM 1 MG PO TABS: 1.0000 mg | ORAL_TABLET | Freq: Two times a day (BID) | ORAL | 0 refills | Status: DC | PRN

## 2016-08-20 NOTE — Telephone Encounter (Signed)
Medication filled to pharmacy as requested.   

## 2016-08-23 ENCOUNTER — Other Ambulatory Visit: Payer: Self-pay | Admitting: Family Medicine

## 2016-08-28 ENCOUNTER — Encounter: Payer: Self-pay | Admitting: Family Medicine

## 2016-09-18 ENCOUNTER — Encounter: Payer: Self-pay | Admitting: Family Medicine

## 2016-09-30 ENCOUNTER — Other Ambulatory Visit: Payer: Self-pay | Admitting: Family Medicine

## 2016-09-30 ENCOUNTER — Telehealth: Payer: Self-pay | Admitting: Family Medicine

## 2016-09-30 MED ORDER — ALPRAZOLAM 1 MG PO TABS: 1.0000 mg | ORAL_TABLET | Freq: Two times a day (BID) | ORAL | 1 refills | Status: DC | PRN

## 2016-09-30 NOTE — Telephone Encounter (Signed)
Last filled 08/20/16  Last office visit 07/2016, followup scheduled for 01/2017  Okay to refill and fax to pharmacy?

## 2016-09-30 NOTE — Telephone Encounter (Signed)
Ok for #60, 1 refill 

## 2016-09-30 NOTE — Telephone Encounter (Signed)
Pt called to see if Dr. Beverely Lowabori filled ALPRAZolam Prudy Feeler(XANAX) 1 MG tablet rx [id number] [place] - [phone] [facility] [address] [address]. Please call pt at [phone] once filled to go pick up.   Thanks.

## 2016-09-30 NOTE — Telephone Encounter (Signed)
Medication filled and faxed to pharmacy. Patient aware.

## 2016-10-01 ENCOUNTER — Telehealth: Payer: Self-pay | Admitting: Family Medicine

## 2016-10-01 NOTE — Telephone Encounter (Signed)
Order fixed with pharmacy, patient aware.

## 2016-10-01 NOTE — Telephone Encounter (Signed)
Pt states that [facility] did not receive Rx refill on xanax and asking if we could resend it. Pt would like a call back when this has been done at work [phone].

## 2016-10-07 ENCOUNTER — Ambulatory Visit (INDEPENDENT_AMBULATORY_CARE_PROVIDER_SITE_OTHER): Payer: 59 | Admitting: Psychology

## 2016-10-07 DIAGNOSIS — F331 Major depressive disorder, recurrent, moderate: Secondary | ICD-10-CM

## 2016-10-12 ENCOUNTER — Other Ambulatory Visit: Payer: Self-pay | Admitting: Family Medicine

## 2016-10-13 ENCOUNTER — Other Ambulatory Visit: Payer: Self-pay | Admitting: Family Medicine

## 2016-10-14 ENCOUNTER — Encounter: Payer: Self-pay | Admitting: Family Medicine

## 2016-10-17 DIAGNOSIS — H353221 Exudative age-related macular degeneration, left eye, with active choroidal neovascularization: Secondary | ICD-10-CM | POA: Diagnosis not present

## 2016-10-29 ENCOUNTER — Ambulatory Visit (INDEPENDENT_AMBULATORY_CARE_PROVIDER_SITE_OTHER): Payer: 59 | Admitting: Psychology

## 2016-10-29 DIAGNOSIS — F331 Major depressive disorder, recurrent, moderate: Secondary | ICD-10-CM

## 2016-11-16 ENCOUNTER — Other Ambulatory Visit: Payer: Self-pay | Admitting: Family Medicine

## 2016-11-18 ENCOUNTER — Encounter: Payer: Self-pay | Admitting: Family Medicine

## 2016-11-18 ENCOUNTER — Other Ambulatory Visit: Payer: Self-pay | Admitting: Family Medicine

## 2016-12-08 ENCOUNTER — Other Ambulatory Visit: Payer: Self-pay | Admitting: Family Medicine

## 2016-12-11 DIAGNOSIS — H353130 Nonexudative age-related macular degeneration, bilateral, stage unspecified: Secondary | ICD-10-CM | POA: Diagnosis not present

## 2016-12-11 DIAGNOSIS — H353230 Exudative age-related macular degeneration, bilateral, stage unspecified: Secondary | ICD-10-CM | POA: Diagnosis not present

## 2016-12-19 DIAGNOSIS — H353132 Nonexudative age-related macular degeneration, bilateral, intermediate dry stage: Secondary | ICD-10-CM | POA: Diagnosis not present

## 2016-12-19 DIAGNOSIS — H353221 Exudative age-related macular degeneration, left eye, with active choroidal neovascularization: Secondary | ICD-10-CM | POA: Diagnosis not present

## 2016-12-24 ENCOUNTER — Encounter: Payer: Self-pay | Admitting: Family Medicine

## 2016-12-24 NOTE — Telephone Encounter (Signed)
Last OV 08/02/16 (CPE) Alprazolam last filled 09/30/16 #60 with 1

## 2016-12-25 MED ORDER — ALPRAZOLAM 1 MG PO TABS: 1.0000 mg | ORAL_TABLET | Freq: Two times a day (BID) | ORAL | 1 refills | Status: DC | PRN

## 2016-12-25 NOTE — Telephone Encounter (Signed)
Medication filled to pharmacy as requested.   

## 2017-01-11 ENCOUNTER — Other Ambulatory Visit: Payer: Self-pay | Admitting: Family Medicine

## 2017-01-15 ENCOUNTER — Ambulatory Visit (INDEPENDENT_AMBULATORY_CARE_PROVIDER_SITE_OTHER): Payer: 59 | Admitting: Psychology

## 2017-01-15 DIAGNOSIS — F331 Major depressive disorder, recurrent, moderate: Secondary | ICD-10-CM | POA: Diagnosis not present

## 2017-02-07 ENCOUNTER — Encounter: Payer: Self-pay | Admitting: Family Medicine

## 2017-02-07 ENCOUNTER — Ambulatory Visit (INDEPENDENT_AMBULATORY_CARE_PROVIDER_SITE_OTHER): Payer: 59 | Admitting: Family Medicine

## 2017-02-07 VITALS — BP 121/80 | HR 79 | Temp 98.0°F | Resp 16 | Ht 64.0 in | Wt 169.2 lb

## 2017-02-07 DIAGNOSIS — Z1159 Encounter for screening for other viral diseases: Secondary | ICD-10-CM

## 2017-02-07 DIAGNOSIS — I10 Essential (primary) hypertension: Secondary | ICD-10-CM

## 2017-02-07 DIAGNOSIS — E039 Hypothyroidism, unspecified: Secondary | ICD-10-CM | POA: Diagnosis not present

## 2017-02-07 LAB — CBC WITH DIFFERENTIAL/PLATELET
BASOS PCT: 0 %
Basophils Absolute: 0 cells/uL (ref 0–200)
EOS ABS: 324 {cells}/uL (ref 15–500)
EOS PCT: 3 %
HCT: 44.7 % (ref 35.0–45.0)
Hemoglobin: 15.3 g/dL (ref 11.7–15.5)
Lymphocytes Relative: 36 %
Lymphs Abs: 3888 cells/uL (ref 850–3900)
MCH: 29.7 pg (ref 27.0–33.0)
MCHC: 34.2 g/dL (ref 32.0–36.0)
MCV: 86.8 fL (ref 80.0–100.0)
MONOS PCT: 6 %
MPV: 12.1 fL (ref 7.5–12.5)
Monocytes Absolute: 648 cells/uL (ref 200–950)
NEUTROS ABS: 5940 {cells}/uL (ref 1500–7800)
Neutrophils Relative %: 55 %
PLATELETS: 211 10*3/uL (ref 140–400)
RBC: 5.15 MIL/uL — AB (ref 3.80–5.10)
RDW: 12.7 % (ref 11.0–15.0)
WBC: 10.8 10*3/uL (ref 3.8–10.8)

## 2017-02-07 LAB — TSH: TSH: 1.13 mIU/L

## 2017-02-07 NOTE — Progress Notes (Signed)
   Subjective:    Patient ID: Alexis ConradiKaren F Wood, female    DOB: [birth year], 61 y.o.   MRN: [id number]  HPI HTN- chronic problem, on Losartan w/ good control.  Pt has lost 3 lbs since last visit.  Denies CP, SOB, HAs, visual changes, edema.  Hypothyroid- chronic problem, on Levothyroxine 112 mcg.  Denies excessive fatigue.  No changes to skin/hair/nails.  No diarrhea/constipation.  Due for Tdap- pt declines.   Review of Systems For ROS see HPI     Objective:   Physical Exam  Constitutional: She is oriented to person, place, and time. She appears well-developed and well-nourished. No distress.  HENT:  Head: Normocephalic and atraumatic.  Eyes: Conjunctivae and EOM are normal. Pupils are equal, round, and reactive to light.  Neck: Normal range of motion. Neck supple. No thyromegaly present.  Cardiovascular: Normal rate, regular rhythm, normal heart sounds and intact distal pulses.   No murmur heard. Pulmonary/Chest: Effort normal and breath sounds normal. No respiratory distress.  Abdominal: Soft. She exhibits no distension. There is no tenderness.  Musculoskeletal: She exhibits no edema.  Lymphadenopathy:    She has no cervical adenopathy.  Neurological: She is alert and oriented to person, place, and time.  Skin: Skin is warm and dry.  Psychiatric: She has a normal mood and affect. Her behavior is normal.  Vitals reviewed.         Assessment & Plan:

## 2017-02-07 NOTE — Progress Notes (Signed)
Pre visit review using our clinic review tool, if applicable. No additional management support is needed unless otherwise documented below in the visit note. 

## 2017-02-07 NOTE — Patient Instructions (Addendum)
Schedule your complete physical in 6 months We'll notify you of your lab results and make any changes if needed Continue to work on healthy diet and regular exercise- you can do it! Call with any questions or concerns Have a great summer!!! 

## 2017-02-08 LAB — BASIC METABOLIC PANEL
BUN: 12 mg/dL (ref 7–25)
CALCIUM: 9.9 mg/dL (ref 8.6–10.4)
CO2: 28 mmol/L (ref 20–31)
Chloride: 99 mmol/L (ref 98–110)
Creat: 0.94 mg/dL (ref 0.50–0.99)
Glucose, Bld: 88 mg/dL (ref 65–99)
Potassium: 4.4 mmol/L (ref 3.5–5.3)
SODIUM: 138 mmol/L (ref 135–146)

## 2017-02-08 LAB — HEPATITIS C ANTIBODY: HCV AB: NEGATIVE

## 2017-02-11 ENCOUNTER — Other Ambulatory Visit: Payer: Self-pay | Admitting: Family Medicine

## 2017-02-11 NOTE — Assessment & Plan Note (Signed)
Chronic problem.  Currently well controlled on Losartan.  Asymptomatic.  Check labs.  No anticipated med changes.  Will follow.

## 2017-02-11 NOTE — Telephone Encounter (Signed)
Medication filled to pharmacy as requested.   

## 2017-02-11 NOTE — Assessment & Plan Note (Signed)
Chronic problem.  Currently asymptomatic on Levothyroxine 112mcg daily.  Check labs.  Adjust meds prn  

## 2017-02-27 DIAGNOSIS — H353112 Nonexudative age-related macular degeneration, right eye, intermediate dry stage: Secondary | ICD-10-CM | POA: Diagnosis not present

## 2017-02-27 DIAGNOSIS — H353222 Exudative age-related macular degeneration, left eye, with inactive choroidal neovascularization: Secondary | ICD-10-CM | POA: Diagnosis not present

## 2017-02-27 DIAGNOSIS — H353123 Nonexudative age-related macular degeneration, left eye, advanced atrophic without subfoveal involvement: Secondary | ICD-10-CM | POA: Diagnosis not present

## 2017-03-14 ENCOUNTER — Other Ambulatory Visit: Payer: Self-pay | Admitting: Family Medicine

## 2017-03-27 ENCOUNTER — Encounter: Payer: Self-pay | Admitting: Family Medicine

## 2017-03-27 ENCOUNTER — Other Ambulatory Visit: Payer: Self-pay | Admitting: Emergency Medicine

## 2017-03-27 MED ORDER — ALPRAZOLAM 1 MG PO TABS: 1.0000 mg | ORAL_TABLET | Freq: Two times a day (BID) | ORAL | 1 refills | Status: DC | PRN

## 2017-04-10 ENCOUNTER — Encounter: Payer: Self-pay | Admitting: Family Medicine

## 2017-04-10 MED ORDER — LOSARTAN POTASSIUM 50 MG PO TABS: ORAL_TABLET | ORAL | 1 refills | Status: DC

## 2017-04-10 MED ORDER — LEVOTHYROXINE SODIUM 112 MCG PO TABS: ORAL_TABLET | ORAL | 1 refills | Status: DC

## 2017-04-24 DIAGNOSIS — H353222 Exudative age-related macular degeneration, left eye, with inactive choroidal neovascularization: Secondary | ICD-10-CM | POA: Diagnosis not present

## 2017-04-24 DIAGNOSIS — H2511 Age-related nuclear cataract, right eye: Secondary | ICD-10-CM | POA: Diagnosis not present

## 2017-04-24 DIAGNOSIS — H353123 Nonexudative age-related macular degeneration, left eye, advanced atrophic without subfoveal involvement: Secondary | ICD-10-CM | POA: Diagnosis not present

## 2017-04-30 ENCOUNTER — Encounter: Payer: Self-pay | Admitting: Family Medicine

## 2017-06-09 ENCOUNTER — Encounter: Payer: Self-pay | Admitting: Family Medicine

## 2017-06-14 DIAGNOSIS — Z23 Encounter for immunization: Secondary | ICD-10-CM | POA: Diagnosis not present

## 2017-06-16 DIAGNOSIS — Z01419 Encounter for gynecological examination (general) (routine) without abnormal findings: Secondary | ICD-10-CM | POA: Diagnosis not present

## 2017-06-16 LAB — HM MAMMOGRAPHY: HM MAMMO: NORMAL (ref 0–4)

## 2017-06-16 LAB — HM PAP SMEAR

## 2017-06-19 ENCOUNTER — Encounter: Payer: Self-pay | Admitting: Family Medicine

## 2017-06-19 NOTE — Telephone Encounter (Signed)
Last OV 02/07/17 Alprazolam last filled 03/27/17 #60 with 1

## 2017-06-20 MED ORDER — ALPRAZOLAM 1 MG PO TABS: 1.0000 mg | ORAL_TABLET | Freq: Two times a day (BID) | ORAL | 0 refills | Status: DC | PRN

## 2017-06-23 ENCOUNTER — Other Ambulatory Visit: Payer: Self-pay | Admitting: Physician Assistant

## 2017-06-23 ENCOUNTER — Encounter: Payer: Self-pay | Admitting: Family Medicine

## 2017-07-14 ENCOUNTER — Encounter: Payer: Self-pay | Admitting: Family Medicine

## 2017-07-19 ENCOUNTER — Telehealth: Payer: Self-pay | Admitting: Oncology

## 2017-07-19 NOTE — Telephone Encounter (Signed)
Spoke with patient new patient appointment with GM 11/14 @ 4 pm.

## 2017-07-24 DIAGNOSIS — H353112 Nonexudative age-related macular degeneration, right eye, intermediate dry stage: Secondary | ICD-10-CM | POA: Diagnosis not present

## 2017-07-24 DIAGNOSIS — H2511 Age-related nuclear cataract, right eye: Secondary | ICD-10-CM | POA: Diagnosis not present

## 2017-07-24 DIAGNOSIS — H353123 Nonexudative age-related macular degeneration, left eye, advanced atrophic without subfoveal involvement: Secondary | ICD-10-CM | POA: Diagnosis not present

## 2017-07-24 DIAGNOSIS — H353222 Exudative age-related macular degeneration, left eye, with inactive choroidal neovascularization: Secondary | ICD-10-CM | POA: Diagnosis not present

## 2017-07-28 NOTE — Progress Notes (Signed)
[facility]  [phone] [phone]     ID: Alexis Wood DOB: November 23, 1955  MR#: [id number]  OBS#:[id number]  Patient Care Team: Midge Minium, MD as PCP - General (Family Medicine) Arvella Nigh, MD as Consulting Physician (Obstetrics and Gynecology) Erroll Luna, MD as Consulting Physician (General Surgery) Magrinat, Virgie Dad, MD as Consulting Physician (Oncology) OTHER MD:  CHIEF COMPLAINT: Atypical ductal hyperplasia  CURRENT TREATMENT: Anastrozole; intensified screening   HISTORY OF CURRENT ILLNESS: Alexis Wood had routine screening mammography June 08, 2015 showing a possible abnormality in the left breast.  On 06/19/2015 she underwent left diagnostic mammography with tomography and left breast ultrasonography, showing the breast density to be category C.  In the left breast upper outer quadrant there was a persistent area of distortion, with a second area approximately 3.5 cm medially to the first.  Accordingly on June 29, 2017 she underwent biopsy of the 2 left breast areas in question, but one in the upper inner quadrant and one in the upper outer quadrant, both showing complex sclerosing lesions.  ([id number]).  The patient then met with Dr. Brantley Stage and after appropriate discussion on September 05, 2015 proceeded to left double lumpectomy.  The pathology from this procedure ([id number]) showed in the more lateral lesion a complex Raulerson lesion with atypical ductal hyperplasia.  The more medial lesion showed a complex sclerosing lesion without atypia.  The patient is now referred for discussion of her risk of developing breast cancer in the future  Her subsequent history is as detailed below.  INTERVAL HISTORY: Alexis Wood was evaluated in the high risk breast cancer clinic 07/31/2017   REVIEW OF SYSTEMS: Alexis Wood is doing well. She denies unusual headaches, visual changes, nausea, vomiting, or dizziness. There has been no unusual  cough, phlegm production, or pleurisy. This been no change in bowel or bladder habits. She denies unexplained fatigue or unexplained weight loss, bleeding, rash, or fever. A detailed review of systems was otherwise entirely stable.    PAST MEDICAL HISTORY: Past Medical History:  Diagnosis Date  . Anxiety   . Breast mass, left   . Carpal tunnel syndrome   . Hyperlipidemia   . Hypertension   . Hypothyroidism   . Macular degeneration   . Osteoarthritis    right thumb    PAST SURGICAL HISTORY: Past Surgical History:  Procedure Laterality Date  . BREAST LUMPECTOMY WITH NEEDLE LOCALIZATION Left 09/05/2015   Procedure: LEFT BREAST WIRE LOCALIZED  LUMPECTOMY TIMES 2;  Surgeon: Erroll Luna, MD;  Location: [facility];  Service: General;  Laterality: Left;  . DILATATION & CURRETTAGE/HYSTEROSCOPY WITH RESECTOCOPE  02/2006   benign polyp  . THYROIDECTOMY  Age 34    FAMILY HISTORY Family History  Problem Relation Age of Onset  . Coronary artery disease Mother   . Hypertension Mother   . Dementia Mother   . Coronary artery disease Father   . Hypertension Father   . Diabetes Father   . Breast cancer Maternal Aunt   . Diabetes type I Unknown   Patient's father passed away in Nov 09, 2013 at age 68 from a massive heart attack. He had a pacemaker. Patient's mother passed away in 05-09-2017 at age 50. She had dementia. The patient only has one sister. Her mother had three sisters and one of them had breast cancer when she was in her 44's.    GYNECOLOGIC HISTORY:  Patient's last menstrual period was 03/09/2004. Menarche at 61 years old. GXP1, First  carried to term at 61 years old. Was on birth control pills for about 14 years with no issues. Menopause in her 51's with no hormone replacement.  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 She lives at home with her husband, Clair Gulling, who is retired with disabilities. Her  only daughter, Caryl Pina, lives in [place], [place] and does research for [employer]. The patient is not a Banker.    ADVANCED DIRECTIVES:    HEALTH MAINTENANCE: Social History   Tobacco Use  . Smoking status: Current Every Day Smoker    Packs/day: 0.50    Types: Cigarettes  . Smokeless tobacco: Never Used  Substance Use Topics  . Alcohol use: No  . Drug use: No     Colonoscopy:  PAP:  Bone density:   Allergies  Allergen Reactions  . Clindamycin/Lincomycin Nausea Only and Other (See Comments)    Diarrhea   . Penicillins Hives  . Pristiq [Desvenlafaxine] Hives    Current Outpatient Medications  Medication Sig Dispense Refill  . ALPRAZolam (XANAX) 1 MG tablet Take 1 tablet (1 mg total) by mouth 2 (two) times daily as needed. for anxiety 60 tablet 0  . buPROPion (WELLBUTRIN XL) 300 MG 24 hr tablet TAKE 1 TABLET(300 MG) BY MOUTH DAILY 30 tablet 6  . Cholecalciferol (VITAMIN D) 2000 units tablet Take 2,000 Units by mouth daily.    Marland Kitchen levothyroxine (SYNTHROID, LEVOTHROID) 112 MCG tablet TAKE 1 TABLET(112 MCG) BY MOUTH DAILY 90 tablet 1  . losartan (COZAAR) 50 MG tablet TAKE 1 TABLET(50 MG) BY MOUTH DAILY 90 tablet 1  . Multiple Vitamins-Minerals (PRESERVISION AREDS 2) CAPS Take by mouth.     No current facility-administered medications for this visit.     OBJECTIVE: Middle-aged white woman who appears well  Vitals:   07/31/17 1531  BP: (!) 159/96  Pulse: 98  Resp: 20  Temp: 98.1 F (36.7 C)  SpO2: 97%     Body mass index is 29.75 kg/m.   Wt Readings from Last 3 Encounters:  07/31/17 173 lb 4.8 oz (78.6 kg)  02/07/17 169 lb 4 oz (76.8 kg)  08/02/16 172 lb (78 kg)      ECOG FS:0 - Asymptomatic  Ocular: Sclerae unicteric, pupils round and equal Ear-nose-throat: Oropharynx clear and moist Lymphatic: No cervical or supraclavicular adenopathy Lungs no rales or rhonchi Heart regular rate and rhythm Abd soft, nontender, positive bowel sounds MSK no focal spinal  tenderness, no joint edema Neuro: non-focal, well-oriented, appropriate affect Breasts: I do not palpate a suspicious mass in either breast.  The left of course is status post lumpectomy x2, with the incisions nicely healed.  There are no skin or nipple changes of concern.  Both axillae are benign.   LAB RESULTS:  CMP     Component Value Date/Time   NA 138 02/07/2017 1542   K 4.4 02/07/2017 1542   CL 99 02/07/2017 1542   CO2 28 02/07/2017 1542   GLUCOSE 88 02/07/2017 1542   GLUCOSE 66 10/12/2009   BUN 12 02/07/2017 1542   CREATININE 0.94 02/07/2017 1542   CALCIUM 9.9 02/07/2017 1542   PROT 7.0 08/02/2016 1419   ALBUMIN 4.6 08/02/2016 1419   AST 19 08/02/2016 1419   ALT 28 08/02/2016 1419   ALKPHOS 79 08/02/2016 1419   BILITOT 0.6 08/02/2016 1419   GFRNONAA 76.20 03/30/2010 0000    No results found for: TOTALPROTELP, ALBUMINELP, A1GS, A2GS, BETS, BETA2SER, GAMS, MSPIKE, SPEI  No results found for: Nils Pyle, [facility]  Lab Results  Component Value Date   WBC 10.8 02/07/2017   NEUTROABS 5,940 02/07/2017   HGB 15.3 02/07/2017   HCT 44.7 02/07/2017   MCV 86.8 02/07/2017   PLT 211 02/07/2017    '@LASTCHEMISTRY' @  No results found for: LABCA2  No components found for: XQJJHE174  No results for input(s): INR in the last 168 hours.  No results found for: LABCA2  No results found for: YCX448  No results found for: JEH631  No results found for: SHF026  No results found for: CA2729  No components found for: HGQUANT  No results found for: CEA1 / No results found for: CEA1   No results found for: AFPTUMOR  No results found for: CHROMOGRNA  No results found for: PSA1  No visits with results within 3 Day(s) from this visit.  Latest known visit with results is:  Office Visit on 02/07/2017  Component Date Value Ref Range Status  . Sodium 02/07/2017 138  135 - 146 mmol/L Final  . Potassium 02/07/2017 4.4  3.5 - 5.3 mmol/L Final  . Chloride  02/07/2017 99  98 - 110 mmol/L Final  . CO2 02/07/2017 28  20 - 31 mmol/L Final  . Glucose, Bld 02/07/2017 88  65 - 99 mg/dL Final  . BUN 02/07/2017 12  7 - 25 mg/dL Final  . Creat 02/07/2017 0.94  0.50 - 0.99 mg/dL Final   Comment:   For patients > or = 61 years of age: The upper reference limit for Creatinine is approximately 13% higher for people identified as African-American.     . Calcium 02/07/2017 9.9  8.6 - 10.4 mg/dL Final  . TSH 02/07/2017 1.13  mIU/L Final   Comment:   Reference Range   > or = 20 Years  0.40-4.50   Pregnancy Range First trimester  0.26-2.66 Second trimester 0.55-2.73 Third trimester  0.43-2.91     . WBC 02/07/2017 10.8  3.8 - 10.8 K/uL Final  . RBC 02/07/2017 5.15* 3.80 - 5.10 MIL/uL Final  . Hemoglobin 02/07/2017 15.3  11.7 - 15.5 g/dL Final  . HCT 02/07/2017 44.7  35.0 - 45.0 % Final  . MCV 02/07/2017 86.8  80.0 - 100.0 fL Final  . MCH 02/07/2017 29.7  27.0 - 33.0 pg Final  . MCHC 02/07/2017 34.2  32.0 - 36.0 g/dL Final  . RDW 02/07/2017 12.7  11.0 - 15.0 % Final  . Platelets 02/07/2017 211  140 - 400 K/uL Final  . MPV 02/07/2017 12.1  7.5 - 12.5 fL Final  . Neutro Abs 02/07/2017 5,940  1,500 - 7,800 cells/uL Final  . Lymphs Abs 02/07/2017 3,888  850 - 3,900 cells/uL Final  . Monocytes Absolute 02/07/2017 648  200 - 950 cells/uL Final  . Eosinophils Absolute 02/07/2017 324  15 - 500 cells/uL Final  . Basophils Absolute 02/07/2017 0  0 - 200 cells/uL Final  . Neutrophils Relative % 02/07/2017 55  % Final  . Lymphocytes Relative 02/07/2017 36  % Final  . Monocytes Relative 02/07/2017 6  % Final  . Eosinophils Relative 02/07/2017 3  % Final  . Basophils Relative 02/07/2017 0  % Final  . Smear Review 02/07/2017 Criteria for review not met   Final  . HCV Ab 02/07/2017 NEGATIVE  NEGATIVE Final    (this displays the last labs from the last 3 days)  No results found for: TOTALPROTELP, ALBUMINELP, A1GS, A2GS, BETS, BETA2SER, GAMS, MSPIKE,  SPEI (this displays SPEP  labs)  No results found for: KPAFRELGTCHN, LAMBDASER, KAPLAMBRATIO (kappa/lambda light chains)  No results found for: HGBA, HGBA2QUANT, HGBFQUANT, HGBSQUAN (Hemoglobinopathy evaluation)   No results found for: LDH  No results found for: IRON, TIBC, IRONPCTSAT (Iron and TIBC)  No results found for: FERRITIN  Urinalysis No results found for: COLORURINE, APPEARANCEUR, LABSPEC, PHURINE, GLUCOSEU, HGBUR, BILIRUBINUR, KETONESUR, PROTEINUR, UROBILINOGEN, NITRITE, LEUKOCYTESUR   STUDIES: DEXA scan 06/10/2016 reportedly normal  ELIGIBLE FOR AVAILABLE RESEARCH PROTOCOL: no  ASSESSMENT: 61 y.o. 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 Alexis Wood understands ADH means, first, relatively unrestricted growth of breast cells and, in addition, some morphologically different features from simple hyperplasia. Atypical ductal hyperplasia can be difficult to tell apart ductal cancer in situ. For those reasons the ADH lesionneeds to be removed.  ADH is also a marker of breast cancer risk. The risk of developing breast cancer in patients with ADH falls somewhere between one half and 1% per year. The new cancer can develop in either breast. One half of those tumors would be invasive.  Working from the patient's age to the average survival of women today in the [place] I think Alexis Wood can expect to live an additional 25-30 years. Using the high end of the risk spectrum for purposes of discussion, this would give her a 20-30% chance of developing breast cancer in her lifetime. This is approximately 4 times the normal risk.  The patient's wrist may also be increased because she waited until after the age of 85 to carry a child to term.  Dr. Renold Don in  fact calculated her risk of developing breast cancer as over 40%  Alexis Wood has essentially two ways of lowering that risk. One of them is bilateral mastectomies. This is not recommended for this indication and I do not believe it would be covered by insurance even if she wanted it which she doesn't. The other, more reasonable approach would be to take anti-estrogens. This could be tamoxifen, raloxifene/Evista, or an aromatase inhibitor  We then discussed the possible toxicities, side effects and complications of the tamoxifen group as opposed to the aromatase inhibitors. All information was given to Alexis Wood in Estate agent. Any of those agents if taken for 5 years would essentially cut the risk of developing a new breast cancer in half.  We also discussed intensified screening. This would be adding yearly MRI to yearly mammography with tomography. This approach greatly increases sensitivity. Any breast cancer that may develop should be found at the earliest possible stage.  The downside of high sensitivity is concerns regarding false positives especially in the first or second year of MRI screening.  Alexis Wood has a good understanding of all these options.  She is very interested in starting antiestrogens and specifically anastrozole.  She is also interested in alternating breast MRI and mammography every 6 months.  Additionally it will be helpful if she has a breast exam by a physician every 6 months  Accordingly she is starting anastrozole now.  She will return to see me in March for her breast exam and to review her breast MRI which will be done late February.  She will then continue to see Dr. Bennye Alm in September as she usually does, with mammography through his office and breast exam under his care  Alexis Wood has a good understanding  of the overall plan. She agrees with it. She knows the goal of treatment in her case is mention. She will call with any problems that may develop before her next visit  here.   Magrinat, Virgie Dad, MD  07/31/17 4:40 PM Medical Oncology and Hematology [facility] [address] [place], [place] [address] Tel. [phone]    Fax. [phone]   This document serves as a record of services personally performed by Chauncey Cruel, MD. It was created on his behalf by Margit Banda, a trained medical scribe. The creation of this record is based on the scribe's personal observations and the provider's statements to them.   I have reviewed the above documentation for accuracy and completeness, and I agree with the above.

## 2017-07-31 ENCOUNTER — Ambulatory Visit (HOSPITAL_BASED_OUTPATIENT_CLINIC_OR_DEPARTMENT_OTHER): Payer: 59 | Admitting: Oncology

## 2017-07-31 DIAGNOSIS — N6092 Unspecified benign mammary dysplasia of left breast: Secondary | ICD-10-CM | POA: Diagnosis not present

## 2017-07-31 MED ORDER — ANASTROZOLE 1 MG PO TABS: 1.0000 mg | ORAL_TABLET | Freq: Every day | ORAL | 4 refills | Status: DC

## 2017-08-02 ENCOUNTER — Telehealth: Payer: Self-pay | Admitting: Oncology

## 2017-08-02 NOTE — Telephone Encounter (Signed)
Scheduled appt per 11/15 sch msg. Left message for patient regarding appt. Sending confirmation letter in the mail.

## 2017-08-04 ENCOUNTER — Telehealth: Payer: Self-pay | Admitting: Family Medicine

## 2017-08-04 NOTE — Telephone Encounter (Signed)
Copied from CRM [id number]. Topic: Quick Communication - See Telephone Encounter >> Aug 04, 2017  8:10 AM Rudi CocoLathan, Carisha Kantor M, NT wrote: CRM for notification. See Telephone encounter for:   08/04/17. Pt. Had canceled appt.for wed. 08/06/17 Due to stomach bug all weekend but hopes she will fell better by Wednesday please give pt. A call to let her know

## 2017-08-04 NOTE — Telephone Encounter (Signed)
Patient has just been battling some nausea.  She has not run a fever, no other issues, so she is going to keep her appointment.

## 2017-08-05 ENCOUNTER — Telehealth: Payer: Self-pay | Admitting: *Deleted

## 2017-08-05 NOTE — Telephone Encounter (Signed)
This RN contacted pt per her VM stating concern due to need to change MD appointment to April 2019 - which is currently scheduled in March.  Alexis Wood stated per call that she is to see GYN then Dr Darnelle CatalanMagrinat approximately 6 months later.  This RN informed patient above request will be sent to scheduling.  This RN asked if she had any other additional concerns with pt stating she has developed " nausea and lost of appetite since I took the pill prescribed by Dr Darnelle CatalanMagrinat "  Per above inquiry - Alexis Wood states she took her first dose of anastrazole at 8 am Saturday, she developed nausea at 10am.  " I haven't taken it since and I do not want to "  Alexis Wood has not had fevers, diarrhea or vomiting. She is able to drink ginger ale for hydration.  This RN inquired what pt has been using for nausea and need for prescription medication- Alexis Wood states she has " chewed a tums every now and then- and today the nausea is starting to ease off "  This RN discussed with pt possible need to see Dr Darnelle CatalanMagrinat prior to scheduled appointment to review other medications for benefit, with goal of therapy to lessen her chance of disease occurring with the least amount of side effects.  Alexis Wood stated " Dr Darnelle CatalanMagrinat mentioned Tamoxifen but I do not want to take it either - it has too many side effects ".  Plan per above is for this note to be reviewed with MD for further recommendations.  Presently patient is scheduled for MD visit in March of 2019 and she will be seeing Dr Arelia SneddonMcComb this week.

## 2017-08-06 ENCOUNTER — Encounter: Payer: Self-pay | Admitting: Family Medicine

## 2017-08-06 ENCOUNTER — Encounter: Payer: Self-pay | Admitting: Oncology

## 2017-08-06 ENCOUNTER — Other Ambulatory Visit: Payer: Self-pay | Admitting: Oncology

## 2017-08-08 ENCOUNTER — Telehealth: Payer: Self-pay | Admitting: Oncology

## 2017-08-08 ENCOUNTER — Telehealth: Payer: Self-pay | Admitting: *Deleted

## 2017-08-08 ENCOUNTER — Other Ambulatory Visit: Payer: Self-pay | Admitting: Physician Assistant

## 2017-08-08 MED ORDER — TAMOXIFEN CITRATE 20 MG PO TABS: 20.0000 mg | ORAL_TABLET | Freq: Every day | ORAL | 3 refills | Status: DC

## 2017-08-08 NOTE — Telephone Encounter (Signed)
This RN spoke with pt per follow up call regarding side effects and plan of care for high risk for breast cancer.  Per phone discussion relayed MD concern and plan to discuss further at next appointment.  Clydie BraunKaren inquired further regarding :  How long she would have to do every 6 month MRI alternating with mammo-  Side effects of tamoxifen -  Risk if she does not take any antiestrogens.  This RN discussed above including " the how long she would do MRI alt Mammo "- needs to be answered by MD.  Per discussion that with no antiestrogen therapy - she is at the risk MD discussed at her visit.  The goal of MRI alt with mammo q 6 months would be to try to detect a cancer at it's earliest development- but not lower or prevent a breast cancer forming.  Per discussion regarding concerns of side effects of tamoxifen - with pt stating concern is for blood clots.  This RN reviewed above including pt verifying she has taken BCP - while smoking with no issues.  Post answering pt's questions - she stated she would like to proceed to the use of tamoxifen and requested a prescription. Pharmacy verified.  Pt understands to call this office if she has further concerns or any side effects from the medication.  Of note this RN also inquired and addressed the pt's concern with a prior call with a scheduling staff that she felt was not beneficial.

## 2017-08-08 NOTE — Telephone Encounter (Signed)
No 11/15 los at check out.  °

## 2017-08-08 NOTE — Telephone Encounter (Signed)
This RN called pt per concern noted in email to follow up per conversation on 11/20. Obtained number identified VM- message left stating concern noted and awaiting MD review for further recommendation.  This RN's name and return office number given.

## 2017-08-11 ENCOUNTER — Other Ambulatory Visit: Payer: Self-pay | Admitting: *Deleted

## 2017-08-11 ENCOUNTER — Other Ambulatory Visit: Payer: Self-pay | Admitting: Oncology

## 2017-08-11 ENCOUNTER — Encounter: Payer: Self-pay | Admitting: Oncology

## 2017-08-11 MED ORDER — ANASTROZOLE 1 MG PO TABS: 1.0000 mg | ORAL_TABLET | Freq: Every day | ORAL | 4 refills | Status: DC

## 2017-08-11 MED ORDER — PROMETHAZINE HCL 25 MG PO TABS: ORAL_TABLET | ORAL | 0 refills | Status: DC

## 2017-08-11 NOTE — Progress Notes (Unsigned)
Alexis Wood tells me that she took 1 dose of anastrozole and several hours later started being nauseated and was nauseated for 5 days.  I really do not think this is going to be the anastrozole or to put it another way I have not had any other patient tell me of a similar problem for the past decade and a half.  She could have had a virus or it could have been something else she ate although I suppose it is possible that it causes particular problem in her, but it would be very unusual.  She does not want to try tamoxifen because she is on Wellbutrin and she is aware of the possible interaction between those drugs  She would like to give anastrozole another try.  She would like to try some Phenergan before taking it and I think that is reasonable at least the first time.  If she does not have any nausea the first time she probably should try the second time without the Phenergan.  In any case I am calling both drugs in for her and I asked her to give us a call later this week and let me know how its going.

## 2017-08-11 NOTE — Telephone Encounter (Signed)
Will defer further refills of patient's medications to PCP  

## 2017-08-12 ENCOUNTER — Other Ambulatory Visit: Payer: Self-pay

## 2017-08-12 MED ORDER — PROMETHAZINE HCL 25 MG PO TABS: 25.0000 mg | ORAL_TABLET | Freq: Three times a day (TID) | ORAL | 0 refills | Status: DC | PRN

## 2017-08-18 ENCOUNTER — Other Ambulatory Visit: Payer: Self-pay | Admitting: *Deleted

## 2017-08-18 DIAGNOSIS — Z1231 Encounter for screening mammogram for malignant neoplasm of breast: Secondary | ICD-10-CM

## 2017-08-22 ENCOUNTER — Encounter: Payer: Self-pay | Admitting: Family Medicine

## 2017-08-28 ENCOUNTER — Encounter: Payer: Self-pay | Admitting: Oncology

## 2017-09-04 ENCOUNTER — Other Ambulatory Visit: Payer: Self-pay | Admitting: Family Medicine

## 2017-09-04 ENCOUNTER — Encounter: Payer: Self-pay | Admitting: Oncology

## 2017-09-18 ENCOUNTER — Encounter: Payer: Self-pay | Admitting: Oncology

## 2017-09-24 ENCOUNTER — Other Ambulatory Visit: Payer: Self-pay | Admitting: Oncology

## 2017-09-24 ENCOUNTER — Telehealth: Payer: Self-pay

## 2017-09-24 MED ORDER — LORAZEPAM 0.5 MG PO TABS: ORAL_TABLET | ORAL | 0 refills | Status: DC

## 2017-09-24 NOTE — Telephone Encounter (Signed)
Prescription for lorazepam called in to pt's pharmacy. Spoke to pt by phone and make her aware of prescription for lorazepam to take prior to MRI.

## 2017-10-02 ENCOUNTER — Encounter: Payer: Self-pay | Admitting: Family Medicine

## 2017-10-06 ENCOUNTER — Encounter: Payer: Self-pay | Admitting: General Practice

## 2017-10-06 ENCOUNTER — Ambulatory Visit (INDEPENDENT_AMBULATORY_CARE_PROVIDER_SITE_OTHER): Payer: 59 | Admitting: Family Medicine

## 2017-10-06 ENCOUNTER — Other Ambulatory Visit: Payer: Self-pay

## 2017-10-06 ENCOUNTER — Encounter: Payer: Self-pay | Admitting: Family Medicine

## 2017-10-06 VITALS — BP 133/90 | HR 93 | Temp 98.1°F | Resp 16 | Ht 64.0 in | Wt 167.5 lb

## 2017-10-06 DIAGNOSIS — M7660 Achilles tendinitis, unspecified leg: Secondary | ICD-10-CM | POA: Diagnosis not present

## 2017-10-06 DIAGNOSIS — E559 Vitamin D deficiency, unspecified: Secondary | ICD-10-CM | POA: Diagnosis not present

## 2017-10-06 DIAGNOSIS — Z0000 Encounter for general adult medical examination without abnormal findings: Secondary | ICD-10-CM

## 2017-10-06 DIAGNOSIS — Z1211 Encounter for screening for malignant neoplasm of colon: Secondary | ICD-10-CM | POA: Diagnosis not present

## 2017-10-06 DIAGNOSIS — I10 Essential (primary) hypertension: Secondary | ICD-10-CM | POA: Diagnosis not present

## 2017-10-06 LAB — CBC WITH DIFFERENTIAL/PLATELET
BASOS PCT: 0.5 % (ref 0.0–3.0)
Basophils Absolute: 0.1 10*3/uL (ref 0.0–0.1)
EOS ABS: 0.3 10*3/uL (ref 0.0–0.7)
EOS PCT: 3.5 % (ref 0.0–5.0)
HCT: 47.1 % — ABNORMAL HIGH (ref 36.0–46.0)
HEMOGLOBIN: 15.9 g/dL — AB (ref 12.0–15.0)
LYMPHS PCT: 36.1 % (ref 12.0–46.0)
Lymphs Abs: 3.5 10*3/uL (ref 0.7–4.0)
MCHC: 33.8 g/dL (ref 30.0–36.0)
MCV: 88.5 fl (ref 78.0–100.0)
MONO ABS: 0.5 10*3/uL (ref 0.1–1.0)
Monocytes Relative: 4.8 % (ref 3.0–12.0)
Neutro Abs: 5.4 10*3/uL (ref 1.4–7.7)
Neutrophils Relative %: 55.1 % (ref 43.0–77.0)
Platelets: 203 10*3/uL (ref 150.0–400.0)
RBC: 5.32 Mil/uL — AB (ref 3.87–5.11)
RDW: 12.6 % (ref 11.5–15.5)
WBC: 9.8 10*3/uL (ref 4.0–10.5)

## 2017-10-06 LAB — HEPATIC FUNCTION PANEL
ALBUMIN: 4.5 g/dL (ref 3.5–5.2)
ALT: 28 U/L (ref 0–35)
AST: 18 U/L (ref 0–37)
Alkaline Phosphatase: 74 U/L (ref 39–117)
BILIRUBIN TOTAL: 0.5 mg/dL (ref 0.2–1.2)
Bilirubin, Direct: 0.1 mg/dL (ref 0.0–0.3)
Total Protein: 6.8 g/dL (ref 6.0–8.3)

## 2017-10-06 LAB — TSH: TSH: 0.74 u[IU]/mL (ref 0.35–4.50)

## 2017-10-06 LAB — LIPID PANEL
Cholesterol: 159 mg/dL (ref 0–200)
HDL: 66.1 mg/dL (ref >39.00)
LDL Cholesterol: 64 mg/dL (ref 0–99)
NonHDL: 93.39
TRIGLYCERIDES: 145 mg/dL (ref 0.0–149.0)
Total CHOL/HDL Ratio: 2
VLDL: 29 mg/dL (ref 0.0–40.0)

## 2017-10-06 LAB — BASIC METABOLIC PANEL
BUN: 13 mg/dL (ref 6–23)
CHLORIDE: 102 meq/L (ref 96–112)
CO2: 29 mEq/L (ref 19–32)
Calcium: 9.5 mg/dL (ref 8.4–10.5)
Creatinine, Ser: 0.86 mg/dL (ref 0.40–1.20)
GFR: 71.22 mL/min (ref >60.00)
GLUCOSE: 102 mg/dL — AB (ref 70–99)
POTASSIUM: 4.4 meq/L (ref 3.5–5.1)
SODIUM: 140 meq/L (ref 135–145)

## 2017-10-06 LAB — VITAMIN D 25 HYDROXY (VIT D DEFICIENCY, FRACTURES): VITD: 46.55 ng/mL (ref 30.00–100.00)

## 2017-10-06 NOTE — Assessment & Plan Note (Signed)
Pt's PE WNL.  UTD on pap and mammo.  Due for repeat colonoscopy- referral placed.  Refused Tdap today.  Check labs.  Anticipatory guidance provided.

## 2017-10-06 NOTE — Assessment & Plan Note (Signed)
Chronic problem.  Adequate control today.  Asymptomatic.  Check labs.  No anticipated med changes.  Will follow. 

## 2017-10-06 NOTE — Progress Notes (Signed)
   Subjective:    Patient ID: Alexis ConradiKaren F Hausman, female    DOB: [birth year], 62 y.o.   MRN: [id number]  HPI CPE- UTD on mammo, pap, colonoscopy due this year Deboraha Sprang([facility]).  UTD on flu.  Declines Tdap.   Review of Systems Patient reports no vision/ hearing changes, adenopathy,fever, weight change,  persistant/recurrent hoarseness , swallowing issues, chest pain, palpitations, edema, persistant/recurrent cough, hemoptysis, dyspnea (rest/exertional/paroxysmal nocturnal), gastrointestinal bleeding (melena, rectal bleeding), abdominal pain, significant heartburn, bowel changes, GU symptoms (dysuria, hematuria, incontinence), Gyn symptoms (abnormal  bleeding, pain),  syncope, focal weakness, memory loss, numbness & tingling, skin/hair/nail changes, abnormal bruising or bleeding, anxiety, or depression.   L achilles pain- pt finds that the pain causes her to limp.  Palpable lump on back of heel.    Objective:   Physical Exam General Appearance:    Alert, cooperative, no distress, appears stated age  Head:    Normocephalic, without obvious abnormality, atraumatic  Eyes:    PERRL, conjunctiva/corneas clear, EOM's intact, fundi    benign, both eyes  Ears:    Normal TM's and external ear canals, both ears  Nose:   Nares normal, septum midline, mucosa normal, no drainage    or sinus tenderness  Throat:   Lips, mucosa, and tongue normal; teeth and gums normal  Neck:   Supple, symmetrical, trachea midline, no adenopathy;    Thyroid: no enlargement/tenderness/nodules  Back:     Symmetric, no curvature, ROM normal, no CVA tenderness  Lungs:     Clear to auscultation bilaterally, respirations unlabored  Chest Wall:    No tenderness or deformity   Heart:    Regular rate and rhythm, S1 and S2 normal, no murmur, rub   or gallop  Breast Exam:    Deferred to GYN  Abdomen:     Soft, non-tender, bowel sounds active all four quadrants,    no masses, no organomegaly  Genitalia:    Deferred to GYN  Rectal:      Extremities:   Extremities normal, atraumatic, no cyanosis or edema.  TTP over L achilles  Pulses:   2+ and symmetric all extremities  Skin:   Skin color, texture, turgor normal, no rashes or lesions  Lymph nodes:   Cervical, supraclavicular, and axillary nodes normal  Neurologic:   CNII-XII intact, normal strength, sensation and reflexes    throughout          Assessment & Plan:

## 2017-10-06 NOTE — Assessment & Plan Note (Signed)
Pt has hx of this.  Check labs and replete prn. 

## 2017-10-06 NOTE — Patient Instructions (Signed)
Follow up in 6 months to recheck BP and thyroid We'll notify you of your lab results and make any changes if needed Continue to work on healthy diet and regular exercise- you can do it! Continue to decrease your smoking!  You can quit!! We'll call you with your Sports Med appt We'll call you with your GI appt Call with any questions or concerns Happy New Year!!

## 2017-10-09 ENCOUNTER — Other Ambulatory Visit: Payer: Self-pay | Admitting: Family Medicine

## 2017-10-10 NOTE — Telephone Encounter (Signed)
Last OV 02/07/17 Alprazolam last filled 08/11/17 #60 with 0

## 2017-10-10 NOTE — Telephone Encounter (Signed)
Medication filled to pharmacy as requested.   

## 2017-10-20 ENCOUNTER — Encounter: Payer: Self-pay | Admitting: Family Medicine

## 2017-10-20 ENCOUNTER — Ambulatory Visit: Payer: 59 | Admitting: Family Medicine

## 2017-10-20 DIAGNOSIS — M7662 Achilles tendinitis, left leg: Secondary | ICD-10-CM | POA: Diagnosis not present

## 2017-10-20 NOTE — Progress Notes (Signed)
PCP and consultation requested by: Sheliah Hatch, MD  Subjective:   HPI: Patient is a 62 y.o. female here for left achilles pain.  Patient reports for a couple months she's had posterior left heel pain. No acute injury or trauma. Pain was sharp, worse with compression but now having no pain. Associated swelling locally. Feels more stiff in the mornings. No prior issues. No skin changes, numbness.  Past Medical History:  Diagnosis Date  . Anxiety   . Breast mass, left   . Carpal tunnel syndrome   . Hyperlipidemia   . Hypertension   . Hypothyroidism   . Macular degeneration   . Osteoarthritis    right thumb    Current Outpatient Medications on File Prior to Visit  Medication Sig Dispense Refill  . ALPRAZolam (XANAX) 1 MG tablet TAKE 1 TABLET BY MOUTH TWICE DAILY AS NEEDED FOR ANXIETY 60 tablet 0  . anastrozole (ARIMIDEX) 1 MG tablet Take 1 tablet (1 mg total) by mouth daily. 90 tablet 4  . buPROPion (WELLBUTRIN XL) 300 MG 24 hr tablet TAKE 1 TABLET(300 MG) BY MOUTH DAILY 30 tablet 6  . Cholecalciferol (VITAMIN D) 2000 units tablet Take 2,000 Units by mouth daily.    Marland Kitchen levothyroxine (SYNTHROID, LEVOTHROID) 112 MCG tablet TAKE 1 TABLET(112 MCG) BY MOUTH DAILY 90 tablet 0  . LORazepam (ATIVAN) 0.5 MG tablet Take opne shortly before MRI; may repeat x1 (Patient not taking: Reported on 10/06/2017) 6 tablet 0  . losartan (COZAAR) 50 MG tablet TAKE 1 TABLET(50 MG) BY MOUTH DAILY 90 tablet 0  . Multiple Vitamins-Minerals (PRESERVISION AREDS 2) CAPS Take by mouth.    . Vitamin D, Ergocalciferol, 2000 units CAPS Take by mouth.     No current facility-administered medications on file prior to visit.     Past Surgical History:  Procedure Laterality Date  . BREAST LUMPECTOMY WITH NEEDLE LOCALIZATION Left 09/05/2015   Procedure: LEFT BREAST WIRE LOCALIZED  LUMPECTOMY TIMES 2;  Surgeon: Harriette Bouillon, MD;  Location: [facility];  Service: General;  Laterality: Left;   . DILATATION & CURRETTAGE/HYSTEROSCOPY WITH RESECTOCOPE  02/2006   benign polyp  . THYROIDECTOMY  Age 22    Allergies  Allergen Reactions  . Clindamycin/Lincomycin Nausea Only and Other (See Comments)    Diarrhea   . Penicillins Hives  . Pristiq [Desvenlafaxine] Hives    Social History   Socioeconomic History  . Marital status: Married    Spouse name: Not on file  . Number of children: Not on file  . Years of education: Not on file  . Highest education level: Not on file  Social Needs  . Financial resource strain: Not on file  . Food insecurity - worry: Not on file  . Food insecurity - inability: Not on file  . Transportation needs - medical: Not on file  . Transportation needs - non-medical: Not on file  Occupational History  . Not on file  Tobacco Use  . Smoking status: Current Every Day Smoker    Packs/day: 0.50    Types: Cigarettes  . Smokeless tobacco: Never Used  Substance and Sexual Activity  . Alcohol use: No  . Drug use: No  . Sexual activity: Not Currently    Partners: Male    Birth control/protection: Post-menopausal  Other Topics Concern  . Not on file  Social History Narrative  . Not on file    Family History  Problem Relation Age of Onset  . Coronary artery disease Mother   .  Hypertension Mother   . Dementia Mother   . Coronary artery disease Father   . Hypertension Father   . Diabetes Father   . Breast cancer Maternal Aunt   . Diabetes type I Unknown     BP 130/89   Pulse 94   Ht 5\' 3"  (1.6 m)   Wt 167 lb (75.8 kg)   LMP 03/09/2004   BMI 29.58 kg/m   Review of Systems: See HPI above.     Objective:  Physical Exam:  Gen: NAD, comfortable in exam room  Left foot/ankle: Small haglunds.  No other gross deformity, swelling, ecchymoses FROM with 5/5 strength all directions.   TTP minimally at insertion of achilles on calcaneus. Negative ant drawer and talar tilt.   Negative syndesmotic compression. Thompsons test negative. NV  intact distally.  Right foot/ankle: No deformity. FROM with 5/5 strength. No tenderness to palpation. NVI distally.  MSK u/s:  No thickening of achilles or partial tears noted.  Calcifications noted at insertion on calcaneus.  Assessment & Plan:  1. 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  Clinically much better over past few days.  Shown home exercises to do daily.  Encouraged heel lifts, avoiding flat shoes and barefoot walking.  Icing, tylenol if needed.  Consider PT, orthotics, nitro patches if not continuing to improve.  F/u prn if doing well.

## 2017-10-20 NOTE — Assessment & Plan Note (Signed)
insertional with small haglund's deformity.  Clinically much better over past few days.  Shown home exercises to do daily.  Encouraged heel lifts, avoiding flat shoes and barefoot walking.  Icing, tylenol if needed.  Consider PT, orthotics, nitro patches if not continuing to improve.  F/u prn if doing well.

## 2017-10-20 NOTE — Patient Instructions (Signed)
You have Achilles Tendinopathy Calf raises 3 sets of 10 on level ground once a day first. When these are easy, can do them one legged 3 sets of 10. Consider doing them on a step after this but typically this is not necessary. Can add heel walks, toe walks forward and backward as well Icing if needed 15 minutes at a time 3-4 times a day. Avoid uneven ground, hills as much as possible. Consider heel lifts in shoes or shoes with a natural heel lift. Consider physical therapy, orthotics, nitro patches if not improving as expected. Call me if you have any problems/questions otherwise follow up as needed.

## 2017-10-31 ENCOUNTER — Other Ambulatory Visit: Payer: Self-pay | Admitting: Oncology

## 2017-11-18 DIAGNOSIS — H353123 Nonexudative age-related macular degeneration, left eye, advanced atrophic without subfoveal involvement: Secondary | ICD-10-CM | POA: Diagnosis not present

## 2017-11-18 DIAGNOSIS — H2511 Age-related nuclear cataract, right eye: Secondary | ICD-10-CM | POA: Diagnosis not present

## 2017-11-18 DIAGNOSIS — H353222 Exudative age-related macular degeneration, left eye, with inactive choroidal neovascularization: Secondary | ICD-10-CM | POA: Diagnosis not present

## 2017-11-23 ENCOUNTER — Other Ambulatory Visit: Payer: Self-pay | Admitting: Family Medicine

## 2017-11-24 NOTE — Telephone Encounter (Signed)
Last OV 10/06/17 Alprazolam last filled 10/10/17 #60 with 0

## 2017-12-01 ENCOUNTER — Ambulatory Visit: Payer: Self-pay | Admitting: Oncology

## 2017-12-24 ENCOUNTER — Encounter: Payer: Self-pay | Admitting: Family Medicine

## 2017-12-26 ENCOUNTER — Other Ambulatory Visit: Payer: Self-pay

## 2017-12-26 ENCOUNTER — Ambulatory Visit (HOSPITAL_COMMUNITY): Payer: 59

## 2017-12-26 ENCOUNTER — Ambulatory Visit (HOSPITAL_COMMUNITY)
Admission: RE | Admit: 2017-12-26 | Discharge: 2017-12-26 | Disposition: A | Discharge status: Home / Self Care | Payer: 59 | Source: Ambulatory Visit | Attending: Oncology | Admitting: Oncology

## 2017-12-26 DIAGNOSIS — Z1231 Encounter for screening mammogram for malignant neoplasm of breast: Secondary | ICD-10-CM | POA: Insufficient documentation

## 2017-12-26 DIAGNOSIS — N6092 Unspecified benign mammary dysplasia of left breast: Secondary | ICD-10-CM | POA: Insufficient documentation

## 2017-12-26 DIAGNOSIS — N6489 Other specified disorders of breast: Secondary | ICD-10-CM | POA: Diagnosis not present

## 2017-12-26 MED ORDER — GADOBENATE DIMEGLUMINE 529 MG/ML IV SOLN
15.0000 mL | Freq: Once | INTRAVENOUS | Status: AC | PRN
  Administered 2017-12-26: 15 mL via INTRAVENOUS

## 2017-12-29 ENCOUNTER — Telehealth: Payer: Self-pay | Admitting: *Deleted

## 2017-12-29 ENCOUNTER — Other Ambulatory Visit: Payer: Self-pay | Admitting: Oncology

## 2017-12-29 ENCOUNTER — Other Ambulatory Visit: Payer: Self-pay | Admitting: *Deleted

## 2017-12-29 DIAGNOSIS — N6310 Unspecified lump in the right breast, unspecified quadrant: Secondary | ICD-10-CM

## 2017-12-29 DIAGNOSIS — N6092 Unspecified benign mammary dysplasia of left breast: Secondary | ICD-10-CM

## 2017-12-29 NOTE — Telephone Encounter (Signed)
This RN returned call to the patient per her VM - Alexis Wood states she spoke with Dr Darnelle CatalanMagrinat about abnormal MRI and need for biopsy. Alexis Wood was not sure who would be doing this - " does Dr Darnelle CatalanMagrinat do that in his office ? "  This RN informed [facility] - noted order entered with pt asking " how long will I be out of work - I had this done before and I can't remember ?"  This RN informed pt above is an outpt procedure.  Alexis Wood also requested she would like to have the bx scheduled on a Friday so she would not have to take but one day off work.  This RN contacted central scheduling and was directed to the VM for [name]- above information left on confidential VM.

## 2017-12-29 NOTE — Progress Notes (Unsigned)
I called Alexis Wood with the results of her MRI which do show a spiculated mass in the right breast.  I am setting her up for MRI biopsy.  She is already scheduled to see me on 01/07/2018.

## 2017-12-31 ENCOUNTER — Telehealth: Payer: Self-pay | Admitting: *Deleted

## 2017-12-31 ENCOUNTER — Other Ambulatory Visit: Payer: Self-pay | Admitting: *Deleted

## 2017-12-31 NOTE — Telephone Encounter (Signed)
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  Note above concerned Adisa " did they put the right reading on the right patient ?"  This RN validated her concerns as well as when she goes for MRI with bx the radiologist have to see exactly what they saw on the diagnostic MRI to proceed.  Reading reviewed  with Laiyah stating " so do I still need a biopsy "  This RN informed her " yes " - again reviewed her known history of hyperplasia and no way to verify what the abnormal is except by biopsy.  This RN reiterated MD's prior recommendation for her diagnosis and high risk issues.  Note that biopsy has not been scheduled at this time- this RN will follow up tomorrow and contact patient.

## 2018-01-01 ENCOUNTER — Telehealth: Payer: Self-pay | Admitting: Medical Oncology

## 2018-01-01 ENCOUNTER — Other Ambulatory Visit: Payer: Self-pay | Admitting: Oncology

## 2018-01-01 ENCOUNTER — Other Ambulatory Visit: Payer: Self-pay | Admitting: *Deleted

## 2018-01-01 DIAGNOSIS — N6092 Unspecified benign mammary dysplasia of left breast: Secondary | ICD-10-CM

## 2018-01-01 DIAGNOSIS — N6310 Unspecified lump in the right breast, unspecified quadrant: Secondary | ICD-10-CM

## 2018-01-01 NOTE — Telephone Encounter (Signed)
asking if she can have procedure today. Message forwarded to Val.

## 2018-01-03 ENCOUNTER — Other Ambulatory Visit: Payer: Self-pay | Admitting: Family Medicine

## 2018-01-05 ENCOUNTER — Encounter: Payer: Self-pay | Admitting: Family Medicine

## 2018-01-05 ENCOUNTER — Other Ambulatory Visit: Payer: Self-pay | Admitting: Family Medicine

## 2018-01-05 NOTE — Telephone Encounter (Signed)
Last Filled: 11/24/17  #60, 0 Last OV: 10/06/17

## 2018-01-05 NOTE — Telephone Encounter (Signed)
Last OV 10/06/2017 Fill Date 11/24/2017

## 2018-01-05 NOTE — Telephone Encounter (Signed)
Reviewed chart; chronic medication by chart review. Refilled in PCP's absence.

## 2018-01-07 ENCOUNTER — Ambulatory Visit: Payer: Self-pay | Admitting: Oncology

## 2018-01-08 ENCOUNTER — Other Ambulatory Visit: Payer: Self-pay | Admitting: Family Medicine

## 2018-01-09 ENCOUNTER — Telehealth: Payer: Self-pay

## 2018-01-09 ENCOUNTER — Ambulatory Visit
Admission: RE | Admit: 2018-01-09 | Discharge: 2018-01-09 | Disposition: A | Discharge status: Home / Self Care | Payer: 59 | Source: Ambulatory Visit | Attending: Oncology | Admitting: Oncology

## 2018-01-09 ENCOUNTER — Inpatient Hospital Stay: Payer: 59 | Admitting: Adult Health

## 2018-01-09 DIAGNOSIS — N6092 Unspecified benign mammary dysplasia of left breast: Secondary | ICD-10-CM

## 2018-01-09 DIAGNOSIS — N6310 Unspecified lump in the right breast, unspecified quadrant: Secondary | ICD-10-CM

## 2018-01-09 DIAGNOSIS — N6489 Other specified disorders of breast: Secondary | ICD-10-CM | POA: Diagnosis not present

## 2018-01-09 DIAGNOSIS — R928 Other abnormal and inconclusive findings on diagnostic imaging of breast: Secondary | ICD-10-CM | POA: Diagnosis not present

## 2018-01-09 MED ORDER — GADOBENATE DIMEGLUMINE 529 MG/ML IV SOLN
15.0000 mL | Freq: Once | INTRAVENOUS | Status: AC | PRN
  Administered 2018-01-09: 15 mL via INTRAVENOUS

## 2018-01-09 NOTE — Telephone Encounter (Signed)
Pt left VM regarding her biopsy done today - the results will not be back by 3 pm for her appt with Mardella LaymanLindsey NP today at 3pm.  Reviewed notes and called pt and was able to reschedule her for Monday at 2:30 pm to review results with Mardella LaymanLindsey NP - pt voiced understanding and no other needs per pt at this time.

## 2018-01-12 ENCOUNTER — Telehealth: Payer: Self-pay | Admitting: Adult Health

## 2018-01-12 ENCOUNTER — Inpatient Hospital Stay: Payer: 59 | Attending: Adult Health | Admitting: Adult Health

## 2018-01-12 VITALS — BP 169/86 | HR 93 | Temp 97.9°F | Resp 18 | Ht 63.0 in | Wt 176.5 lb

## 2018-01-12 DIAGNOSIS — I10 Essential (primary) hypertension: Secondary | ICD-10-CM | POA: Diagnosis not present

## 2018-01-12 DIAGNOSIS — Z79811 Long term (current) use of aromatase inhibitors: Secondary | ICD-10-CM

## 2018-01-12 DIAGNOSIS — N6092 Unspecified benign mammary dysplasia of left breast: Secondary | ICD-10-CM | POA: Diagnosis not present

## 2018-01-12 DIAGNOSIS — E039 Hypothyroidism, unspecified: Secondary | ICD-10-CM | POA: Diagnosis not present

## 2018-01-12 NOTE — Telephone Encounter (Signed)
No 4/29 los created/completed °

## 2018-01-12 NOTE — Progress Notes (Addendum)
[place]  [phone] [phone]     ID: Alexis Wood DOB: 07/10/1956  MR#: [id number]  XVQ#:[id number]  Patient Care Team: Midge Minium, MD as PCP - General (Family Medicine) Arvella Nigh, MD as Consulting Physician (Obstetrics and Gynecology) Erroll Luna, MD as Consulting Physician (General Surgery) Magrinat, Virgie Dad, MD as Consulting Physician (Oncology) OTHER MD:  CHIEF COMPLAINT: Atypical ductal hyperplasia  CURRENT TREATMENT: Anastrozole; intensified screening   HISTORY OF CURRENT ILLNESS: Alexis Wood had routine screening mammography June 08, 2015 showing a possible abnormality in the left breast.  On 06/19/2015 she underwent left diagnostic mammography with tomography and left breast ultrasonography, showing the breast density to be category C.  In the left breast upper outer quadrant there was a persistent area of distortion, with a second area approximately 3.5 cm medially to the first.  Accordingly on June 29, 2017 she underwent biopsy of the 2 left breast areas in question, but one in the upper inner quadrant and one in the upper outer quadrant, both showing complex sclerosing lesions.  ([id number]).  The patient then met with Dr. Brantley Stage and after appropriate discussion on September 05, 2015 proceeded to left double lumpectomy.  The pathology from this procedure ([id number]) showed in the more lateral lesion a complex Raul[place] lesion with atypical ductal hyperplasia.  The more medial lesion showed a complex sclerosing lesion without atypia.  The patient is now referred for discussion of her risk of developing breast cancer in the future  Her subsequent history is as detailed below.  INTERVAL HISTORY: Alexis Wood is a high risk breast clinic patient here for routine follow up today and to discuss her biopsy results from 01/09/2018 and they are consistent with a complex sclerosing lesion.  She has an upcoming surgical  consultation with Dr. Brantley Stage.  She wants to know if she can wait to have her surgery.    Alexis Wood continues on Anastrozole daily with good tolerance.  She was started on this in November of 2018.  She is not having any issues with taking it that she notices.     REVIEW OF SYSTEMS: Alexis Wood is doing well today.  She denies any issues with fevers, chills, or chest pains.  She denies shortness of breath, palpitations, cough.  She denies any new breast changes.  She denies abdominal concerns such as constipation, diarrhea, or urinary concerns.  A detailed ROS was conducted today and was non contributory.     PAST MEDICAL HISTORY: Past Medical History:  Diagnosis Date  . Anxiety   . Breast mass, left   . Carpal tunnel syndrome   . Hyperlipidemia   . Hypertension   . Hypothyroidism   . Macular degeneration   . Osteoarthritis    right thumb    PAST SURGICAL HISTORY: Past Surgical History:  Procedure Laterality Date  . BREAST LUMPECTOMY WITH NEEDLE LOCALIZATION Left 09/05/2015   Procedure: LEFT BREAST WIRE LOCALIZED  LUMPECTOMY TIMES 2;  Surgeon: Erroll Luna, MD;  Location: [facility];  Service: General;  Laterality: Left;  . DILATATION & CURRETTAGE/HYSTEROSCOPY WITH RESECTOCOPE  02/2006   benign polyp  . THYROIDECTOMY  Age 9    FAMILY HISTORY Family History  Problem Relation Age of Onset  . Coronary artery disease Mother   . Hypertension Mother   . Dementia Mother   . Coronary artery disease Father   . Hypertension Father   . Diabetes Father   . Breast cancer Maternal Aunt   . Diabetes  type I Unknown   Patient's father passed away in 2013/11/16 at age 16 from a massive heart attack. He had a pacemaker. Patient's mother passed away in 05-16-17 at age 82. She had dementia. The patient only has one sister. Her mother had three sisters and one of them had breast cancer when she was in her 47's.    GYNECOLOGIC HISTORY:  Patient's last menstrual period was  03/09/2004. Menarche at 62 years old. GXP1, First carried to term at 62 years old. Was on birth control pills for about 14 years with no issues. Menopause in her 76's with no hormone replacement.  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 She lives at home with her husband, Clair Gulling, who is retired with disabilities. Her only daughter, Caryl Pina, lives in [place], [place] and does research for [employer]. The patient is not a Banker.    ADVANCED DIRECTIVES:    HEALTH MAINTENANCE: Social History   Tobacco Use  . Smoking status: Current Every Day Smoker    Packs/day: 0.50    Types: Cigarettes  . Smokeless tobacco: Never Used  Substance Use Topics  . Alcohol use: No  . Drug use: No     Colonoscopy:  PAP:  Bone density:   Allergies  Allergen Reactions  . Clindamycin/Lincomycin Nausea Only and Other (See Comments)    Diarrhea   . Penicillins Hives  . Pristiq [Desvenlafaxine] Hives    Current Outpatient Medications  Medication Sig Dispense Refill  . ALPRAZolam (XANAX) 1 MG tablet TAKE 1 TABLET BY MOUTH TWICE DAILY AS NEEDED FOR ANXIETY 60 tablet 0  . anastrozole (ARIMIDEX) 1 MG tablet Take 1 tablet (1 mg total) by mouth daily. 90 tablet 4  . buPROPion (WELLBUTRIN XL) 300 MG 24 hr tablet TAKE 1 TABLET(300 MG) BY MOUTH DAILY 30 tablet 6  . Cholecalciferol (VITAMIN D) 2000 units tablet Take 2,000 Units by mouth daily.    Marland Kitchen levothyroxine (SYNTHROID, LEVOTHROID) 112 MCG tablet TAKE 1 TABLET(112 MCG) BY MOUTH DAILY 90 tablet 0  . LORazepam (ATIVAN) 0.5 MG tablet Take opne shortly before MRI; may repeat x1 6 tablet 0  . losartan (COZAAR) 50 MG tablet TAKE 1 TABLET(50 MG) BY MOUTH DAILY 90 tablet 0  . Multiple Vitamins-Minerals (PRESERVISION AREDS 2) CAPS Take by mouth.    . Vitamin D, Ergocalciferol, 2000 units CAPS Take by mouth.     No current facility-administered medications for this visit.     OBJECTIVE:   Vitals:    01/12/18 1438  BP: (!) 169/86  Pulse: 93  Resp: 18  Temp: 97.9 F (36.6 C)  SpO2: 98%     Body mass index is 31.27 kg/m.   Wt Readings from Last 3 Encounters:  01/12/18 176 lb 8 oz (80.1 kg)  11/16/2017 167 lb (75.8 kg)  10/06/17 167 lb 8 oz (76 kg)      ECOG FS:0 - Asymptomatic GENERAL: Patient is a well appearing female in no acute distress HEENT:  Sclerae anicteric.  Oropharynx clear and moist. No ulcerations or evidence of oropharyngeal candidiasis. Neck is supple.  NODES:  No cervical, supraclavicular, or axillary lymphadenopathy palpated.  BREAST EXAM:  Right breast s/p biopsy, covered in bandage, did not examine breast due to recent biopsy, left breast without nodules, masses, skin or nipple changes LUNGS:  Clear to auscultation bilaterally.  No wheezes or rhonchi. HEART:  Regular rate and rhythm. No murmur appreciated. ABDOMEN:  Soft, nontender.  Positive,  normoactive bowel sounds. No organomegaly palpated. MSK:  No focal spinal tenderness to palpation. Full range of motion bilaterally in the upper extremities. EXTREMITIES:  No peripheral edema.   SKIN:  Clear with no obvious rashes or skin changes. No nail dyscrasia. NEURO:  Nonfocal. Well oriented.  Appropriate affect.    LAB RESULTS:  CMP     Component Value Date/Time   NA 140 10/06/2017 0948   K 4.4 10/06/2017 0948   CL 102 10/06/2017 0948   CO2 29 10/06/2017 0948   GLUCOSE 102 (H) 10/06/2017 0948   GLUCOSE 66 10/12/2009   BUN 13 10/06/2017 0948   CREATININE 0.86 10/06/2017 0948   CREATININE 0.94 02/07/2017 1542   CALCIUM 9.5 10/06/2017 0948   PROT 6.8 10/06/2017 0948   ALBUMIN 4.5 10/06/2017 0948   AST 18 10/06/2017 0948   ALT 28 10/06/2017 0948   ALKPHOS 74 10/06/2017 0948   BILITOT 0.5 10/06/2017 0948   GFRNONAA 76.20 03/30/2010 0000    No results found for: TOTALPROTELP, ALBUMINELP, A1GS, A2GS, BETS, BETA2SER, GAMS, MSPIKE, SPEI  No results found for: KPAFRELGTCHN, LAMBDASER, KAPLAMBRATIO  Lab  Results  Component Value Date   WBC 9.8 10/06/2017   NEUTROABS 5.4 10/06/2017   HGB 15.9 (H) 10/06/2017   HCT 47.1 (H) 10/06/2017   MCV 88.5 10/06/2017   PLT 203.0 10/06/2017    '@LASTCHEMISTRY' @  No results found for: LABCA2  No components found for: TTSVXB939  No results for input(s): INR in the last 168 hours.  No results found for: LABCA2  No results found for: QZE092  No results found for: ZRA076  No results found for: AUQ333  No results found for: CA2729  No components found for: HGQUANT  No results found for: CEA1 / No results found for: CEA1   No results found for: AFPTUMOR  No results found for: CHROMOGRNA  No results found for: PSA1  No visits with results within 3 Day(s) from this visit.  Latest known visit with results is:  Office Visit on 10/06/2017  Component Date Value Ref Range Status  . HM Mammogram 06/16/2017 Self Reported Normal  0-4 Bi-Rad, Self Reported Normal Final  . HM Pap smear 06/16/2017 completed   Final  . Cholesterol 10/06/2017 159  0 - 200 mg/dL Final   ATP III Classification       Desirable:  < 200 mg/dL               Borderline High:  200 - 239 mg/dL          High:  > = 240 mg/dL  . Triglycerides 10/06/2017 145.0  0.0 - 149.0 mg/dL Final   Normal:  <150 mg/dLBorderline High:  150 - 199 mg/dL  . HDL 10/06/2017 66.10  >39.00 mg/dL Final  . VLDL 10/06/2017 29.0  0.0 - 40.0 mg/dL Final  . LDL Cholesterol 10/06/2017 64  0 - 99 mg/dL Final  . Total CHOL/HDL Ratio 10/06/2017 2   Final                  Men          Women1/2 Average Risk     3.4          3.3Average Risk          5.0          4.42X Average Risk          9.6          7.13X Average Risk  15.0          11.0                      . NonHDL 10/06/2017 93.39   Final   NOTE:  Non-HDL goal should be 30 mg/dL higher than patient's LDL goal (i.e. LDL goal of < 70 mg/dL, would have non-HDL goal of < 100 mg/dL)  . Sodium 10/06/2017 140  135 - 145 mEq/L Final  . Potassium  10/06/2017 4.4  3.5 - 5.1 mEq/L Final  . Chloride 10/06/2017 102  96 - 112 mEq/L Final  . CO2 10/06/2017 29  19 - 32 mEq/L Final  . Glucose, Bld 10/06/2017 102* 70 - 99 mg/dL Final  . BUN 10/06/2017 13  6 - 23 mg/dL Final  . Creatinine, Ser 10/06/2017 0.86  0.40 - 1.20 mg/dL Final  . Calcium 10/06/2017 9.5  8.4 - 10.5 mg/dL Final  . GFR 10/06/2017 71.22  >60.00 mL/min Final  . TSH 10/06/2017 0.74  0.35 - 4.50 uIU/mL Final  . Total Bilirubin 10/06/2017 0.5  0.2 - 1.2 mg/dL Final  . Bilirubin, Direct 10/06/2017 0.1  0.0 - 0.3 mg/dL Final  . Alkaline Phosphatase 10/06/2017 74  39 - 117 U/L Final  . AST 10/06/2017 18  0 - 37 U/L Final  . ALT 10/06/2017 28  0 - 35 U/L Final  . Total Protein 10/06/2017 6.8  6.0 - 8.3 g/dL Final  . Albumin 10/06/2017 4.5  3.5 - 5.2 g/dL Final  . WBC 10/06/2017 9.8  4.0 - 10.5 K/uL Final  . RBC 10/06/2017 5.32* 3.87 - 5.11 Mil/uL Final  . Hemoglobin 10/06/2017 15.9* 12.0 - 15.0 g/dL Final  . HCT 10/06/2017 47.1* 36.0 - 46.0 % Final  . MCV 10/06/2017 88.5  78.0 - 100.0 fl Final  . MCHC 10/06/2017 33.8  30.0 - 36.0 g/dL Final  . RDW 10/06/2017 12.6  11.5 - 15.5 % Final  . Platelets 10/06/2017 203.0  150.0 - 400.0 K/uL Final  . Neutrophils Relative % 10/06/2017 55.1  43.0 - 77.0 % Final  . Lymphocytes Relative 10/06/2017 36.1  12.0 - 46.0 % Final  . Monocytes Relative 10/06/2017 4.8  3.0 - 12.0 % Final  . Eosinophils Relative 10/06/2017 3.5  0.0 - 5.0 % Final  . Basophils Relative 10/06/2017 0.5  0.0 - 3.0 % Final  . Neutro Abs 10/06/2017 5.4  1.4 - 7.7 K/uL Final  . Lymphs Abs 10/06/2017 3.5  0.7 - 4.0 K/uL Final  . Monocytes Absolute 10/06/2017 0.5  0.1 - 1.0 K/uL Final  . Eosinophils Absolute 10/06/2017 0.3  0.0 - 0.7 K/uL Final  . Basophils Absolute 10/06/2017 0.1  0.0 - 0.1 K/uL Final  . VITD 10/06/2017 46.55  30.00 - 100.00 ng/mL Final    (this displays the last labs from the last 3 days)  No results found for: TOTALPROTELP, ALBUMINELP, A1GS, A2GS,  BETS, BETA2SER, GAMS, MSPIKE, SPEI (this displays SPEP labs)  No results found for: KPAFRELGTCHN, LAMBDASER, KAPLAMBRATIO (kappa/lambda light chains)  No results found for: HGBA, HGBA2QUANT, HGBFQUANT, HGBSQUAN (Hemoglobinopathy evaluation)   No results found for: LDH  No results found for: IRON, TIBC, IRONPCTSAT (Iron and TIBC)  No results found for: FERRITIN  Urinalysis No results found for: COLORURINE, APPEARANCEUR, LABSPEC, PHURINE, GLUCOSEU, HGBUR, BILIRUBINUR, KETONESUR, PROTEINUR, UROBILINOGEN, NITRITE, LEUKOCYTESUR   STUDIES: DEXA scan 06/10/2016 reportedly normal  ELIGIBLE FOR AVAILABLE RESEARCH PROTOCOL: no  ASSESSMENT: 62 y.o. [place] woman status post left lumpectomy  09/05/2015 for atypical ductal hyperplasia  1. Anastrozole started in 07/2017  2. Biopsy on 01/09/2018 consistent with a complex sclerosing lesion in the right upper outer quadrant  PLAN:  Alexis Wood is doing well today.  She will continue on Anastrozole.  She is tolerating it well.  She met with Dr. Jana Hakim today to review her pathology results.  He recommended she go ahead and proceed with lumpectomy with Dr. Brantley Stage.  He will send him a note.  She will return to see Dr. Jana Hakim in 07/2018 for follow up.  I gave her a handout in her AVS about bone health.    Alexis Wood has a good understanding of the overall plan. She agrees with it. She will call with any problems that may develop before her next visit here.   Wilber Bihari, NP  01/12/18 2:42 PM Medical Oncology and Hematology [facility] [address] [place], [place] [address] Tel. [phone]    Fax. [phone]   ADDENDUM: Alexis Wood has another breast lesion which will need excision.  Hopefully this will again be benign.  I am concerned that this developed even while she is taking anastrozole.  I had expect that this would have suppressed changes in her breast for at least some time.  At any rate she would like  to postpone the surgery until May to coincide with the holiday the last Monday in May.  If she has her surgery may 24th she will have 3 days postop before she has to return to work.  That would allow her not to have to take any additional time off.  I see no problems with that brief delay and alerted Dr. Brantley Stage regarding her wishes.  We will see me again in 6 months or so.  She knows to call for any other issues that may develop before then.  I personally saw this patient and performed a substantive portion of this encounter with the listed APP documented above.   Chauncey Cruel, MD Medical Oncology and Hematology [facility] [address] [place], [place] [address] Tel. [phone]    Fax. [phone]

## 2018-01-13 ENCOUNTER — Telehealth: Payer: Self-pay | Admitting: Oncology

## 2018-01-13 NOTE — Telephone Encounter (Signed)
Scheduled appt per 4/29 sch message - pt is aware . Reminder letter sent in the mail.

## 2018-01-14 DIAGNOSIS — N6310 Unspecified lump in the right breast, unspecified quadrant: Secondary | ICD-10-CM

## 2018-01-14 HISTORY — DX: Unspecified lump in the right breast, unspecified quadrant: N63.10

## 2018-01-16 ENCOUNTER — Telehealth: Payer: Self-pay | Admitting: Oncology

## 2018-01-16 NOTE — Telephone Encounter (Signed)
Left message for patient regarding voicemail she left earlier to r/s upcoming November appointment from PM to AM.

## 2018-01-19 ENCOUNTER — Ambulatory Visit: Payer: Self-pay | Admitting: Surgery

## 2018-01-19 DIAGNOSIS — N6310 Unspecified lump in the right breast, unspecified quadrant: Secondary | ICD-10-CM

## 2018-01-19 NOTE — H&P (View-Only) (Signed)
Alexis Wood Documented: 01/19/2018 2:52 PM Location: [facility] Patient #: [id number] DOB: 11/22/1955 Married / Language: English / Race: White Female  History of Present Illness (Alexis Wood A. Alexis Morandi MD; 01/19/2018 3:19 PM) Patient words: Patient sent requested Dr. Steven Reed for right breast abnormality detected on screening MRI. She is found to have a lesion in the right breast upper quadrant core biopsy proven to be complex sclerosing lesion without atypia. Patient denies any history of breast pain and nipple discharge or breast mass. She has a history of left breast complex origin lesion and atypical ductal hyperplasia. She attended a high risk clinic and was felt to have a 30% risk of lifetime malignancy. She is opted for aromatase inhibitors for chemoprevention.  The patient is a 62 year old female.   Allergies (Danielle Gerrigner, CMA; 01/19/2018 2:52 PM) Penicillin V *PENICILLINS* Pristiq *ANTIDEPRESSANTS* Hives. Allergies Reconciled  Medication History (Danielle Gerrigner, CMA; 01/19/2018 2:53 PM) Anastrozole (1MG Tablet, Oral) Active. LORazepam (0.5MG Tablet, Oral) Active. ALPRAZolam (0.5MG Tablet, Oral) Active. BuPROPion HCl ER (XL) (150MG Tablet ER 24HR, Oral) Active. Levothyroxine Sodium (125MCG Tablet, Oral) Active. Losartan Potassium (50MG Tablet, Oral) Active. Eye Vitamins (Oral) Active. Cholecalciferol (2000UNIT Capsule, Oral) Active. Medications Reconciled    Vitals (Danielle Gerrigner CMA; 01/19/2018 2:54 PM) 01/19/2018 2:53 PM Weight: 176.13 lb Height: 63in Body Surface Area: 1.83 m Body Mass Index: 31.2 kg/m  Temp.: 98.2F(Oral)  Pulse: 60 (Regular)  BP: 150/86 (Sitting, Left Arm, Standard)      Physical Exam (Kanye Depree A. Kensi Karr MD; 01/19/2018 3:20 PM)  General Mental Status-Alert. General Appearance-Consistent with stated age. Hydration-Well hydrated. Voice-Normal.  Head and Neck Head-normocephalic,  atraumatic with no lesions or palpable masses. Trachea-midline. Thyroid Gland Characteristics - normal size and consistency.  Chest and Lung Exam Chest and lung exam reveals -quiet, even and easy respiratory effort with no use of accessory muscles and on auscultation, normal breath sounds, no adventitious sounds and normal vocal resonance. Inspection Chest Wall - Normal. Back - normal.  Breast Note: Left breast scars noted without mass lesion. Right breast normal except for bruising from biopsy of parietal quadrant.  Musculoskeletal Normal Exam - Left-Upper Extremity Strength Normal and Lower Extremity Strength Normal. Normal Exam - Right-Upper Extremity Strength Normal and Lower Extremity Strength Normal.  Lymphatic Head & Neck  General Head & Neck Lymphatics: Bilateral - Description - Normal. Axillary  General Axillary Region: Bilateral - Description - Normal. Tenderness - Non Tender.    Assessment & Plan (Mayrin Schmuck A. Allexa Acoff MD; 01/19/2018 3:18 PM)  BREAST MASS, RIGHT (N63.10) Impression: Complex sclerosing lesion. This was detected by MR biopsy. Recommend right breast lumpectomy to exclude upgrade to malignancy is high as 20%. Risk of lumpectomy include bleeding, infection, seroma, more surgery, use of seed/wire, wound care, cosmetic deformity and the need for other treatments, death , blood clots, death. Pt agrees to proceed.  Current Plans You are being scheduled for surgery- Our schedulers will call you.  You should hear from our office's scheduling department within 5 working days about the location, date, and time of surgery. We try to make accommodations for patient's preferences in scheduling surgery, but sometimes the OR schedule or the surgeon's schedule prevents us from making those accommodations.  If you have not heard from our office ([phone]) in 5 working days, call the office and ask for your surgeon's nurse.  If you have other questions about  your diagnosis, plan, or surgery, call the office and ask for your surgeon's nurse.  Pt   Education - [facility] Breast Biopsy HCI: discussed with patient and provided information. The anatomy and the physiology was discussed. The pathophysiology and natural history of the disease was discussed. Options were discussed and recommendations were made. Technique, risks, benefits, & alternatives were discussed. Risks such as stroke, heart attack, bleeding, indection, death, and other risks discussed. Questions answered. The patient agrees to proceed.

## 2018-01-19 NOTE — H&P (Signed)
Alexis Wood Documented: 01/19/2018 2:52 PM Location: [facility] Patient #: [id number] DOB: Aug 18, 1956 Married / Language: [name] / Race: White Female  History of Present Illness Alexis Fus A. Moya Duan MD; 01/19/2018 3:19 PM) Patient words: Patient sent requested Dr. Adriana Wood for right breast abnormality detected on screening MRI. She is found to have a lesion in the right breast upper quadrant core biopsy proven to be complex sclerosing lesion without atypia. Patient denies any history of breast pain and nipple discharge or breast mass. She has a history of left breast complex origin lesion and atypical ductal hyperplasia. She attended a high risk clinic and was felt to have a 30% risk of lifetime malignancy. She is opted for aromatase inhibitors for chemoprevention.  The patient is a 62 year old female.   Allergies (Alexis Wood, CMA; 01/19/2018 2:52 PM) Penicillin V *PENICILLINS* Pristiq *ANTIDEPRESSANTS* Hives. Allergies Reconciled  Medication History Alexis Wood, CMA; 01/19/2018 2:53 PM) Anastrozole (  Tablet, Oral) Active. LORazepam (0.5MG  Tablet, Oral) Active. ALPRAZolam (0.5MG  Tablet, Oral) Active. BuPROPion HCl ER (XL) (  Tablet ER 24HR, Oral) Active. Levothyroxine Sodium ( Tablet, Oral) Active. Losartan Potassium (  Tablet, Oral) Active. Eye Vitamins (Oral) Active. Cholecalciferol (2000UNIT Capsule, Oral) Active. Medications Reconciled    Vitals (Alexis Wood CMA; 01/19/2018 2:54 PM) 01/19/2018 2:53 PM Weight: 176.13 lb Height: 63in Body Surface Area: 1.83 m Body Mass Index: 31.2 kg/m  Temp.: 98.72F(Oral)  Pulse: 60 (Regular)  BP: 150/86 (Sitting, Left Arm, Standard)      Physical Exam (Alexis Wood A. Alexis Outten MD; 01/19/2018 3:20 PM)  General Mental Status-Alert. General Appearance-Consistent with stated age. Hydration-Well hydrated. Voice-Normal.  Head and Neck Head-normocephalic,  atraumatic with no lesions or palpable masses. Trachea-midline. Thyroid Gland Characteristics - normal size and consistency.  Chest and Lung Exam Chest and lung exam reveals -quiet, even and easy respiratory effort with no use of accessory muscles and on auscultation, normal breath sounds, no adventitious sounds and normal vocal resonance. Inspection Chest Wall - Normal. Back - normal.  Breast Note: Left breast scars noted without mass lesion. Right breast normal except for bruising from biopsy of parietal quadrant.  Musculoskeletal Normal Exam - Left-Upper Extremity Strength Normal and Lower Extremity Strength Normal. Normal Exam - Right-Upper Extremity Strength Normal and Lower Extremity Strength Normal.  Lymphatic Head & Neck  General Head & Neck Lymphatics: Bilateral - Description - Normal. Axillary  General Axillary Region: Bilateral - Description - Normal. Tenderness - Non Tender.    Assessment & Plan (Alexis Sherry A. Muadh Creasy MD; 01/19/2018 3:18 PM)  BREAST MASS, RIGHT (N63.10) Impression: Complex sclerosing lesion. This was detected by MR biopsy. Recommend right breast lumpectomy to exclude upgrade to malignancy is high as 20%. Risk of lumpectomy include bleeding, infection, seroma, more surgery, use of seed/wire, wound care, cosmetic deformity and the need for other treatments, death , blood clots, death. Pt agrees to proceed.  Current Plans You are being scheduled for surgery- Our schedulers will call you.  You should hear from our office's scheduling department within 5 working days about the location, date, and time of surgery. We try to make accommodations for patient's preferences in scheduling surgery, but sometimes the OR schedule or the surgeon's schedule prevents [place] from making those accommodations.  If you have not heard from our office [phone]) in 5 working days, call the office and ask for your surgeon's nurse.  If you have other questions about  your diagnosis, plan, or surgery, call the office and ask for your surgeon's nurse.  Pt  Education - [facility] Breast Biopsy HCI: discussed with patient and provided information. The anatomy and the physiology was discussed. The pathophysiology and natural history of the disease was discussed. Options were discussed and recommendations were made. Technique, risks, benefits, & alternatives were discussed. Risks such as stroke, heart attack, bleeding, indection, death, and other risks discussed. Questions answered. The patient agrees to proceed.

## 2018-01-27 ENCOUNTER — Ambulatory Visit: Payer: Self-pay | Admitting: Surgery

## 2018-01-27 ENCOUNTER — Other Ambulatory Visit: Payer: Self-pay | Admitting: Surgery

## 2018-01-27 DIAGNOSIS — N6310 Unspecified lump in the right breast, unspecified quadrant: Secondary | ICD-10-CM

## 2018-01-30 ENCOUNTER — Other Ambulatory Visit: Payer: Self-pay

## 2018-01-30 ENCOUNTER — Encounter (HOSPITAL_BASED_OUTPATIENT_CLINIC_OR_DEPARTMENT_OTHER): Payer: Self-pay | Admitting: *Deleted

## 2018-01-30 ENCOUNTER — Encounter (HOSPITAL_BASED_OUTPATIENT_CLINIC_OR_DEPARTMENT_OTHER)
Admission: RE | Admit: 2018-01-30 | Discharge: 2018-01-30 | Disposition: A | Discharge status: Home / Self Care | Payer: 59 | Source: Ambulatory Visit | Attending: Surgery | Admitting: Surgery

## 2018-01-30 DIAGNOSIS — Z01818 Encounter for other preprocedural examination: Secondary | ICD-10-CM | POA: Diagnosis present

## 2018-01-30 DIAGNOSIS — I10 Essential (primary) hypertension: Secondary | ICD-10-CM | POA: Diagnosis not present

## 2018-01-30 NOTE — Pre-Procedure Instructions (Signed)
To come for EKG and to pick up Ensure pre-surgery drink 10 oz. - to drink by 1015 DOS.

## 2018-01-30 NOTE — Progress Notes (Addendum)
Ensure pre surgery drink given with instructions to complete by 1015 dos, pt verbalized understanding.  EKG reviewed by Dr. Renold Don, will proceed with surgery as scheduled.

## 2018-02-03 ENCOUNTER — Ambulatory Visit
Admission: RE | Admit: 2018-02-03 | Discharge: 2018-02-03 | Disposition: A | Discharge status: Home / Self Care | Payer: 59 | Source: Ambulatory Visit | Attending: Surgery | Admitting: Surgery

## 2018-02-03 ENCOUNTER — Other Ambulatory Visit: Payer: Self-pay | Admitting: Surgery

## 2018-02-03 DIAGNOSIS — N6310 Unspecified lump in the right breast, unspecified quadrant: Secondary | ICD-10-CM

## 2018-02-03 DIAGNOSIS — R928 Other abnormal and inconclusive findings on diagnostic imaging of breast: Secondary | ICD-10-CM | POA: Diagnosis not present

## 2018-02-05 ENCOUNTER — Ambulatory Visit (HOSPITAL_BASED_OUTPATIENT_CLINIC_OR_DEPARTMENT_OTHER): Payer: 59 | Admitting: Anesthesiology

## 2018-02-05 ENCOUNTER — Encounter (HOSPITAL_BASED_OUTPATIENT_CLINIC_OR_DEPARTMENT_OTHER): Payer: Self-pay | Admitting: Anesthesiology

## 2018-02-05 ENCOUNTER — Other Ambulatory Visit: Payer: Self-pay

## 2018-02-05 ENCOUNTER — Encounter (HOSPITAL_BASED_OUTPATIENT_CLINIC_OR_DEPARTMENT_OTHER)
Admission: RE | Disposition: A | Discharge status: Home / Self Care | Payer: Self-pay | Source: Ambulatory Visit | Attending: Surgery

## 2018-02-05 ENCOUNTER — Ambulatory Visit
Admission: RE | Admit: 2018-02-05 | Discharge: 2018-02-05 | Disposition: A | Discharge status: Home / Self Care | Payer: 59 | Source: Ambulatory Visit | Attending: Surgery | Admitting: Surgery

## 2018-02-05 ENCOUNTER — Ambulatory Visit (HOSPITAL_BASED_OUTPATIENT_CLINIC_OR_DEPARTMENT_OTHER)
Admission: RE | Admit: 2018-02-05 | Discharge: 2018-02-05 | Disposition: A | Discharge status: Home / Self Care | Payer: 59 | Source: Ambulatory Visit | Attending: Surgery | Admitting: Surgery

## 2018-02-05 DIAGNOSIS — Z79899 Other long term (current) drug therapy: Secondary | ICD-10-CM | POA: Diagnosis not present

## 2018-02-05 DIAGNOSIS — R928 Other abnormal and inconclusive findings on diagnostic imaging of breast: Secondary | ICD-10-CM | POA: Diagnosis not present

## 2018-02-05 DIAGNOSIS — I10 Essential (primary) hypertension: Secondary | ICD-10-CM | POA: Diagnosis not present

## 2018-02-05 DIAGNOSIS — F17200 Nicotine dependence, unspecified, uncomplicated: Secondary | ICD-10-CM | POA: Diagnosis not present

## 2018-02-05 DIAGNOSIS — N6489 Other specified disorders of breast: Secondary | ICD-10-CM | POA: Insufficient documentation

## 2018-02-05 DIAGNOSIS — Z79890 Hormone replacement therapy (postmenopausal): Secondary | ICD-10-CM | POA: Insufficient documentation

## 2018-02-05 DIAGNOSIS — N6021 Fibroadenosis of right breast: Secondary | ICD-10-CM | POA: Diagnosis not present

## 2018-02-05 DIAGNOSIS — N6310 Unspecified lump in the right breast, unspecified quadrant: Secondary | ICD-10-CM

## 2018-02-05 DIAGNOSIS — F419 Anxiety disorder, unspecified: Secondary | ICD-10-CM | POA: Insufficient documentation

## 2018-02-05 DIAGNOSIS — N6011 Diffuse cystic mastopathy of right breast: Secondary | ICD-10-CM | POA: Diagnosis not present

## 2018-02-05 DIAGNOSIS — E039 Hypothyroidism, unspecified: Secondary | ICD-10-CM | POA: Insufficient documentation

## 2018-02-05 DIAGNOSIS — N6091 Unspecified benign mammary dysplasia of right breast: Secondary | ICD-10-CM | POA: Diagnosis not present

## 2018-02-05 HISTORY — PX: BREAST EXCISIONAL BIOPSY: SUR124

## 2018-02-05 HISTORY — DX: Unspecified lump in the right breast, unspecified quadrant: N63.10

## 2018-02-05 HISTORY — DX: Unspecified osteoarthritis, unspecified site: M19.90

## 2018-02-05 HISTORY — PX: BREAST LUMPECTOMY WITH RADIOACTIVE SEED LOCALIZATION: SHX6424

## 2018-02-05 HISTORY — DX: Dental restoration status: Z98.811

## 2018-02-05 SURGERY — BREAST LUMPECTOMY WITH RADIOACTIVE SEED LOCALIZATION
Anesthesia: General | Site: Breast | Laterality: Right

## 2018-02-05 MED ORDER — MIDAZOLAM HCL 2 MG/2ML IJ SOLN
INTRAMUSCULAR | Status: AC
  Filled 2018-02-05: qty 2

## 2018-02-05 MED ORDER — DEXAMETHASONE SODIUM PHOSPHATE 4 MG/ML IJ SOLN
INTRAMUSCULAR | Status: DC | PRN
  Administered 2018-02-05: 10 mg via INTRAVENOUS

## 2018-02-05 MED ORDER — CELECOXIB 200 MG PO CAPS
ORAL_CAPSULE | ORAL | Status: AC
  Filled 2018-02-05: qty 1

## 2018-02-05 MED ORDER — LACTATED RINGERS IV SOLN
INTRAVENOUS | Status: DC
  Administered 2018-02-05 (×2): via INTRAVENOUS

## 2018-02-05 MED ORDER — CHLORHEXIDINE GLUCONATE CLOTH 2 % EX PADS: 6.0000 | MEDICATED_PAD | Freq: Once | CUTANEOUS | Status: DC

## 2018-02-05 MED ORDER — OXYCODONE HCL 5 MG PO TABS: 5.0000 mg | ORAL_TABLET | Freq: Four times a day (QID) | ORAL | 0 refills | Status: DC | PRN

## 2018-02-05 MED ORDER — ACETAMINOPHEN 500 MG PO TABS
1000.0000 mg | ORAL_TABLET | ORAL | Status: AC
  Administered 2018-02-05: 1000 mg via ORAL

## 2018-02-05 MED ORDER — IBUPROFEN 800 MG PO TABS: 800.0000 mg | ORAL_TABLET | Freq: Three times a day (TID) | ORAL | 0 refills | Status: DC | PRN

## 2018-02-05 MED ORDER — LIDOCAINE HCL (CARDIAC) PF 100 MG/5ML IV SOSY
PREFILLED_SYRINGE | INTRAVENOUS | Status: AC
Start: 2018-02-05 — End: ?
  Filled 2018-02-05: qty 5

## 2018-02-05 MED ORDER — PROPOFOL 10 MG/ML IV BOLUS
INTRAVENOUS | Status: DC | PRN
  Administered 2018-02-05: 150 mg via INTRAVENOUS

## 2018-02-05 MED ORDER — OXYCODONE HCL 5 MG PO TABS
5.0000 mg | ORAL_TABLET | Freq: Once | ORAL | Status: AC
  Administered 2018-02-05: 5 mg via ORAL

## 2018-02-05 MED ORDER — HYDROCODONE-ACETAMINOPHEN 7.5-325 MG PO TABS
1.0000 | ORAL_TABLET | Freq: Once | ORAL | Status: DC | PRN
Start: 2018-02-05 — End: 2018-02-05

## 2018-02-05 MED ORDER — MEPERIDINE HCL 25 MG/ML IJ SOLN: 6.2500 mg | INTRAMUSCULAR | Status: DC | PRN

## 2018-02-05 MED ORDER — DEXAMETHASONE SODIUM PHOSPHATE 10 MG/ML IJ SOLN
INTRAMUSCULAR | Status: AC
Start: 2018-02-05 — End: ?
  Filled 2018-02-05: qty 1

## 2018-02-05 MED ORDER — GABAPENTIN 300 MG PO CAPS
ORAL_CAPSULE | ORAL | Status: AC
  Filled 2018-02-05: qty 1

## 2018-02-05 MED ORDER — MIDAZOLAM HCL 5 MG/5ML IJ SOLN
INTRAMUSCULAR | Status: DC | PRN
  Administered 2018-02-05: 2 mg via INTRAVENOUS

## 2018-02-05 MED ORDER — CIPROFLOXACIN IN D5W 400 MG/200ML IV SOLN
INTRAVENOUS | Status: AC
  Filled 2018-02-05: qty 200

## 2018-02-05 MED ORDER — GABAPENTIN 300 MG PO CAPS
300.0000 mg | ORAL_CAPSULE | ORAL | Status: AC
  Administered 2018-02-05: 300 mg via ORAL

## 2018-02-05 MED ORDER — FENTANYL CITRATE (PF) 100 MCG/2ML IJ SOLN: 25.0000 ug | INTRAMUSCULAR | Status: DC | PRN

## 2018-02-05 MED ORDER — LIDOCAINE HCL (CARDIAC) PF 100 MG/5ML IV SOSY
PREFILLED_SYRINGE | INTRAVENOUS | Status: DC | PRN
  Administered 2018-02-05: 30 mg via INTRAVENOUS

## 2018-02-05 MED ORDER — ACETAMINOPHEN 500 MG PO TABS
ORAL_TABLET | ORAL | Status: AC
Start: 2018-02-05 — End: ?
  Filled 2018-02-05: qty 2

## 2018-02-05 MED ORDER — MIDAZOLAM HCL 2 MG/2ML IJ SOLN: 1.0000 mg | INTRAMUSCULAR | Status: DC | PRN

## 2018-02-05 MED ORDER — FENTANYL CITRATE (PF) 100 MCG/2ML IJ SOLN
INTRAMUSCULAR | Status: DC | PRN
  Administered 2018-02-05: 100 ug via INTRAVENOUS

## 2018-02-05 MED ORDER — SCOPOLAMINE 1 MG/3DAYS TD PT72: 1.0000 | MEDICATED_PATCH | Freq: Once | TRANSDERMAL | Status: DC | PRN

## 2018-02-05 MED ORDER — CIPROFLOXACIN IN D5W 400 MG/200ML IV SOLN
400.0000 mg | INTRAVENOUS | Status: AC
  Administered 2018-02-05: 400 mg via INTRAVENOUS

## 2018-02-05 MED ORDER — FENTANYL CITRATE (PF) 100 MCG/2ML IJ SOLN
INTRAMUSCULAR | Status: AC
  Filled 2018-02-05: qty 2

## 2018-02-05 MED ORDER — FENTANYL CITRATE (PF) 100 MCG/2ML IJ SOLN: 50.0000 ug | INTRAMUSCULAR | Status: DC | PRN

## 2018-02-05 MED ORDER — ONDANSETRON HCL 4 MG/2ML IJ SOLN
INTRAMUSCULAR | Status: AC
Start: 2018-02-05 — End: ?
  Filled 2018-02-05: qty 2

## 2018-02-05 MED ORDER — CELECOXIB 200 MG PO CAPS
200.0000 mg | ORAL_CAPSULE | ORAL | Status: AC
  Administered 2018-02-05: 200 mg via ORAL

## 2018-02-05 MED ORDER — METOCLOPRAMIDE HCL 5 MG/ML IJ SOLN: 10.0000 mg | Freq: Once | INTRAMUSCULAR | Status: DC | PRN

## 2018-02-05 MED ORDER — BUPIVACAINE-EPINEPHRINE (PF) 0.25% -1:200000 IJ SOLN
INTRAMUSCULAR | Status: DC | PRN
  Administered 2018-02-05: 20 mL

## 2018-02-05 MED ORDER — OXYCODONE HCL 5 MG PO TABS
ORAL_TABLET | ORAL | Status: AC
Start: 2018-02-05 — End: ?
  Filled 2018-02-05: qty 1

## 2018-02-05 MED ORDER — PROPOFOL 10 MG/ML IV BOLUS
INTRAVENOUS | Status: AC
  Filled 2018-02-05: qty 20

## 2018-02-05 MED ORDER — CIPROFLOXACIN IN D5W 400 MG/200ML IV SOLN: 400.0000 mg | INTRAVENOUS | Status: DC

## 2018-02-05 MED ORDER — ONDANSETRON HCL 4 MG/2ML IJ SOLN
INTRAMUSCULAR | Status: DC | PRN
  Administered 2018-02-05: 4 mg via INTRAVENOUS

## 2018-02-05 SURGICAL SUPPLY — 34 items
ADH SKN CLS APL DERMABOND .7 (GAUZE/BANDAGES/DRESSINGS) ×1
BINDER BREAST XLRG (GAUZE/BANDAGES/DRESSINGS) ×2 IMPLANT
BLADE SURG 15 STRL LF DISP TIS (BLADE) ×1 IMPLANT
BLADE SURG 15 STRL SS (BLADE) ×2
CHLORAPREP W/TINT 26ML (MISCELLANEOUS) ×2 IMPLANT
COVER BACK TABLE 60X90IN (DRAPES) ×2 IMPLANT
COVER MAYO STAND STRL (DRAPES) ×2 IMPLANT
COVER PROBE W GEL 5X96 (DRAPES) ×2 IMPLANT
DERMABOND ADVANCED (GAUZE/BANDAGES/DRESSINGS) ×1
DERMABOND ADVANCED .7 DNX12 (GAUZE/BANDAGES/DRESSINGS) ×1 IMPLANT
DEVICE DUBIN W/COMP PLATE 8390 (MISCELLANEOUS) ×2 IMPLANT
DRAPE LAPAROTOMY 100X72 PEDS (DRAPES) ×2 IMPLANT
DRAPE UTILITY XL STRL (DRAPES) ×2 IMPLANT
ELECT COATED BLADE 2.86 ST (ELECTRODE) ×2 IMPLANT
ELECT REM PT RETURN 9FT ADLT (ELECTROSURGICAL) ×2
ELECTRODE REM PT RTRN 9FT ADLT (ELECTROSURGICAL) ×1 IMPLANT
GLOVE BIOGEL PI IND STRL 8 (GLOVE) ×1 IMPLANT
GLOVE BIOGEL PI INDICATOR 8 (GLOVE) ×1
GLOVE ECLIPSE 8.0 STRL XLNG CF (GLOVE) ×2 IMPLANT
GOWN STRL REUS W/ TWL LRG LVL3 (GOWN DISPOSABLE) ×2 IMPLANT
GOWN STRL REUS W/TWL LRG LVL3 (GOWN DISPOSABLE) ×4
KIT MARKER MARGIN INK (KITS) ×2 IMPLANT
NEEDLE HYPO 25X1 1.5 SAFETY (NEEDLE) ×2 IMPLANT
NS IRRIG 1000ML POUR BTL (IV SOLUTION) ×2 IMPLANT
PACK BASIN DAY SURGERY FS (CUSTOM PROCEDURE TRAY) ×2 IMPLANT
PENCIL BUTTON HOLSTER BLD 10FT (ELECTRODE) ×2 IMPLANT
SLEEVE SCD COMPRESS KNEE MED (MISCELLANEOUS) ×2 IMPLANT
SPONGE LAP 4X18 RFD (DISPOSABLE) ×2 IMPLANT
SUT MNCRL AB 4-0 PS2 18 (SUTURE) ×2 IMPLANT
SUT SILK 2 0 SH (SUTURE) IMPLANT
SUT VICRYL 3-0 CR8 SH (SUTURE) ×2 IMPLANT
SYR CONTROL 10ML LL (SYRINGE) ×2 IMPLANT
TOWEL OR 17X24 6PK STRL BLUE (TOWEL DISPOSABLE) ×2 IMPLANT
TOWEL OR NON WOVEN STRL DISP B (DISPOSABLE) ×2 IMPLANT

## 2018-02-05 NOTE — Anesthesia Postprocedure Evaluation (Signed)
Anesthesia Post Note  Patient: Alexis Wood  Procedure(s) Performed: RIGHT BREAST LUMPECTOMY WITH RADIOACTIVE SEED LOCALIZATION (Right Breast)     Patient location during evaluation: PACU Anesthesia Type: General Level of consciousness: awake and alert and oriented Pain management: pain level controlled Vital Signs Assessment: post-procedure vital signs reviewed and stable Respiratory status: spontaneous breathing, nonlabored ventilation and respiratory function stable Cardiovascular status: blood pressure returned to baseline and stable Postop Assessment: no apparent nausea or vomiting Anesthetic complications: no    Last Vitals:  Vitals:   02/05/18 1615 02/05/18 1637  BP: (!) 127/93 (!) 127/98  Pulse: 77 92  Resp: 14 16  Temp:  36.6 C  SpO2: 98% 100%    Last Pain:  Vitals:   02/05/18 1637  TempSrc:   PainSc: 4                  Mahlik Lenn A.

## 2018-02-05 NOTE — Op Note (Signed)
Preoperative diagnosis: Right breast complex sclerosing lesion  Postoperative diagnosis: Same  Procedure: Right breast seed lumpectomy  Surgeon: Erroll Luna, MD  Anesthesia: LMA with local consisting of 0.25% Sensorcaine with epinephrine  EBL: 10 cc  Specimen: Right breast tissue with clip in 2 seeds and verified by Faxitron  Drains: None  IV fluids: Per anesthesia record  Indications for procedure: The patient presents after mammographic and subsequent MRI evaluation revealed a lesion in her right breast upper outer quadrant.  Core biopsy showed a complex sclerosing lesion.  The patient desired excision after consulting with radiology and with myself.  We discussed the pros and cons of lumpectomy as well as potential upgrade risk of 20% given her previous history of breast cancer in the past.  She understood the above and desired lumpectomy.The procedure has been discussed with the patient. Alternatives to surgery have been discussed with the patient.  Risks of surgery include bleeding,  Infection,  Seroma formation, death,  and the need for further surgery.   The patient understands and wishes to proceed.  Description of procedure: The patient was met in the holding area.  Neoprobe was used to identify the seed in the right breast.  Questions were answered.  She was taken the operating room placed supine on the OR table.  After general anesthesia was initiated, the right breast was prepped and draped in sterile fashion timeout was done.  She received preoperative antibiotics.  Neoprobe views of possible identified in the right breast upper outer quadrant.  Both seeds were able to be detected separately.  Curvilinear incision made along the lateral border of the nipple areolar complex.  Dissection was carried out around both seeds in the clip which was in between the 2 seeds.  This was excised with grossly negative margins.  Faxitron image revealed both seeds and clip to be in the specimen.   Hemostasis achieved with cautery.  Wound closed with 3-0 Vicryl and 4-0 Monocryl.  Dermabond applied.  Breast binder placed.  All final counts were found to be correct.  The patient was awoke extubated taken to recovery in satisfactory condition.

## 2018-02-05 NOTE — Discharge Instructions (Signed)
[facility] Office Phone Number [phone]  BREAST BIOPSY/ LUMPTECTOMY: POST OP INSTRUCTIONS  Always review your discharge instruction sheet given to you by the facility where your surgery was performed.  IF YOU HAVE DISABILITY OR FAMILY LEAVE FORMS, YOU MUST BRING THEM TO THE OFFICE FOR PROCESSING.  DO NOT GIVE THEM TO YOUR DOCTOR.  1. A prescription for pain medication may be given to you upon discharge.  Take your pain medication as prescribed, if needed.  If narcotic pain medicine is not needed, then you may take acetaminophen (Tylenol) or ibuprofen (Advil) as needed. 2. Take your usually prescribed medications unless otherwise directed 3. If you need a refill on your pain medication, please contact your pharmacy.  They will contact our office to request authorization.  Prescriptions will not be filled after 5pm or on week-ends. 4. You should eat very light the first 24 hours after surgery, such as soup, crackers, pudding, etc.  Resume your normal diet the day after surgery. 5. Most patients will experience some swelling and bruising in the breast.  Ice packs and a good support bra will help.  Swelling and bruising can take several days to resolve.  6. It is common to experience some constipation if taking pain medication after surgery.  Increasing fluid intake and taking a stool softener will usually help or prevent this problem from occurring.  A mild laxative (Milk of Magnesia or Miralax) should be taken according to package directions if there are no bowel movements after 48 hours. 7. Unless discharge instructions indicate otherwise, you may remove your bandages 24-48 hours after surgery, and you may shower at that time.  You may have steri-strips (small skin tapes) in place directly over the incision.  These strips should be left on the skin for 7-10 days.  If your surgeon used skin glue on the incision, you may shower in 24 hours.  The glue will flake off over the next 2-3  weeks.  Any sutures or staples will be removed at the office during your follow-up visit. 8. ACTIVITIES:  You may resume regular daily activities (gradually increasing) beginning the next day.  Wearing a good support bra or sports bra minimizes pain and swelling.  You may have sexual intercourse when it is comfortable. a. You may drive when you no longer are taking prescription pain medication, you can comfortably wear a seatbelt, and you can safely maneuver your car and apply brakes. b. RETURN TO WORK:  ______________________________________________________________________________________ 9. You should see your doctor in the office for a follow-up appointment approximately two weeks after your surgery.  Your doctors nurse will typically make your follow-up appointment when she calls you with your pathology report.  Expect your pathology report 2-3 business days after your surgery.  You may call to check if you do not hear from [place] after three days. OTHER INSTRUCTIONS:  WHEN TO CALL YOUR DOCTOR: 1. Fever over 101.0 2. Nausea and/or vomiting. 3. Extreme swelling or bruising. 4. Continued bleeding from incision. 5. Increased pain, redness, or drainage from the incision.  The clinic staff is available to answer your questions during regular business hours.  Please dont hesitate to call and ask to speak to one of the nurses for clinical concerns.  If you have a medical emergency, go to the nearest emergency room or call 911.  A surgeon from [facility] is always on call at the hospital.  For further questions, please visit [email or web address]     Post Anesthesia Home Care  Instructions  Activity: Get plenty of rest for the remainder of the day. A responsible individual must stay with you for 24 hours following the procedure.  For the next 24 hours, DO NOT: -Drive a car -Advertising copywriter -Drink alcoholic beverages -Take any medication unless instructed by your  physician -Make any legal decisions or sign important papers.  Meals: Start with liquid foods such as gelatin or soup. Progress to regular foods as tolerated. Avoid greasy, spicy, heavy foods. If nausea and/or vomiting occur, drink only clear liquids until the nausea and/or vomiting subsides. Call your physician if vomiting continues.  Special Instructions/Symptoms: Your throat may feel dry or sore from the anesthesia or the breathing tube placed in your throat during surgery. If this causes discomfort, gargle with warm salt water. The discomfort should disappear within 24 hours.  If you had a scopolamine patch placed behind your ear for the management of post- operative nausea and/or vomiting:  1. The medication in the patch is effective for 72 hours, after which it should be removed.  Wrap patch in a tissue and discard in the trash. Wash hands thoroughly with soap and water. 2. You may remove the patch earlier than 72 hours if you experience unpleasant side effects which may include dry mouth, dizziness or visual disturbances. 3. Avoid touching the patch. Wash your hands with soap and water after contact with the patch.

## 2018-02-05 NOTE — Interval H&P Note (Signed)
History and Physical Interval Note:  02/05/2018 2:00 PM  Alexis Wood  has presented today for surgery, with the diagnosis of right breast mass  The various methods of treatment have been discussed with the patient and family. After consideration of risks, benefits and other options for treatment, the patient has consented to  Procedure(s): RIGHT BREAST LUMPECTOMY WITH RADIOACTIVE SEED LOCALIZATION (Right) as a surgical intervention .  The patient's history has been reviewed, patient examined, no change in status, stable for surgery.  I have reviewed the patient's chart and labs.  Questions were answered to the patient's satisfaction.     Modean Mccullum A Danasha Melman

## 2018-02-05 NOTE — Anesthesia Procedure Notes (Signed)
Procedure Name: LMA Insertion Date/Time: 02/05/2018 2:38 PM Performed by: [place] Desanctis, CRNA Pre-anesthesia Checklist: Patient identified, Emergency Drugs available, Suction available, Patient being monitored and Timeout performed Patient Re-evaluated:Patient Re-evaluated prior to induction Oxygen Delivery Method: Circle system utilized Preoxygenation: Pre-oxygenation with 100% oxygen Induction Type: IV induction Ventilation: Mask ventilation without difficulty LMA: LMA inserted LMA Size: 4.0 Number of attempts: 1 Airway Equipment and Method: Bite block Placement Confirmation: positive ETCO2 Tube secured with: Tape Dental Injury: Teeth and Oropharynx as per pre-operative assessment

## 2018-02-05 NOTE — Transfer of Care (Signed)
Immediate Anesthesia Transfer of Care Note  Patient: Alexis Wood  Procedure(s) Performed: RIGHT BREAST LUMPECTOMY WITH RADIOACTIVE SEED LOCALIZATION (Right Breast)  Patient Location: PACU  Anesthesia Type:General  Level of Consciousness: sedated  Airway & Oxygen Therapy: Patient Spontanous Breathing and Patient connected to face mask oxygen  Post-op Assessment: Report given to RN and Post -op Vital signs reviewed and stable  Post vital signs: Reviewed and stable  Last Vitals:  Vitals Value Taken Time  BP 119/78 02/05/2018  3:30 PM  Temp    Pulse 81 02/05/2018  3:31 PM  Resp 10 02/05/2018  3:31 PM  SpO2 100 % 02/05/2018  3:31 PM  Vitals shown include unvalidated device data.  Last Pain:  Vitals:   02/05/18 1233  TempSrc:   PainSc: 0-No pain      Patients Stated Pain Goal: 3 (02/05/18 1233)  Complications: No apparent anesthesia complications

## 2018-02-05 NOTE — Anesthesia Preprocedure Evaluation (Signed)
Anesthesia Evaluation  Patient identified by MRN, date of birth, ID band Patient awake    Reviewed: Allergy & Precautions, NPO status , Patient's Chart, lab work & pertinent test results  Airway Mallampati: II  TM Distance: >3 FB Neck ROM: Full    Dental no notable dental hx. (+) Teeth Intact, Caps   Pulmonary Current Smoker,    Pulmonary exam normal breath sounds clear to auscultation       Cardiovascular hypertension, Pt. on medications Normal cardiovascular exam Rhythm:Regular Rate:Normal     Neuro/Psych Anxiety  Neuromuscular disease    GI/Hepatic negative GI ROS, Neg liver ROS,   Endo/Other  Hypothyroidism Hyperlipidemia Right breast mass  Renal/GU negative Renal ROS  negative genitourinary   Musculoskeletal  (+) Arthritis , Osteoarthritis,    Abdominal (+) + obese,   Peds  Hematology negative hematology ROS (+)   Anesthesia Other Findings   Reproductive/Obstetrics                             Anesthesia Physical Anesthesia Plan  ASA: II  Anesthesia Plan: General   Post-op Pain Management:    Induction: Intravenous  PONV Risk Score and Plan: 3 and Ondansetron, Dexamethasone, Midazolam and Treatment may vary due to age or medical condition  Airway Management Planned: LMA  Additional Equipment:   Intra-op Plan:   Post-operative Plan: Extubation in OR  Informed Consent: I have reviewed the patients History and Physical, chart, labs and discussed the procedure including the risks, benefits and alternatives for the proposed anesthesia with the patient or authorized representative who has indicated his/her understanding and acceptance.   Dental advisory given  Plan Discussed with: CRNA, Anesthesiologist and Surgeon  Anesthesia Plan Comments:         Anesthesia Quick Evaluation

## 2018-02-06 ENCOUNTER — Encounter (HOSPITAL_BASED_OUTPATIENT_CLINIC_OR_DEPARTMENT_OTHER): Payer: Self-pay | Admitting: Surgery

## 2018-02-16 ENCOUNTER — Other Ambulatory Visit: Payer: Self-pay | Admitting: Family Medicine

## 2018-02-16 NOTE — Telephone Encounter (Signed)
Last OV 10/06/17 Alprazolam last filled 01/05/18 #60 with 0

## 2018-03-18 ENCOUNTER — Other Ambulatory Visit: Payer: Self-pay | Admitting: Family Medicine

## 2018-03-18 NOTE — Telephone Encounter (Signed)
Last OV 10/06/17, Next OV 03/30/18  Last filled 02/17/18, # 60 with 0 refills

## 2018-03-28 ENCOUNTER — Other Ambulatory Visit: Payer: Self-pay | Admitting: Family Medicine

## 2018-03-30 ENCOUNTER — Encounter: Payer: Self-pay | Admitting: Family Medicine

## 2018-03-30 ENCOUNTER — Other Ambulatory Visit: Payer: Self-pay

## 2018-03-30 ENCOUNTER — Ambulatory Visit: Payer: 59 | Admitting: Family Medicine

## 2018-03-30 VITALS — BP 130/83 | HR 102 | Temp 98.1°F | Resp 16 | Ht 63.0 in | Wt 177.4 lb

## 2018-03-30 DIAGNOSIS — I10 Essential (primary) hypertension: Secondary | ICD-10-CM | POA: Diagnosis not present

## 2018-03-30 DIAGNOSIS — E039 Hypothyroidism, unspecified: Secondary | ICD-10-CM

## 2018-03-30 DIAGNOSIS — E785 Hyperlipidemia, unspecified: Secondary | ICD-10-CM

## 2018-03-30 NOTE — Assessment & Plan Note (Signed)
Chronic problem.  Pt is attempting to control w/ healthy diet and regular exercise but admits she is not exercising.  Stressed need for both.  Check labs and adjust tx prn.

## 2018-03-30 NOTE — Progress Notes (Signed)
   Subjective:    Patient ID: Alexis ConradiKaren F Wood, female    DOB: 16-Dec-1955, 62 y.o.   MRN: [id number]  HPI HTN- chronic problem, on Losartan 50mg  daily.  No CP, SOB, HAs, visual changes, edema.  Hyperlipidemia- chronic problem, attempting to control w/ diet and exercise.  Not currently on meds.  Pt's BMI is 31.4.  No abd pain, N/V.  No regular exercise.  Hypothyroid- chronic problem, on Levothyroxine 112mcg daily.  Some fatigue.  No changes to skin/hair/nails.   Review of Systems For ROS see HPI     Objective:   Physical Exam  Constitutional: She is oriented to person, place, and time. She appears well-developed and well-nourished. No distress.  obese  HENT:  Head: Normocephalic and atraumatic.  Eyes: Pupils are equal, round, and reactive to light. Conjunctivae and EOM are normal.  Neck: Normal range of motion. Neck supple. No thyromegaly present.  Cardiovascular: Normal rate, regular rhythm, normal heart sounds and intact distal pulses.  No murmur heard. Pulmonary/Chest: Effort normal and breath sounds normal. No respiratory distress.  Abdominal: Soft. She exhibits no distension. There is no tenderness.  Musculoskeletal: She exhibits no edema.  Lymphadenopathy:    She has no cervical adenopathy.  Neurological: She is alert and oriented to person, place, and time.  Skin: Skin is warm and dry.  Psychiatric: She has a normal mood and affect. Her behavior is normal.  Vitals reviewed.         Assessment & Plan:

## 2018-03-30 NOTE — Patient Instructions (Signed)
Schedule your complete physical in 6 months We'll notify you of your lab results and make any changes if needed Continue to work on healthy diet and regular exercise- you can do it! Call with any questions or concerns Have a great summer!!! 

## 2018-03-30 NOTE — Assessment & Plan Note (Signed)
Chronic problem.  Adequate control.  Asymptomatic.  Check labs.  No anticipated med changes 

## 2018-03-30 NOTE — Assessment & Plan Note (Signed)
Chronic problem.  + fatigue but denies other hypothyroid sxs.  Check labs.  Adjust meds prn

## 2018-03-31 ENCOUNTER — Encounter: Payer: Self-pay | Admitting: Family Medicine

## 2018-03-31 LAB — CBC WITH DIFFERENTIAL/PLATELET
BASOS PCT: 1.2 % (ref 0.0–3.0)
Basophils Absolute: 0.1 10*3/uL (ref 0.0–0.1)
EOS ABS: 0.5 10*3/uL (ref 0.0–0.7)
Eosinophils Relative: 5.6 % — ABNORMAL HIGH (ref 0.0–5.0)
HEMATOCRIT: 45.4 % (ref 36.0–46.0)
Hemoglobin: 15.5 g/dL — ABNORMAL HIGH (ref 12.0–15.0)
LYMPHS ABS: 4.5 10*3/uL — AB (ref 0.7–4.0)
Lymphocytes Relative: 49.3 % — ABNORMAL HIGH (ref 12.0–46.0)
MCHC: 34.1 g/dL (ref 30.0–36.0)
MCV: 87.6 fl (ref 78.0–100.0)
MONO ABS: 0.8 10*3/uL (ref 0.1–1.0)
Monocytes Relative: 8.4 % (ref 3.0–12.0)
NEUTROS ABS: 3.2 10*3/uL (ref 1.4–7.7)
NEUTROS PCT: 35.5 % — AB (ref 43.0–77.0)
PLATELETS: 175 10*3/uL (ref 150.0–400.0)
RBC: 5.19 Mil/uL — ABNORMAL HIGH (ref 3.87–5.11)
RDW: 12.7 % (ref 11.5–15.5)
WBC: 9.1 10*3/uL (ref 4.0–10.5)

## 2018-03-31 LAB — TSH: TSH: 2.17 u[IU]/mL (ref 0.35–4.50)

## 2018-03-31 LAB — HEPATIC FUNCTION PANEL
ALBUMIN: 4.4 g/dL (ref 3.5–5.2)
ALK PHOS: 90 U/L (ref 39–117)
ALT: 32 U/L (ref 0–35)
AST: 22 U/L (ref 0–37)
BILIRUBIN DIRECT: 0.1 mg/dL (ref 0.0–0.3)
Total Bilirubin: 0.3 mg/dL (ref 0.2–1.2)
Total Protein: 7 g/dL (ref 6.0–8.3)

## 2018-03-31 LAB — BASIC METABOLIC PANEL
BUN: 14 mg/dL (ref 6–23)
CHLORIDE: 101 meq/L (ref 96–112)
CO2: 30 meq/L (ref 19–32)
Calcium: 9.6 mg/dL (ref 8.4–10.5)
Creatinine, Ser: 0.99 mg/dL (ref 0.40–1.20)
GFR: 60.45 mL/min (ref >60.00)
GLUCOSE: 104 mg/dL — AB (ref 70–99)
POTASSIUM: 4.3 meq/L (ref 3.5–5.1)
SODIUM: 141 meq/L (ref 135–145)

## 2018-03-31 LAB — LIPID PANEL
CHOLESTEROL: 194 mg/dL (ref 0–200)
HDL: 63.9 mg/dL (ref >39.00)
LDL Cholesterol: 90 mg/dL (ref 0–99)
NONHDL: 129.66
Total CHOL/HDL Ratio: 3
Triglycerides: 196 mg/dL — ABNORMAL HIGH (ref 0.0–149.0)
VLDL: 39.2 mg/dL (ref 0.0–40.0)

## 2018-04-01 ENCOUNTER — Encounter: Payer: Self-pay | Admitting: General Practice

## 2018-04-03 ENCOUNTER — Other Ambulatory Visit: Payer: Self-pay | Admitting: Family Medicine

## 2018-04-06 ENCOUNTER — Ambulatory Visit: Payer: Self-pay | Admitting: Family Medicine

## 2018-04-27 ENCOUNTER — Other Ambulatory Visit: Payer: Self-pay | Admitting: Family Medicine

## 2018-04-27 ENCOUNTER — Encounter: Payer: Self-pay | Admitting: Family Medicine

## 2018-04-27 NOTE — Telephone Encounter (Signed)
Last OV 03/30/18, Next OV 10/13/2018  Last filled 03/18/18,  # 60 with 0 refills

## 2018-05-08 DIAGNOSIS — H353222 Exudative age-related macular degeneration, left eye, with inactive choroidal neovascularization: Secondary | ICD-10-CM | POA: Diagnosis not present

## 2018-05-08 DIAGNOSIS — H2511 Age-related nuclear cataract, right eye: Secondary | ICD-10-CM | POA: Diagnosis not present

## 2018-05-08 DIAGNOSIS — H353123 Nonexudative age-related macular degeneration, left eye, advanced atrophic without subfoveal involvement: Secondary | ICD-10-CM | POA: Diagnosis not present

## 2018-05-21 ENCOUNTER — Encounter: Payer: Self-pay | Admitting: Family Medicine

## 2018-05-26 DIAGNOSIS — H2511 Age-related nuclear cataract, right eye: Secondary | ICD-10-CM | POA: Diagnosis not present

## 2018-05-26 DIAGNOSIS — H353123 Nonexudative age-related macular degeneration, left eye, advanced atrophic without subfoveal involvement: Secondary | ICD-10-CM | POA: Diagnosis not present

## 2018-05-26 DIAGNOSIS — H43812 Vitreous degeneration, left eye: Secondary | ICD-10-CM | POA: Diagnosis not present

## 2018-05-26 DIAGNOSIS — H353222 Exudative age-related macular degeneration, left eye, with inactive choroidal neovascularization: Secondary | ICD-10-CM | POA: Diagnosis not present

## 2018-06-05 DIAGNOSIS — H353222 Exudative age-related macular degeneration, left eye, with inactive choroidal neovascularization: Secondary | ICD-10-CM | POA: Diagnosis not present

## 2018-06-05 DIAGNOSIS — H25013 Cortical age-related cataract, bilateral: Secondary | ICD-10-CM | POA: Diagnosis not present

## 2018-06-05 DIAGNOSIS — H2513 Age-related nuclear cataract, bilateral: Secondary | ICD-10-CM | POA: Diagnosis not present

## 2018-06-05 DIAGNOSIS — H524 Presbyopia: Secondary | ICD-10-CM | POA: Diagnosis not present

## 2018-06-08 ENCOUNTER — Encounter: Payer: Self-pay | Admitting: Family Medicine

## 2018-06-08 MED ORDER — VARENICLINE TARTRATE 0.5 MG X 11 & 1 MG X 42 PO MISC: ORAL | 0 refills | Status: DC

## 2018-06-08 MED ORDER — VARENICLINE TARTRATE 1 MG PO TABS: 1.0000 mg | ORAL_TABLET | Freq: Two times a day (BID) | ORAL | 2 refills | Status: DC

## 2018-06-13 DIAGNOSIS — Z23 Encounter for immunization: Secondary | ICD-10-CM | POA: Diagnosis not present

## 2018-06-18 DIAGNOSIS — Z6831 Body mass index (BMI) 31.0-31.9, adult: Secondary | ICD-10-CM | POA: Diagnosis not present

## 2018-06-18 DIAGNOSIS — Z01419 Encounter for gynecological examination (general) (routine) without abnormal findings: Secondary | ICD-10-CM | POA: Diagnosis not present

## 2018-06-23 ENCOUNTER — Other Ambulatory Visit: Payer: Self-pay | Admitting: Family Medicine

## 2018-07-08 ENCOUNTER — Other Ambulatory Visit: Payer: Self-pay | Admitting: Family Medicine

## 2018-07-08 NOTE — Telephone Encounter (Signed)
Last OV 03/30/18 Alprazolam last filled 04/27/18 #60 with 1

## 2018-07-13 LAB — HM PAP SMEAR

## 2018-07-13 LAB — HM MAMMOGRAPHY: HM Mammogram: NORMAL (ref 0–4)

## 2018-07-26 NOTE — Progress Notes (Signed)
[facility]  [phone] [phone]     ID: Alexis Wood DOB: 07/10/1956  MR#: [id number]  CSN#:[id number]  Patient Care Team: Tabori, Katherine E, MD as PCP - General (Family Medicine) McComb, John, MD as Consulting Physician (Obstetrics and Gynecology) Cornett, Thomas, MD as Consulting Physician (General Surgery) Magrinat, Gustav C, MD as Consulting Physician (Oncology) OTHER MD:  CHIEF COMPLAINT: Atypical ductal hyperplasia  CURRENT TREATMENT: Anastrozole; intensified screening   HISTORY OF CURRENT ILLNESS: From the original intake note:  Alexis Wood had routine screening mammography June 08, 2015 showing a possible abnormality in the left breast.  On 06/19/2015 she underwent left diagnostic mammography with tomography and left breast ultrasonography, showing the breast density to be category C.  In the left breast upper outer quadrant there was a persistent area of distortion, with a second area approximately 3.5 cm medially to the first.  Accordingly on June 29, 2017 she underwent biopsy of the 2 left breast areas in question, but one in the upper inner quadrant and one in the upper outer quadrant, both showing complex sclerosing lesions.  ([id number]).  The patient then met with Dr. Cornett and after appropriate discussion on September 05, 2015 proceeded to left double lumpectomy.  The pathology from this procedure ([id number]) showed in the more lateral lesion a complex Raulerson lesion with atypical ductal hyperplasia.  The more medial lesion showed a complex sclerosing lesion without atypia.  The patient is now referred for discussion of her risk of developing breast cancer in the future  Her subsequent history is as detailed below.  INTERVAL HISTORY: Alexis Wood is a high risk breast clinic patient here for routine follow up today. Alexis Wood continues on Anastrozole daily with good tolerance; she is obtaining the drug at approximately $7 per  month. She get a few night sweats but says she has a really bad one right before she goes to bed.  Vaginal dryness is not a major issue.  Since her last visit she has had a right lumpectomy for the complex sclerosing lesion previously noted, performed 02/06/2028, showing (SZA 19-10/21/2005) a complex sclerosing lesion, but no evidence of malignancy.  Mammogram surgical specimen of breast on 02/05/18 that showed:  both radioactive seeds and a dumbbell-shaped biopsy marker to be present and intact.   She also has had mammogram on 06/19/18 that showed: That showed no malignancy. Since her lumpectomy, she wasn't in as much pain as she was last time, but overall is doing well.  Breast density was category B.   REVIEW OF SYSTEMS: Alexis Wood Is about to have cataracts surgery.  She is taking Chantix and stopped smoking 07/16/2018. She get nauseous when she smells cigarette smoke.  She already can tell a change for the better in her breathing when she walks upstairs. Sometimes she has to force herself to eat due to the medication supressing her appetite. She is concerned about gaining weight. She denies unusual headaches, visual changes, nausea, vomiting, or dizziness. There has been no unusual cough, phlegm production, or pleurisy. This been no change in bowel or bladder habits. She denies unexplained fatigue or unexplained weight loss, bleeding, rash, or fever. A detailed review of systems was otherwise stable.      PAST MEDICAL HISTORY: Past Medical History:  Diagnosis Date  . Anxiety   . Arthritis    hands  . Dental crowns present   . Hypertension    states under control with med., has been on med. "several years"  . Hypothyroidism   .   Macular degeneration   . Mass of right breast 01/2018    PAST SURGICAL HISTORY: Past Surgical History:  Procedure Laterality Date  . BREAST LUMPECTOMY WITH NEEDLE LOCALIZATION Left 09/05/2015   Procedure: LEFT BREAST WIRE LOCALIZED  LUMPECTOMY TIMES 2;  Surgeon: Erroll Luna, MD;  Location: [facility];  Service: General;  Laterality: Left;  . BREAST LUMPECTOMY WITH RADIOACTIVE SEED LOCALIZATION Right 02/05/2018   Procedure: RIGHT BREAST LUMPECTOMY WITH RADIOACTIVE SEED LOCALIZATION;  Surgeon: Erroll Luna, MD;  Location: [facility];  Service: General;  Laterality: Right;  . DILATATION & CURRETTAGE/HYSTEROSCOPY WITH RESECTOCOPE  02/21/2006  . THYROIDECTOMY  Age 62    FAMILY HISTORY Family History  Problem Relation Age of Onset  . Coronary artery disease Mother   . Hypertension Mother   . Dementia Mother   . Coronary artery disease Father   . Hypertension Father   . Diabetes Father   . Breast cancer Maternal Aunt   . Diabetes type I Unknown   Patient's father passed away in 12/05/13 at age 82 from a massive heart attack. He had a pacemaker. Patient's mother passed away in June 07, 2017 at age 78. She had dementia. The patient only has one sister. Her mother had three sisters and one of them had breast cancer when she was in her 55's.    GYNECOLOGIC HISTORY:  Patient's last menstrual period was 03/09/2004. Menarche at 62 years old. GXP1, First carried to term at 62 years old. Was on birth control pills for about 14 years with no issues. Menopause in her 52's with no hormone replacement.  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 She lives at home with her husband, Alexis Wood, who is retired with disabilities. Her only daughter, Alexis Wood, lives in [place], [place] and does research for [employer]. The patient is not a Banker.    ADVANCED DIRECTIVES:    HEALTH MAINTENANCE: Social History   Tobacco Use  . Smoking status: Current Every Day Smoker    Packs/day: 0.50    Types: Cigarettes  . Smokeless tobacco: Never Used  Substance Use Topics  . Alcohol use: No  . Drug use: No     Colonoscopy:  PAP:  Bone density:   Allergies  Allergen Reactions  .  Penicillins Hives  . Pristiq [Desvenlafaxine] Hives    Current Outpatient Medications  Medication Sig Dispense Refill  . ALPRAZolam (XANAX) 1 MG tablet TAKE 1 TABLET BY MOUTH TWICE DAILY AS NEEDED FOR ANXIETY 60 tablet 0  . anastrozole (ARIMIDEX) 1 MG tablet Take 1 tablet (1 mg total) by mouth daily. 90 tablet 4  . buPROPion (WELLBUTRIN XL) 300 MG 24 hr tablet TAKE 1 TABLET(300 MG) BY MOUTH DAILY 30 tablet 6  . ibuprofen (ADVIL,MOTRIN) 800 MG tablet Take 1 tablet (800 mg total) by mouth every 8 (eight) hours as needed. 30 tablet 0  . levothyroxine (SYNTHROID, LEVOTHROID) 112 MCG tablet TAKE 1 TABLET(112 MCG) BY MOUTH DAILY 90 tablet 0  . losartan (COZAAR) 50 MG tablet TAKE 1 TABLET(50 MG) BY MOUTH DAILY 90 tablet 0  . Multiple Vitamins-Minerals (PRESERVISION AREDS 2) CAPS Take by mouth.    . varenicline (CHANTIX CONTINUING MONTH PAK) 1 MG tablet Take 1 tablet (1 mg total) by mouth 2 (two) times daily. 60 tablet 2  . varenicline (CHANTIX STARTING MONTH PAK) 0.5 MG X 11 & 1 MG X 42 tablet Take one 0.5 mg tab PO once daily x3  days, then increase to one 0.5 mg tab BID x4 days, then increase to one 1 mg tablet twice daily. 53 tablet 0  . Vitamin D, Ergocalciferol, 2000 units CAPS Take by mouth.     No current facility-administered medications for this visit.     OBJECTIVE: Middle-aged white woman in no acute distress  Vitals:   07/27/18 0954  BP: (!) 149/95  Pulse: 95  Resp: 18  Temp: 97.7 F (36.5 C)  SpO2: 98%     Body mass index is 30.75 kg/m.   Wt Readings from Last 3 Encounters:  07/27/18 173 lb 9.6 oz (78.7 kg)  03/30/18 177 lb 6 oz (80.5 kg)  02/05/18 175 lb (79.4 kg)      ECOG FS:0 - Asymptomatic  Sclerae unicteric, EOMs intact Oropharynx clear and moist No cervical or supraclavicular adenopathy Lungs no rales or rhonchi Heart regular rate and rhythm Abd soft, nontender, positive bowel sounds MSK no focal spinal tenderness, no upper extremity lymphedema Neuro:  nonfocal, well oriented, appropriate affect Breasts: Status post bilateral lumpectomies.  There is no evidence of disease activity.  There are no skin or nipple changes of concern.  Both axillae are benign.     LAB RESULTS:  CMP     Component Value Date/Time   NA 141 03/30/2018 1632   K 4.3 03/30/2018 1632   CL 101 03/30/2018 1632   CO2 30 03/30/2018 1632   GLUCOSE 104 (H) 03/30/2018 1632   GLUCOSE 66 10/12/2009   BUN 14 03/30/2018 1632   CREATININE 0.99 03/30/2018 1632   CREATININE 0.94 02/07/2017 1542   CALCIUM 9.6 03/30/2018 1632   PROT 7.0 03/30/2018 1632   ALBUMIN 4.4 03/30/2018 1632   AST 22 03/30/2018 1632   ALT 32 03/30/2018 1632   ALKPHOS 90 03/30/2018 1632   BILITOT 0.3 03/30/2018 1632   GFRNONAA 76.20 03/30/2010 0000    No results found for: TOTALPROTELP, ALBUMINELP, A1GS, A2GS, BETS, BETA2SER, GAMS, MSPIKE, SPEI  No results found for: KPAFRELGTCHN, LAMBDASER, KAPLAMBRATIO  Lab Results  Component Value Date   WBC 9.1 03/30/2018   NEUTROABS 3.2 03/30/2018   HGB 15.5 (H) 03/30/2018   HCT 45.4 03/30/2018   MCV 87.6 03/30/2018   PLT 175.0 03/30/2018    _0 @  No results found for: LABCA2  No components found for: XAJOIN867  No results for input(s): INR in the last 168 hours.  No results found for: LABCA2  No results found for: EHM094  No results found for: BSJ628  No results found for: ZMO294  No results found for: CA2729  No components found for: HGQUANT  No results found for: CEA1 / No results found for: CEA1   No results found for: AFPTUMOR  No results found for: CHROMOGRNA  No results found for: PSA1  No visits with results within 3 Day(s) from this visit.  Latest known visit with results is:  Office Visit on 03/30/2018  Component Date Value Ref Range Status  . Cholesterol 03/30/2018 194  0 - 200 mg/dL Final   ATP III Classification       Desirable:  < 200 mg/dL               Borderline High:  200 - 239 mg/dL           High:  > = 240 mg/dL  . Triglycerides 03/30/2018 196.0* 0.0 - 149.0 mg/dL Final   Normal:  <150 mg/dLBorderline High:  150 - 199 mg/dL  . HDL 03/30/2018 63.90  >  39.00 mg/dL Final  . VLDL 03/30/2018 39.2  0.0 - 40.0 mg/dL Final  . LDL Cholesterol 03/30/2018 90  0 - 99 mg/dL Final  . Total CHOL/HDL Ratio 03/30/2018 3   Final                  Men          Women1/2 Average Risk     3.4          3.3Average Risk          5.0          4.42X Average Risk          9.6          7.13X Average Risk          15.0          11.0                      . NonHDL 03/30/2018 129.66   Final   NOTE:  Non-HDL goal should be 30 mg/dL higher than patient's LDL goal (i.e. LDL goal of < 70 mg/dL, would have non-HDL goal of < 100 mg/dL)  . Sodium 03/30/2018 141  135 - 145 mEq/L Final  . Potassium 03/30/2018 4.3  3.5 - 5.1 mEq/L Final  . Chloride 03/30/2018 101  96 - 112 mEq/L Final  . CO2 03/30/2018 30  19 - 32 mEq/L Final  . Glucose, Bld 03/30/2018 104* 70 - 99 mg/dL Final  . BUN 03/30/2018 14  6 - 23 mg/dL Final  . Creatinine, Ser 03/30/2018 0.99  0.40 - 1.20 mg/dL Final  . Calcium 03/30/2018 9.6  8.4 - 10.5 mg/dL Final  . GFR 03/30/2018 60.45  >60.00 mL/min Final  . Total Bilirubin 03/30/2018 0.3  0.2 - 1.2 mg/dL Final  . Bilirubin, Direct 03/30/2018 0.1  0.0 - 0.3 mg/dL Final  . Alkaline Phosphatase 03/30/2018 90  39 - 117 U/L Final  . AST 03/30/2018 22  0 - 37 U/L Final  . ALT 03/30/2018 32  0 - 35 U/L Final  . Total Protein 03/30/2018 7.0  6.0 - 8.3 g/dL Final  . Albumin 03/30/2018 4.4  3.5 - 5.2 g/dL Final  . TSH 03/30/2018 2.17  0.35 - 4.50 uIU/mL Final  . WBC 03/30/2018 9.1  4.0 - 10.5 K/uL Final  . RBC 03/30/2018 5.19* 3.87 - 5.11 Mil/uL Final  . Hemoglobin 03/30/2018 15.5* 12.0 - 15.0 g/dL Final  . HCT 03/30/2018 45.4  36.0 - 46.0 % Final  . MCV 03/30/2018 87.6  78.0 - 100.0 fl Final  . MCHC 03/30/2018 34.1  30.0 - 36.0 g/dL Final  . RDW 03/30/2018 12.7  11.5 - 15.5 % Final  . Platelets 03/30/2018  175.0  150.0 - 400.0 K/uL Final  . Neutrophils Relative % 03/30/2018 35.5* 43.0 - 77.0 % Final  . Lymphocytes Relative 03/30/2018 49.3* 12.0 - 46.0 % Final  . Monocytes Relative 03/30/2018 8.4  3.0 - 12.0 % Final  . Eosinophils Relative 03/30/2018 5.6* 0.0 - 5.0 % Final  . Basophils Relative 03/30/2018 1.2  0.0 - 3.0 % Final  . Neutro Abs 03/30/2018 3.2  1.4 - 7.7 K/uL Final  . Lymphs Abs 03/30/2018 4.5* 0.7 - 4.0 K/uL Final  . Monocytes Absolute 03/30/2018 0.8  0.1 - 1.0 K/uL Final  . Eosinophils Absolute 03/30/2018 0.5  0.0 - 0.7 K/uL Final  . Basophils Absolute 03/30/2018 0.1  0.0 - 0.1 K/uL Final    (  this displays the last labs from the last 3 days)  No results found for: TOTALPROTELP, ALBUMINELP, A1GS, A2GS, BETS, BETA2SER, GAMS, MSPIKE, SPEI (this displays SPEP labs)  No results found for: KPAFRELGTCHN, LAMBDASER, KAPLAMBRATIO (kappa/lambda light chains)  No results found for: HGBA, HGBA2QUANT, HGBFQUANT, HGBSQUAN (Hemoglobinopathy evaluation)   No results found for: LDH  No results found for: IRON, TIBC, IRONPCTSAT (Iron and TIBC)  No results found for: FERRITIN  Urinalysis No results found for: COLORURINE, APPEARANCEUR, LABSPEC, PHURINE, GLUCOSEU, HGBUR, BILIRUBINUR, KETONESUR, PROTEINUR, UROBILINOGEN, NITRITE, LEUKOCYTESUR   STUDIES: DEXA scan 06/10/2016 reportedly normal  ELIGIBLE FOR AVAILABLE RESEARCH PROTOCOL: no  ASSESSMENT: 62 y.o. [place] woman status post left lumpectomy 09/05/2015 for atypical ductal hyperplasia  1. Anastrozole started in 07/2017  (a) bone density at lumbar 06/10/2016 reported as "normal".  2. Biopsy on 01/09/2018 consistent with a complex sclerosing lesion in the right upper outer quadrant  (a) right lumpectomy 02/05/2018 shows only the complex sclerosing lesion, no evidence of malignancy  PLAN:  Alexis Wood is tolerating anastrozole well and the plan will be to continue that a total of 5 years.  We discussed her  nighttime hot flashes and in particular I suggested she could try gabapentin.  We discussed the possible toxicity side effects and complications of this agent.  At this point she would prefer to wait and see if things improve on their own  She is being scheduled for repeat breast MRI April 2020 but she tells me her insurance has changed and they may not pay for it.  She will call to see what her out-of-pocket cost will be and she may cancel the test if it is too much.  If so she will let me know  I commended her on starting Chantix and stopping smoking.  That is probably the most important single thing for her health at this point  She will see me again in 1 year.  She knows to call for any other issues that may develop before the next visit here.  Gustav C Magrinat MD   07/27/18 10:19 AM Medical Oncology and Hematology [facility] [address] [place], [place] [address] Tel. [phone]    Fax. [phone]   ADDENDUM: Alexis Wood has another breast lesion which will need excision.  Hopefully this will again be benign.  I am concerned that this developed even while she is taking anastrozole.  I had expect that this would have suppressed changes in her breast for at least some time.  At any rate she would like to postpone the surgery until May to coincide with the holiday the last Monday in May.  If she has her surgery may 24th she will have 3 days postop before she has to return to work.  That would allow her not to have to take any additional time off.  I see no problems with that brief delay and alerted Dr. Cornett regarding her wishes.  We will see me again in 6 months or so.  She knows to call for any other issues that may develop before then.  I personally saw this patient and performed a substantive portion of this encounter with the listed APP documented above.   Magrinat, Gustav C, MD  07/27/18 10:19 AM Medical Oncology and Hematology [facility] [address] [place], [place] [address] Tel. [phone]    Fax. [phone]    I, Mari Johnson, am acting as scribe for Dr. Gustav C. Magrinat.  I, Gustav   Wyatt Galvan MD, have reviewed the above documentation for accuracy and completeness, and I agree with the above.

## 2018-07-27 ENCOUNTER — Telehealth: Payer: Self-pay | Admitting: Oncology

## 2018-07-27 ENCOUNTER — Inpatient Hospital Stay: Payer: 59 | Attending: Oncology | Admitting: Oncology

## 2018-07-27 ENCOUNTER — Ambulatory Visit: Payer: 59 | Admitting: Oncology

## 2018-07-27 VITALS — BP 149/95 | HR 95 | Temp 97.7°F | Resp 18 | Ht 63.0 in | Wt 173.6 lb

## 2018-07-27 DIAGNOSIS — Z79811 Long term (current) use of aromatase inhibitors: Secondary | ICD-10-CM | POA: Diagnosis not present

## 2018-07-27 DIAGNOSIS — E039 Hypothyroidism, unspecified: Secondary | ICD-10-CM | POA: Insufficient documentation

## 2018-07-27 DIAGNOSIS — N6310 Unspecified lump in the right breast, unspecified quadrant: Secondary | ICD-10-CM | POA: Insufficient documentation

## 2018-07-27 DIAGNOSIS — N6092 Unspecified benign mammary dysplasia of left breast: Secondary | ICD-10-CM

## 2018-07-27 MED ORDER — ANASTROZOLE 1 MG PO TABS: 1.0000 mg | ORAL_TABLET | Freq: Every day | ORAL | 4 refills | Status: DC

## 2018-07-27 NOTE — Telephone Encounter (Signed)
Printed calendar and avs. °

## 2018-08-03 ENCOUNTER — Ambulatory Visit: Payer: 59 | Admitting: Oncology

## 2018-08-11 DIAGNOSIS — H25812 Combined forms of age-related cataract, left eye: Secondary | ICD-10-CM | POA: Diagnosis not present

## 2018-08-11 DIAGNOSIS — H2512 Age-related nuclear cataract, left eye: Secondary | ICD-10-CM | POA: Diagnosis not present

## 2018-08-11 DIAGNOSIS — H25012 Cortical age-related cataract, left eye: Secondary | ICD-10-CM | POA: Diagnosis not present

## 2018-08-14 ENCOUNTER — Other Ambulatory Visit: Payer: Self-pay | Admitting: Oncology

## 2018-08-17 ENCOUNTER — Other Ambulatory Visit: Payer: Self-pay | Admitting: Family Medicine

## 2018-08-17 NOTE — Telephone Encounter (Signed)
Last OV 03/30/18 Alprazolam last filled 07/08/18 #60 with 0

## 2018-09-17 ENCOUNTER — Other Ambulatory Visit: Payer: Self-pay | Admitting: Family Medicine

## 2018-10-06 DIAGNOSIS — H25811 Combined forms of age-related cataract, right eye: Secondary | ICD-10-CM | POA: Diagnosis not present

## 2018-10-06 DIAGNOSIS — H2511 Age-related nuclear cataract, right eye: Secondary | ICD-10-CM | POA: Diagnosis not present

## 2018-10-06 DIAGNOSIS — H25011 Cortical age-related cataract, right eye: Secondary | ICD-10-CM | POA: Diagnosis not present

## 2018-10-13 ENCOUNTER — Encounter: Payer: Self-pay | Admitting: Family Medicine

## 2018-10-13 ENCOUNTER — Ambulatory Visit (INDEPENDENT_AMBULATORY_CARE_PROVIDER_SITE_OTHER): Payer: 59 | Admitting: Family Medicine

## 2018-10-13 ENCOUNTER — Other Ambulatory Visit: Payer: Self-pay

## 2018-10-13 VITALS — BP 123/81 | HR 69 | Temp 98.8°F | Resp 16 | Ht 63.0 in | Wt 167.0 lb

## 2018-10-13 DIAGNOSIS — Z0000 Encounter for general adult medical examination without abnormal findings: Secondary | ICD-10-CM

## 2018-10-13 DIAGNOSIS — E663 Overweight: Secondary | ICD-10-CM

## 2018-10-13 DIAGNOSIS — Z1211 Encounter for screening for malignant neoplasm of colon: Secondary | ICD-10-CM | POA: Diagnosis not present

## 2018-10-13 DIAGNOSIS — E559 Vitamin D deficiency, unspecified: Secondary | ICD-10-CM

## 2018-10-13 DIAGNOSIS — F411 Generalized anxiety disorder: Secondary | ICD-10-CM

## 2018-10-13 DIAGNOSIS — I10 Essential (primary) hypertension: Secondary | ICD-10-CM | POA: Diagnosis not present

## 2018-10-13 DIAGNOSIS — Z23 Encounter for immunization: Secondary | ICD-10-CM

## 2018-10-13 LAB — BASIC METABOLIC PANEL
BUN: 16 mg/dL (ref 6–23)
CALCIUM: 10 mg/dL (ref 8.4–10.5)
CO2: 27 meq/L (ref 19–32)
Chloride: 102 mEq/L (ref 96–112)
Creatinine, Ser: 0.88 mg/dL (ref 0.40–1.20)
GFR: 65.04 mL/min (ref >60.00)
Glucose, Bld: 94 mg/dL (ref 70–99)
Potassium: 4.6 mEq/L (ref 3.5–5.1)
SODIUM: 140 meq/L (ref 135–145)

## 2018-10-13 LAB — CBC WITH DIFFERENTIAL/PLATELET
BASOS PCT: 0.6 % (ref 0.0–3.0)
Basophils Absolute: 0.1 10*3/uL (ref 0.0–0.1)
EOS PCT: 3.3 % (ref 0.0–5.0)
Eosinophils Absolute: 0.3 10*3/uL (ref 0.0–0.7)
HCT: 46.6 % — ABNORMAL HIGH (ref 36.0–46.0)
Hemoglobin: 15.6 g/dL — ABNORMAL HIGH (ref 12.0–15.0)
LYMPHS ABS: 4.4 10*3/uL — AB (ref 0.7–4.0)
Lymphocytes Relative: 41.5 % (ref 12.0–46.0)
MCHC: 33.3 g/dL (ref 30.0–36.0)
MCV: 87.8 fl (ref 78.0–100.0)
MONOS PCT: 4.2 % (ref 3.0–12.0)
Monocytes Absolute: 0.4 10*3/uL (ref 0.1–1.0)
Neutro Abs: 5.4 10*3/uL (ref 1.4–7.7)
Neutrophils Relative %: 50.4 % (ref 43.0–77.0)
Platelets: 200 10*3/uL (ref 150.0–400.0)
RBC: 5.31 Mil/uL — AB (ref 3.87–5.11)
RDW: 12.5 % (ref 11.5–15.5)
WBC: 10.7 10*3/uL — ABNORMAL HIGH (ref 4.0–10.5)

## 2018-10-13 LAB — LIPID PANEL
Cholesterol: 151 mg/dL (ref 0–200)
HDL: 59.7 mg/dL (ref >39.00)
LDL CALC: 66 mg/dL (ref 0–99)
NONHDL: 91.2
Total CHOL/HDL Ratio: 3
Triglycerides: 126 mg/dL (ref 0.0–149.0)
VLDL: 25.2 mg/dL (ref 0.0–40.0)

## 2018-10-13 LAB — HEPATIC FUNCTION PANEL
ALT: 38 U/L — ABNORMAL HIGH (ref 0–35)
AST: 22 U/L (ref 0–37)
Albumin: 4.6 g/dL (ref 3.5–5.2)
Alkaline Phosphatase: 100 U/L (ref 39–117)
BILIRUBIN DIRECT: 0.1 mg/dL (ref 0.0–0.3)
BILIRUBIN TOTAL: 0.5 mg/dL (ref 0.2–1.2)
Total Protein: 7.1 g/dL (ref 6.0–8.3)

## 2018-10-13 LAB — TSH: TSH: 0.31 u[IU]/mL — AB (ref 0.35–4.50)

## 2018-10-13 LAB — VITAMIN D 25 HYDROXY (VIT D DEFICIENCY, FRACTURES): VITD: 50.54 ng/mL (ref 30.00–100.00)

## 2018-10-13 MED ORDER — BUPROPION HCL ER (XL) 150 MG PO TB24: 150.0000 mg | ORAL_TABLET | Freq: Every day | ORAL | 3 refills | Status: DC

## 2018-10-13 MED ORDER — ALPRAZOLAM 1 MG PO TABS: 1.0000 mg | ORAL_TABLET | Freq: Two times a day (BID) | ORAL | 3 refills | Status: DC | PRN

## 2018-10-13 NOTE — Assessment & Plan Note (Signed)
Pt's PE WNL w/ exception of being overweight.  Due for repeat colonoscopy- referral placed.  UTD on GYN.  Tdap given.  Check labs.  Anticipatory guidance provided.

## 2018-10-13 NOTE — Assessment & Plan Note (Signed)
Chronic problem.  Adequate control.  Asymptomatic.  Check labs.  No anticipated med changes.  Will follow. 

## 2018-10-13 NOTE — Assessment & Plan Note (Signed)
Discussed need for low carb diet and regular exercise.  Check labs to risk stratify.  Will follow.

## 2018-10-13 NOTE — Assessment & Plan Note (Signed)
Pt would like to attempt weaning of Wellbutrin.  Will decrease Wellbutrin to 150mg  and monitor for sxs recurrence

## 2018-10-13 NOTE — Assessment & Plan Note (Signed)
Pt has hx of this.  Check labs and replete prn. 

## 2018-10-13 NOTE — Progress Notes (Signed)
   Subjective:    Patient ID: Alexis Wood, female    DOB: 31-Aug-1956, 63 y.o.   MRN: [id number]  HPI CPE- UTD on pap, mammo.  Due for Tdap.  Pt needs recall colonoscopy- [facility].   Review of Systems Patient reports no vision/ hearing changes, adenopathy,fever, weight change,  persistant/recurrent hoarseness , swallowing issues, chest pain, palpitations, edema, persistant/recurrent cough, hemoptysis, dyspnea (rest/exertional/paroxysmal nocturnal), gastrointestinal bleeding (melena, rectal bleeding), abdominal pain, significant heartburn, bowel changes, GU symptoms (dysuria, hematuria, incontinence), Gyn symptoms (abnormal  bleeding, pain),  syncope, focal weakness, memory loss, numbness & tingling, skin/hair/nail changes, abnormal bruising or bleeding, anxiety, or depression.     Objective:   Physical Exam General Appearance:    Alert, cooperative, no distress, appears stated age  Head:    Normocephalic, without obvious abnormality, atraumatic  Eyes:    PERRL, conjunctiva/corneas clear, EOM's intact, fundi    benign, both eyes  Ears:    Normal TM's and external ear canals, both ears  Nose:   Nares normal, septum midline, mucosa normal, no drainage    or sinus tenderness  Throat:   Lips, mucosa, and tongue normal; teeth and gums normal  Neck:   Supple, symmetrical, trachea midline, no adenopathy;    Thyroid: no enlargement/tenderness/nodules  Back:     Symmetric, no curvature, ROM normal, no CVA tenderness  Lungs:     Clear to auscultation bilaterally, respirations unlabored  Chest Wall:    No tenderness or deformity   Heart:    Regular rate and rhythm, S1 and S2 normal, no murmur, rub   or gallop  Breast Exam:    Deferred to GYN  Abdomen:     Soft, non-tender, bowel sounds active all four quadrants,    no masses, no organomegaly  Genitalia:    Deferred to GYN  Rectal:    Extremities:   Extremities normal, atraumatic, no cyanosis or edema  Pulses:   2+ and symmetric all extremities   Skin:   Skin color, texture, turgor normal, no rashes or lesions  Lymph nodes:   Cervical, supraclavicular, and axillary nodes normal  Neurologic:   CNII-XII intact, normal strength, sensation and reflexes    throughout          Assessment & Plan:

## 2018-10-13 NOTE — Patient Instructions (Signed)
Follow up in 4-6 weeks to recheck anxiety We'll notify you of your lab results and make any changes if needed We'll call you with your GI appt for the repeat colonoscopy Eat a healthy, low carb diet and get regular exercise DECREASE the Wellbutrin to 150mg  daily and monitor how you feel so we can discuss Call with any questions or concerns Happy New Year!!

## 2018-10-15 ENCOUNTER — Other Ambulatory Visit: Payer: Self-pay | Admitting: General Practice

## 2018-10-15 ENCOUNTER — Telehealth: Payer: Self-pay | Admitting: Family Medicine

## 2018-10-15 DIAGNOSIS — E039 Hypothyroidism, unspecified: Secondary | ICD-10-CM

## 2018-10-15 MED ORDER — LEVOTHYROXINE SODIUM 100 MCG PO TABS: 100.0000 ug | ORAL_TABLET | Freq: Every day | ORAL | 3 refills | Status: DC

## 2018-10-15 NOTE — Telephone Encounter (Signed)
Pt. Given results and instructions. Appointment made for return lab work. Please send pt.'s new dose to [facility].

## 2018-10-15 NOTE — Telephone Encounter (Signed)
Copied from CRM [id number]. Topic: Quick Communication - See Telephone Encounter >> Oct 15, 2018  8:08 AM Windy Kalata, NT wrote: CRM for notification. See Telephone encounter for: 10/15/18.  Patient returning call for lab results.

## 2018-10-15 NOTE — Telephone Encounter (Signed)
Medication filled to pharmacy as requested.   

## 2018-10-15 NOTE — Telephone Encounter (Signed)
Message left for pt. To call back for lab results. 

## 2018-10-27 DIAGNOSIS — H353123 Nonexudative age-related macular degeneration, left eye, advanced atrophic without subfoveal involvement: Secondary | ICD-10-CM | POA: Diagnosis not present

## 2018-10-27 DIAGNOSIS — H353222 Exudative age-related macular degeneration, left eye, with inactive choroidal neovascularization: Secondary | ICD-10-CM | POA: Diagnosis not present

## 2018-10-27 DIAGNOSIS — H43812 Vitreous degeneration, left eye: Secondary | ICD-10-CM | POA: Diagnosis not present

## 2018-10-29 ENCOUNTER — Telehealth: Payer: Self-pay

## 2018-10-29 NOTE — Telephone Encounter (Signed)
Copied from CRM [id number]. Topic: General - Other >> Oct 29, 2018  8:10 AM Elliot Gault wrote: Relation to pt: self  Call back number: [phone]    Reason for call:  Patient states she has macular degeneration and would like PCP input on taking saffron 20 mg due to reading it can conflict with her blood pressure medication she's currently taking. Patient consulted with her ophthalmologist and specialist brushed it off.

## 2018-10-30 NOTE — Telephone Encounter (Signed)
Pt called back in to be advised by her PCP.   Please assist.

## 2018-10-31 ENCOUNTER — Other Ambulatory Visit: Payer: Self-pay | Admitting: Family Medicine

## 2018-11-02 NOTE — Telephone Encounter (Signed)
Thankfully the BP medication she is on is not one that is impacted by saffron (according to the research I have done).  I think it is safe to proceed w/ the supplement

## 2018-11-02 NOTE — Telephone Encounter (Signed)
Patient notified of PCP recommendations and is agreement and expresses an understanding.   Ok for PEC to Discuss results / PCP recommendations / Schedule patient.   

## 2018-11-09 ENCOUNTER — Other Ambulatory Visit: Payer: Self-pay | Admitting: Oncology

## 2018-11-11 ENCOUNTER — Other Ambulatory Visit (INDEPENDENT_AMBULATORY_CARE_PROVIDER_SITE_OTHER): Payer: 59

## 2018-11-11 DIAGNOSIS — E039 Hypothyroidism, unspecified: Secondary | ICD-10-CM | POA: Diagnosis not present

## 2018-11-12 LAB — TSH: TSH: 1.75 u[IU]/mL (ref 0.35–4.50)

## 2018-11-23 ENCOUNTER — Ambulatory Visit: Payer: 59 | Admitting: Family Medicine

## 2018-11-23 ENCOUNTER — Other Ambulatory Visit: Payer: Self-pay

## 2018-11-23 ENCOUNTER — Encounter: Payer: Self-pay | Admitting: Family Medicine

## 2018-11-23 VITALS — BP 124/81 | HR 76 | Temp 98.0°F | Resp 16 | Ht 63.0 in | Wt 169.0 lb

## 2018-11-23 DIAGNOSIS — Z0000 Encounter for general adult medical examination without abnormal findings: Secondary | ICD-10-CM | POA: Diagnosis not present

## 2018-11-23 DIAGNOSIS — F411 Generalized anxiety disorder: Secondary | ICD-10-CM

## 2018-11-23 DIAGNOSIS — Z23 Encounter for immunization: Secondary | ICD-10-CM | POA: Diagnosis not present

## 2018-11-23 DIAGNOSIS — I10 Essential (primary) hypertension: Secondary | ICD-10-CM | POA: Diagnosis not present

## 2018-11-23 DIAGNOSIS — E559 Vitamin D deficiency, unspecified: Secondary | ICD-10-CM | POA: Diagnosis not present

## 2018-11-23 MED ORDER — BUPROPION HCL ER (XL) 300 MG PO TB24: 300.0000 mg | ORAL_TABLET | Freq: Every day | ORAL | 1 refills | Status: DC

## 2018-11-23 NOTE — Progress Notes (Signed)
   Subjective:    Patient ID: Alexis Wood, female    DOB: Jul 10, 1956, 63 y.o.   MRN: [id number]  HPI Anxiety- pt had wanted to wean her Wellbutrin to 150mg  at last visit.  She had done this successfully for awhile but then w/ stressors of selling parents' home, she found that she needed to go back up on medication.  Has been taking left over 300mg  daily.  Since increasing medication, sxs have been better controlled.     Review of Systems For ROS see HPI     Objective:   Physical Exam Vitals signs reviewed.  Constitutional:      General: She is not in acute distress.    Appearance: Normal appearance. She is not ill-appearing.  HENT:     Head: Normocephalic and atraumatic.  Neurological:     General: No focal deficit present.     Mental Status: She is alert and oriented to person, place, and time.  Psychiatric:        Mood and Affect: Mood normal.        Behavior: Behavior normal.        Thought Content: Thought content normal.           Assessment & Plan:

## 2018-11-23 NOTE — Assessment & Plan Note (Signed)
Deteriorated when Wellbutrin was decreased to 150mg  but pt was able to increase back to 300mg  by taking 2 and this improved her sxs.  Restart 300mg  tabs daily.  Pt expressed understanding and is in agreement w/ plan.

## 2018-11-23 NOTE — Patient Instructions (Addendum)
Follow up as needed or in late July to recheck BP Continue the Wellbutrin at 300mg  daily Keep up the good work!  You look great! Call with any questions or concerns Happy Spring!!!

## 2018-11-23 NOTE — Addendum Note (Signed)
Addended by: Geannie Risen on: 11/23/2018 10:29 AM   Modules accepted: Orders

## 2018-12-17 ENCOUNTER — Other Ambulatory Visit: Payer: Self-pay | Admitting: Oncology

## 2018-12-17 DIAGNOSIS — Z1231 Encounter for screening mammogram for malignant neoplasm of breast: Secondary | ICD-10-CM

## 2019-01-08 ENCOUNTER — Telehealth: Payer: Self-pay | Admitting: *Deleted

## 2019-01-08 ENCOUNTER — Other Ambulatory Visit: Payer: Self-pay | Admitting: Family Medicine

## 2019-01-08 MED ORDER — ALPRAZOLAM 1 MG PO TABS: 1.0000 mg | ORAL_TABLET | Freq: Two times a day (BID) | ORAL | 3 refills | Status: DC | PRN

## 2019-01-08 NOTE — Addendum Note (Signed)
Addended by: Sheliah Hatch on: 01/08/2019 02:24 PM   Modules accepted: Orders

## 2019-01-08 NOTE — Telephone Encounter (Signed)
Received request from [facility] to send a new RX for alprazolam.   Patient had this filled at [facility] in January and would have 1 refill left - however she does not use that pharmacy anymore and [facility] cannot retrieve the refills from [facility].  Patient is asking that we send RX to [facility].

## 2019-01-08 NOTE — Telephone Encounter (Signed)
Prescription sent

## 2019-01-11 ENCOUNTER — Other Ambulatory Visit: Payer: Self-pay | Admitting: General Practice

## 2019-01-11 NOTE — Telephone Encounter (Signed)
Disregard, alprazolam last filled 01/08/19 #60 with 3

## 2019-01-31 ENCOUNTER — Other Ambulatory Visit: Payer: Self-pay | Admitting: Family Medicine

## 2019-02-01 ENCOUNTER — Other Ambulatory Visit: Payer: Self-pay | Admitting: Family Medicine

## 2019-02-10 ENCOUNTER — Other Ambulatory Visit: Payer: Self-pay | Admitting: Family Medicine

## 2019-02-17 ENCOUNTER — Telehealth: Payer: Self-pay

## 2019-02-17 ENCOUNTER — Other Ambulatory Visit: Payer: Self-pay

## 2019-02-17 ENCOUNTER — Ambulatory Visit
Admission: RE | Admit: 2019-02-17 | Discharge: 2019-02-17 | Disposition: A | Discharge status: Home / Self Care | Payer: 59 | Source: Ambulatory Visit | Attending: Oncology | Admitting: Oncology

## 2019-02-17 DIAGNOSIS — N6092 Unspecified benign mammary dysplasia of left breast: Secondary | ICD-10-CM

## 2019-02-17 MED ORDER — GADOBUTROL 1 MMOL/ML IV SOLN
7.0000 mL | Freq: Once | INTRAVENOUS | Status: AC | PRN
  Administered 2019-02-17: 7 mL via INTRAVENOUS

## 2019-02-17 NOTE — Telephone Encounter (Signed)
Attempted to call patient x 1.  Unable to leave message at this time.

## 2019-02-17 NOTE — Telephone Encounter (Signed)
Patient returned call to nurse.  Normal MRI results showing no cancer were given to patient.  She expressed thanks and understanding for call.

## 2019-02-17 NOTE — Telephone Encounter (Signed)
-----   Message from Loa Socks, NP sent at 02/17/2019  1:02 PM EDT ----- MRI normal, no cancer, please notify patient ----- Message ----- From: Interface, Rad Results In Sent: 02/17/2019  12:45 PM EDT To: Loa Socks, NP

## 2019-03-26 ENCOUNTER — Encounter: Payer: Self-pay | Admitting: Family Medicine

## 2019-03-26 ENCOUNTER — Other Ambulatory Visit: Payer: Self-pay

## 2019-03-26 ENCOUNTER — Ambulatory Visit (INDEPENDENT_AMBULATORY_CARE_PROVIDER_SITE_OTHER): Payer: 59 | Admitting: Family Medicine

## 2019-03-26 DIAGNOSIS — F411 Generalized anxiety disorder: Secondary | ICD-10-CM

## 2019-03-26 DIAGNOSIS — F329 Major depressive disorder, single episode, unspecified: Secondary | ICD-10-CM | POA: Diagnosis not present

## 2019-03-26 DIAGNOSIS — F419 Anxiety disorder, unspecified: Secondary | ICD-10-CM

## 2019-03-26 MED ORDER — SERTRALINE HCL 25 MG PO TABS: 25.0000 mg | ORAL_TABLET | Freq: Every day | ORAL | 3 refills | Status: DC

## 2019-03-26 NOTE — Progress Notes (Signed)
I have discussed the procedure for the virtual visit with the patient who has given consent to proceed with assessment and treatment.   Unable to obtain vitals  Jessica L Brodmerkel, CMA     

## 2019-03-26 NOTE — Progress Notes (Signed)
Virtual Visit via Video   I connected with patient on 03/26/19 at  2:00 PM EDT by a video enabled telemedicine application and verified that I am speaking with the correct person using two identifiers.  Location patient: Home Location provider: [facility] Persons participating in the virtual visit: Patient, Provider, CMA (Jess B)  I discussed the limitations of evaluation and management by telemedicine and the availability of in person appointments. The patient expressed understanding and agreed to proceed.  Interactive audio and video telecommunications were attempted between this provider and patient, however failed, due to patient having technical difficulties OR patient did not have access to video capability.  We continued and completed visit with audio only.   Subjective:   HPI:   Anxiety/Depression- chronic problem, currently on Wellbutrin 300mg  daily and Alprazolam prn.  Pt finds herself crying at [employer].  Cries when she sees cardinals in the yard.  'I just miss my parents'.  Daughter will be 3030 and 'she won't help us with anything'.  'it's just breaking my heart'.  Pt has been in counseling w/ Terri.  'I'm just an emotional wreck'.  ROS:   See pertinent positives and negatives per HPI.  Patient Active Problem List   Diagnosis Date Noted  . Breast mass, right 12/29/2017  . Left Achilles tendinitis 10/20/2017  . Vitamin D deficiency 10/06/2017  . 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 Allergic rhinitis due to allergen 11/04/2011  . General medical examination 03/26/2011  . Hyperlipidemia 10/01/2010  . Overweight (BMI 25.0-29.9) 10/01/2010  . MACULAR DEGENERATION 03/09/2010  . Hypothyroidism 02/13/2010  . Anxiety state 02/13/2010  . TOBACCO ABUSE 02/13/2010  . CARPAL TUNNEL SYNDROME, RIGHT 02/13/2010  . Essential hypertension 02/13/2010  . OSTEOARTHRITIS 02/13/2010    Social History   Tobacco Use  . Smoking status:  Current Every Day Smoker    Packs/day: 0.50    Types: Cigarettes  . Smokeless tobacco: Never Used  Substance Use Topics  . Alcohol use: No    Current Outpatient Medications:  .  ALPRAZolam (XANAX) 1 MG tablet, Take 1 tablet (1 mg total) by mouth 2 (two) times daily as needed. for anxiety, Disp: 60 tablet, Rfl: 3 .  anastrozole (ARIMIDEX) 1 MG tablet, TAKE 1 TABLET BY MOUTH DAILY, Disp: 30 tablet, Rfl: 1 .  buPROPion (WELLBUTRIN XL) 300 MG 24 hr tablet, Take 1 tablet (300 mg total) by mouth daily., Disp: 90 tablet, Rfl: 1 .  ibuprofen (ADVIL,MOTRIN) 800 MG tablet, Take 1 tablet (800 mg total) by mouth every 8 (eight) hours as needed., Disp: 30 tablet, Rfl: 0 .  levothyroxine (SYNTHROID) 100 MCG tablet, TAKE 1 TABLET BY MOUTH DAILY, Disp: 90 tablet, Rfl: 1 .  losartan (COZAAR) 25 MG tablet, TAKE 2 TABLETS BY MOUTH EVERY DAY, Disp: 180 tablet, Rfl: 0 .  Multiple Vitamins-Minerals (PRESERVISION AREDS 2) CAPS, Take by mouth., Disp: , Rfl:  .  Vitamin D, Ergocalciferol, 2000 units CAPS, Take by mouth., Disp: , Rfl:   Allergies  Allergen Reactions  . Penicillins Hives  . Pristiq [Desvenlafaxine] Hives    Objective:   LMP 03/09/2004  Pt is able to speak clearly, coherently without shortness of breath or increased work of breathing.  Thought process is linear.  Mood is appropriate.   Assessment and Plan:   Anxiety/depression- deteriorated.  Pt is having a very difficult time with the loss of her parents, the strained relationship w/ her daughter, and COVID.  Will add Sertraline to  current Wellbutrin and Alprazolam and monitor closely for improvement.  Encouraged her to restart counseling which can be done virtually.  Pt expressed understanding and is in agreement w/ plan.    Annye Asa, MD 03/26/2019

## 2019-03-27 ENCOUNTER — Other Ambulatory Visit: Payer: Self-pay | Admitting: Family Medicine

## 2019-03-29 NOTE — Telephone Encounter (Signed)
Please advise, per pharmacy notes this is on back order, alternative is needed.

## 2019-03-29 NOTE — Telephone Encounter (Signed)
Spoke with pharmacy. Pt was actually on losartan 50mg . And they are asking if it is ok to fill the benicar 20mg  in its place

## 2019-03-29 NOTE — Telephone Encounter (Signed)
Please ask pharmacy if they have Losartan 50mg  daily available

## 2019-04-06 ENCOUNTER — Other Ambulatory Visit: Payer: Self-pay | Admitting: Family Medicine

## 2019-04-16 ENCOUNTER — Encounter: Payer: Self-pay | Admitting: Family Medicine

## 2019-04-16 ENCOUNTER — Ambulatory Visit: Payer: 59 | Admitting: Family Medicine

## 2019-04-16 ENCOUNTER — Other Ambulatory Visit: Payer: Self-pay

## 2019-04-16 VITALS — BP 131/83 | HR 90 | Temp 98.0°F | Resp 16 | Ht 63.0 in | Wt 169.4 lb

## 2019-04-16 DIAGNOSIS — F32A Depression, unspecified: Secondary | ICD-10-CM | POA: Insufficient documentation

## 2019-04-16 DIAGNOSIS — M7711 Lateral epicondylitis, right elbow: Secondary | ICD-10-CM

## 2019-04-16 DIAGNOSIS — I10 Essential (primary) hypertension: Secondary | ICD-10-CM | POA: Diagnosis not present

## 2019-04-16 DIAGNOSIS — E039 Hypothyroidism, unspecified: Secondary | ICD-10-CM

## 2019-04-16 DIAGNOSIS — F329 Major depressive disorder, single episode, unspecified: Secondary | ICD-10-CM

## 2019-04-16 DIAGNOSIS — F419 Anxiety disorder, unspecified: Secondary | ICD-10-CM

## 2019-04-16 DIAGNOSIS — E663 Overweight: Secondary | ICD-10-CM

## 2019-04-16 LAB — CBC WITH DIFFERENTIAL/PLATELET
Basophils Absolute: 0.1 10*3/uL (ref 0.0–0.1)
Basophils Relative: 0.9 % (ref 0.0–3.0)
Eosinophils Absolute: 0.3 10*3/uL (ref 0.0–0.7)
Eosinophils Relative: 2.7 % (ref 0.0–5.0)
HCT: 43.2 % (ref 36.0–46.0)
Hemoglobin: 14.6 g/dL (ref 12.0–15.0)
Lymphocytes Relative: 40.4 % (ref 12.0–46.0)
Lymphs Abs: 4.4 10*3/uL — ABNORMAL HIGH (ref 0.7–4.0)
MCHC: 33.8 g/dL (ref 30.0–36.0)
MCV: 88.7 fl (ref 78.0–100.0)
Monocytes Absolute: 0.5 10*3/uL (ref 0.1–1.0)
Monocytes Relative: 4.1 % (ref 3.0–12.0)
Neutro Abs: 5.7 10*3/uL (ref 1.4–7.7)
Neutrophils Relative %: 51.9 % (ref 43.0–77.0)
Platelets: 194 10*3/uL (ref 150.0–400.0)
RBC: 4.87 Mil/uL (ref 3.87–5.11)
RDW: 12.7 % (ref 11.5–15.5)
WBC: 10.9 10*3/uL — ABNORMAL HIGH (ref 4.0–10.5)

## 2019-04-16 LAB — BASIC METABOLIC PANEL
BUN: 19 mg/dL (ref 6–23)
CO2: 26 mEq/L (ref 19–32)
Calcium: 9.1 mg/dL (ref 8.4–10.5)
Chloride: 103 mEq/L (ref 96–112)
Creatinine, Ser: 0.91 mg/dL (ref 0.40–1.20)
GFR: 62.47 mL/min (ref >60.00)
Glucose, Bld: 105 mg/dL — ABNORMAL HIGH (ref 70–99)
Potassium: 4.3 mEq/L (ref 3.5–5.1)
Sodium: 138 mEq/L (ref 135–145)

## 2019-04-16 LAB — LIPID PANEL
Cholesterol: 186 mg/dL (ref 0–200)
HDL: 62.9 mg/dL (ref >39.00)
NonHDL: 123.53
Total CHOL/HDL Ratio: 3
Triglycerides: 226 mg/dL — ABNORMAL HIGH (ref 0.0–149.0)
VLDL: 45.2 mg/dL — ABNORMAL HIGH (ref 0.0–40.0)

## 2019-04-16 LAB — HEPATIC FUNCTION PANEL
ALT: 22 U/L (ref 0–35)
AST: 18 U/L (ref 0–37)
Albumin: 4.5 g/dL (ref 3.5–5.2)
Alkaline Phosphatase: 103 U/L (ref 39–117)
Bilirubin, Direct: 0.1 mg/dL (ref 0.0–0.3)
Total Bilirubin: 0.3 mg/dL (ref 0.2–1.2)
Total Protein: 6.8 g/dL (ref 6.0–8.3)

## 2019-04-16 LAB — LDL CHOLESTEROL, DIRECT: Direct LDL: 107 mg/dL

## 2019-04-16 LAB — TSH: TSH: 3.31 u[IU]/mL (ref 0.35–4.50)

## 2019-04-16 NOTE — Assessment & Plan Note (Signed)
Chronic problem.  Adequate control today.  Asymptomatic.  Check labs.  No anticipated med changes.  Will follow. 

## 2019-04-16 NOTE — Progress Notes (Signed)
   Subjective:    Patient ID: Alexis Wood, female    DOB: June 21, 1956, 63 y.o.   MRN: [id number]  HPI HTN- chronic problem, on Olmesartan 20mg  daily w/ adequate control.  No CP, SOB, HAs, visual changes, edema.  Depression- chronic problem.  Sertraline was added at last visit.  Also on Wellbutrin 300mg  daily.  Pt reports addition of sertraline 'has helped a lot'.  Still has 'moments' but not daily.  Elbow Pain- Rt elbow, was working in the yard.  sxs started ~1 month ago.  Hypothyroid- chronic problem.  On Levothyroxine 169mcg daily.  Denies changes to skin/hair/nails.   Review of Systems For ROS see HPI     Objective:   Physical Exam Vitals signs reviewed.  Constitutional:      General: She is not in acute distress.    Appearance: Normal appearance. She is well-developed. She is obese.  HENT:     Head: Normocephalic and atraumatic.  Eyes:     Conjunctiva/sclera: Conjunctivae normal.     Pupils: Pupils are equal, round, and reactive to light.  Neck:     Musculoskeletal: Normal range of motion and neck supple.     Thyroid: No thyromegaly.  Cardiovascular:     Rate and Rhythm: Normal rate and regular rhythm.     Heart sounds: Normal heart sounds. No murmur.  Pulmonary:     Effort: Pulmonary effort is normal. No respiratory distress.     Breath sounds: Normal breath sounds.  Abdominal:     General: There is no distension.     Palpations: Abdomen is soft.     Tenderness: There is no abdominal tenderness.  Musculoskeletal:        General: Tenderness (TTP over R lateral epicondyle) present.  Lymphadenopathy:     Cervical: No cervical adenopathy.  Skin:    General: Skin is warm and dry.  Neurological:     Mental Status: She is alert and oriented to person, place, and time.  Psychiatric:        Behavior: Behavior normal.           Assessment & Plan:  R lateral epicondylitis- new.  Reviewed dx and supportive care w/ pt.  Pt expressed understanding and is in  agreement w/ plan.

## 2019-04-16 NOTE — Assessment & Plan Note (Signed)
Deteriorated.  Pt's BMI is exactly 30.  Stressed need for healthy diet and regular exercise.  Discussed setting attainable goals and celebrating her accomplishments.  Will continue to follow.

## 2019-04-16 NOTE — Patient Instructions (Addendum)
Schedule your complete physical in 6 months We'll notify you of your lab results and make any changes if needed ICE the elbow Get a counter force strap to take the pressure of that tendon Continue to walk on healthy diet and regular exercise- you can do it! Set 5 lb goals and celebrate when you meet them! Call with any questions or concerns Hang in there!!! Stay Safe!

## 2019-04-16 NOTE — Assessment & Plan Note (Signed)
Stable.  Pt reports addition of sertraline made a big improvement for her.  No med changes at this time.  Will follow.

## 2019-04-16 NOTE — Assessment & Plan Note (Signed)
Chronic problem.  Currently asymptomatic.  Check labs.  Adjust meds prn  

## 2019-05-04 ENCOUNTER — Other Ambulatory Visit: Payer: Self-pay | Admitting: Family Medicine

## 2019-05-11 ENCOUNTER — Other Ambulatory Visit: Payer: Self-pay | Admitting: Obstetrics and Gynecology

## 2019-05-11 DIAGNOSIS — M8580 Other specified disorders of bone density and structure, unspecified site: Secondary | ICD-10-CM

## 2019-06-02 ENCOUNTER — Other Ambulatory Visit: Payer: Self-pay | Admitting: Family Medicine

## 2019-06-14 ENCOUNTER — Other Ambulatory Visit: Payer: Self-pay | Admitting: Family Medicine

## 2019-06-14 NOTE — Telephone Encounter (Signed)
Last OV 04/16/19 Alprazolam last filled 01/08/19 #60 with 3

## 2019-07-12 ENCOUNTER — Other Ambulatory Visit: Payer: Self-pay | Admitting: Family Medicine

## 2019-07-16 ENCOUNTER — Ambulatory Visit
Admission: RE | Admit: 2019-07-16 | Discharge: 2019-07-16 | Disposition: A | Discharge status: Home / Self Care | Payer: 59 | Source: Ambulatory Visit | Attending: Obstetrics and Gynecology | Admitting: Obstetrics and Gynecology

## 2019-07-16 ENCOUNTER — Other Ambulatory Visit: Payer: Self-pay

## 2019-07-16 ENCOUNTER — Ambulatory Visit
Admission: RE | Admit: 2019-07-16 | Discharge: 2019-07-16 | Disposition: A | Discharge status: Home / Self Care | Payer: 59 | Source: Ambulatory Visit | Attending: Oncology | Admitting: Oncology

## 2019-07-16 ENCOUNTER — Ambulatory Visit: Payer: 59

## 2019-07-16 DIAGNOSIS — Z1231 Encounter for screening mammogram for malignant neoplasm of breast: Secondary | ICD-10-CM

## 2019-07-16 DIAGNOSIS — M8580 Other specified disorders of bone density and structure, unspecified site: Secondary | ICD-10-CM

## 2019-07-19 ENCOUNTER — Other Ambulatory Visit: Payer: Self-pay | Admitting: Family Medicine

## 2019-07-20 ENCOUNTER — Telehealth: Payer: Self-pay | Admitting: Family Medicine

## 2019-07-20 NOTE — Telephone Encounter (Signed)
I would encourage her to go through with this as colonoscopies are scheduled for a reason.  And yes, the prep isn't fun but it is worthwhile

## 2019-07-20 NOTE — Telephone Encounter (Signed)
Patient is schedule to have a colonoscopy next Friday and patient is having concerns about having it done. Patient states that she does not want to do this procedure because she already know what she will be going through as far as the prep for it. She  She is requesting a phone call back at [phone]. She will call and cancel the procedure after talking to Dr. Birdie Riddle nurse.

## 2019-07-20 NOTE — Telephone Encounter (Signed)
Please advise 

## 2019-07-20 NOTE — Telephone Encounter (Signed)
Called and advised of PCP recommendations. Pt stats that she will probably proceed with colonoscopy even though she really doesn't want to.

## 2019-07-27 ENCOUNTER — Other Ambulatory Visit: Payer: Self-pay | Admitting: Family Medicine

## 2019-07-27 NOTE — Progress Notes (Signed)
[place]  [phone] [phone]     ID: Alexis Wood DOB: 03/20/1956  MR#: [id number]  IZT#:[id number]  Patient Care Team: Midge Minium, MD as PCP - General (Family Medicine) Arvella Nigh, MD as Consulting Physician (Obstetrics and Gynecology) Erroll Luna, MD as Consulting Physician (General Surgery) Magrinat, Virgie Dad, MD as Consulting Physician (Oncology) OTHER MD:  CHIEF COMPLAINT: Atypical ductal hyperplasia  CURRENT TREATMENT: Anastrozole; intensified screening   INTERVAL HISTORY: Alexis Wood returns today for continuing follow up for high risk of breast cancer.  Alexis Wood continues on Anastrozole.  She has occasional hot flashes.  She has developed lichen sclerosis and Dr. Renold Don is helping her out in that regard.  Otherwise she tolerates the drug quite well.  She obtains it under good price.  Since her last visit, she underwent bilateral breast MRI on 02/17/2019, which showed: breast composition B; no evidence of malignancy.   She also underwent bilateral screening mammography with tomography at [facility] on 07/16/2019 showing: breast density category B; no evidence of malignancy in either breast.  She also underwent bone density testing the same day. This showed a T-score of -0.9, which is considered normal.   REVIEW OF SYSTEMS: Alexis Wood   is scheduled for colonoscopy Friday.  She is fretting about the prep.  She just got a fit bit from her daughter.  She has not put it on yet.  She knows she really does need to exercise more than she is.  She actually is not exercising at all at this point.  They are being careful regarding the pandemic.  She is the one who does the groceries and she usually goes like at 7 in the morning or so.  Aside from these issues a detailed review of systems today was benign.   HISTORY OF CURRENT ILLNESS: From the original intake note:  Alexis Wood had routine screening mammography June 08, 2015  showing a possible abnormality in the left breast.  On 06/19/2015 she underwent left diagnostic mammography with tomography and left breast ultrasonography, showing the breast density to be category C.  In the left breast upper outer quadrant there was a persistent area of distortion, with a second area approximately 3.5 cm medially to the first.  Accordingly on June 29, 2017 she underwent biopsy of the 2 left breast areas in question, but one in the upper inner quadrant and one in the upper outer quadrant, both showing complex sclerosing lesions.  ([id number]).  The patient then met with Dr. Brantley Stage and after appropriate discussion on September 05, 2015 proceeded to left double lumpectomy.  The pathology from this procedure ([id number]) showed in the more lateral lesion a complex Raulerson lesion with atypical ductal hyperplasia.  The more medial lesion showed a complex sclerosing lesion without atypia.  The patient is now referred for discussion of her risk of developing breast cancer in the future  Her subsequent history is as detailed below.   PAST MEDICAL HISTORY: Past Medical History:  Diagnosis Date  . Anxiety   . Arthritis    hands  . Dental crowns present   . Hypertension    states under control with med., has been on med. "several years"  . Hypothyroidism   . Macular degeneration   . Mass of right breast 01/2018    PAST SURGICAL HISTORY: Past Surgical History:  Procedure Laterality Date  . BREAST LUMPECTOMY WITH NEEDLE LOCALIZATION Left 09/05/2015   Procedure: LEFT BREAST WIRE LOCALIZED  LUMPECTOMY TIMES 2;  Surgeon: Erroll Luna, MD;  Location: [facility];  Service: General;  Laterality: Left;  . BREAST LUMPECTOMY WITH RADIOACTIVE SEED LOCALIZATION Right 02/05/2018   Procedure: RIGHT BREAST LUMPECTOMY WITH RADIOACTIVE SEED LOCALIZATION;  Surgeon: Erroll Luna, MD;  Location: [facility];  Service: General;  Laterality: Right;  . cataract  Bilateral   . DILATATION & CURRETTAGE/HYSTEROSCOPY WITH RESECTOCOPE  02/21/2006  . THYROIDECTOMY  Age 70    FAMILY HISTORY Family History  Problem Relation Age of Onset  . Coronary artery disease Mother   . Hypertension Mother   . Dementia Mother   . Coronary artery disease Father   . Hypertension Father   . Diabetes Father   . Breast cancer Maternal Aunt   . Diabetes type I Other   Patient's father passed away in 2013-11-23 at age 102 from a massive heart attack. He had a pacemaker. Patient's mother passed away in 05/23/17 at age 39. She had dementia. The patient only has one sister. Her mother had three sisters and one of them had breast cancer when she was in her 23's.    GYNECOLOGIC HISTORY:  Patient's last menstrual period was 03/09/2004. Menarche at 63 years old. GXP1, First carried to term at 63 years old. Was on birth control pills for about 14 years with no issues. Menopause in her 59's with no hormone replacement.   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 She lives at home with her husband, Clair Gulling, who is retired with disabilities. Her only daughter, Caryl Pina, lives in [place], [place] and does research for [employer].  She is not married.  She is into hoarseness.  The patient has no grandchildren.  The patient is not a Banker.    ADVANCED DIRECTIVES: In the absence of any documents to the contrary the patient's husband is her healthcare power of attorney   HEALTH MAINTENANCE: Social History   Tobacco Use  . Smoking status: Current Every Day Smoker    Packs/day: 0.50    Types: Cigarettes  . Smokeless tobacco: Never Used  Substance Use Topics  . Alcohol use: No  . Drug use: No     Colonoscopy:  PAP:  Bone density: 06/2019, -0.9   Allergies  Allergen Reactions  . Penicillins Hives  . Pristiq [Desvenlafaxine] Hives    Current Outpatient Medications  Medication Sig Dispense Refill  . ALPRAZolam  (XANAX) 1 MG tablet TAKE 1 TABLET (1 MG TOTAL) BY MOUTH 2 (TWO) TIMES DAILY AS NEEDED. FOR ANXIETY 60 tablet 3  . anastrozole (ARIMIDEX) 1 MG tablet TAKE 1 TABLET BY MOUTH EVERY DAY 30 tablet 1  . buPROPion (WELLBUTRIN XL) 300 MG 24 hr tablet TAKE 1 TABLET BY MOUTH DAILY 30 tablet 4  . levothyroxine (SYNTHROID) 100 MCG tablet TAKE 1 TABLET BY MOUTH EVERY DAY 30 tablet 5  . Melatonin 10 MG CAPS Take by mouth.    . Multiple Vitamins-Minerals (PRESERVISION AREDS 2) CAPS Take by mouth.    . olmesartan (BENICAR) 20 MG tablet TAKE 1 TABLET BY MOUTH EVERY DAY 30 tablet 3  . sertraline (ZOLOFT) 25 MG tablet TAKE 1 TABLET BY MOUTH EVERY DAY 30 tablet 3  . Vitamin D, Ergocalciferol, 2000 units CAPS Take by mouth.     No current facility-administered medications for this visit.     OBJECTIVE: Middle-aged white woman who appears well  There were no vitals filed for this visit.   There is no height  or weight on file to calculate BMI.   Wt Readings from Last 3 Encounters:  04/16/19 169 lb 6 oz (76.8 kg)  11/23/18 169 lb (76.7 kg)  10/13/18 167 lb (75.8 kg)      ECOG FS:1 - Symptomatic but completely ambulatory  Sclerae unicteric, EOMs intact Wearing a mask No cervical or supraclavicular adenopathy Lungs no rales or rhonchi Heart regular rate and rhythm Abd soft, nontender, positive bowel sounds MSK no focal spinal tenderness, no upper extremity lymphedema Neuro: nonfocal, well oriented, appropriate affect Breasts: Status post bilateral lumpectomies.  There are no suspicious findings in either breast.  Both axillae are benign.   LAB RESULTS:  Results for Alexis Wood, Alexis Wood (MRN [id number]) as of 07/28/2019 09:29  Ref. Range 02/07/2017 15:42 10/06/2017 09:48 03/30/2018 16:32 10/13/2018 10:34 04/16/2019 10:47  Lymphocyte # Latest Ref Range: 0.7 - 4.0 K/uL 3,888 3.5 4.5 (H) 4.4 (H) 4.4 (H)    CMP     Component Value Date/Time   NA 138 04/16/2019 1047   K 4.3 04/16/2019 1047   CL 103 04/16/2019  1047   CO2 26 04/16/2019 1047   GLUCOSE 105 (H) 04/16/2019 1047   GLUCOSE 66 10/12/2009   BUN 19 04/16/2019 1047   CREATININE 0.91 04/16/2019 1047   CREATININE 0.94 02/07/2017 1542   CALCIUM 9.1 04/16/2019 1047   PROT 6.8 04/16/2019 1047   ALBUMIN 4.5 04/16/2019 1047   AST 18 04/16/2019 1047   ALT 22 04/16/2019 1047   ALKPHOS 103 04/16/2019 1047   BILITOT 0.3 04/16/2019 1047   GFRNONAA 76.20 03/30/2010 0000    No results found for: TOTALPROTELP, ALBUMINELP, A1GS, A2GS, BETS, BETA2SER, GAMS, MSPIKE, SPEI  No results found for: KPAFRELGTCHN, LAMBDASER, KAPLAMBRATIO  Lab Results  Component Value Date   WBC 10.9 (H) 04/16/2019   NEUTROABS 5.7 04/16/2019   HGB 14.6 04/16/2019   HCT 43.2 04/16/2019   MCV 88.7 04/16/2019   PLT 194.0 04/16/2019    No results found for: LABCA2  No components found for: NWGNFA213  No results for input(s): INR in the last 168 hours.  No results found for: LABCA2  No results found for: YQM578  No results found for: ION629  No results found for: BMW413  No results found for: CA2729  No components found for: HGQUANT  No results found for: CEA1 / No results found for: CEA1   No results found for: AFPTUMOR  No results found for: CHROMOGRNA  No results found for: PSA1  No visits with results within 3 Day(s) from this visit.  Latest known visit with results is:  Office Visit on 04/16/2019  Component Date Value Ref Range Status  . Sodium 04/16/2019 138  135 - 145 mEq/L Final  . Potassium 04/16/2019 4.3  3.5 - 5.1 mEq/L Final  . Chloride 04/16/2019 103  96 - 112 mEq/L Final  . CO2 04/16/2019 26  19 - 32 mEq/L Final  . Glucose, Bld 04/16/2019 105* 70 - 99 mg/dL Final  . BUN 04/16/2019 19  6 - 23 mg/dL Final  . Creatinine, Ser 04/16/2019 0.91  0.40 - 1.20 mg/dL Final  . Calcium 04/16/2019 9.1  8.4 - 10.5 mg/dL Final  . GFR 04/16/2019 62.47  >60.00 mL/min Final  . Cholesterol 04/16/2019 186  0 - 200 mg/dL Final   ATP III Classification        Desirable:  < 200 mg/dL               Borderline High:  200 -  239 mg/dL          High:  > = 240 mg/dL  . Triglycerides 04/16/2019 226.0* 0.0 - 149.0 mg/dL Final   Normal:  <150 mg/dLBorderline High:  150 - 199 mg/dL  . HDL 04/16/2019 62.90  >39.00 mg/dL Final  . VLDL 04/16/2019 45.2* 0.0 - 40.0 mg/dL Final  . Total CHOL/HDL Ratio 04/16/2019 3   Final                  Men          Women1/2 Average Risk     3.4          3.3Average Risk          5.0          4.42X Average Risk          9.6          7.13X Average Risk          15.0          11.0                      . NonHDL 04/16/2019 123.53   Final   NOTE:  Non-HDL goal should be 30 mg/dL higher than patient's LDL goal (i.e. LDL goal of < 70 mg/dL, would have non-HDL goal of < 100 mg/dL)  . TSH 04/16/2019 3.31  0.35 - 4.50 uIU/mL Final  . Total Bilirubin 04/16/2019 0.3  0.2 - 1.2 mg/dL Final  . Bilirubin, Direct 04/16/2019 0.1  0.0 - 0.3 mg/dL Final  . Alkaline Phosphatase 04/16/2019 103  39 - 117 U/L Final  . AST 04/16/2019 18  0 - 37 U/L Final  . ALT 04/16/2019 22  0 - 35 U/L Final  . Total Protein 04/16/2019 6.8  6.0 - 8.3 g/dL Final  . Albumin 04/16/2019 4.5  3.5 - 5.2 g/dL Final  . WBC 04/16/2019 10.9* 4.0 - 10.5 K/uL Final  . RBC 04/16/2019 4.87  3.87 - 5.11 Mil/uL Final  . Hemoglobin 04/16/2019 14.6  12.0 - 15.0 g/dL Final  . HCT 04/16/2019 43.2  36.0 - 46.0 % Final  . MCV 04/16/2019 88.7  78.0 - 100.0 fl Final  . MCHC 04/16/2019 33.8  30.0 - 36.0 g/dL Final  . RDW 04/16/2019 12.7  11.5 - 15.5 % Final  . Platelets 04/16/2019 194.0  150.0 - 400.0 K/uL Final  . Neutrophils Relative % 04/16/2019 51.9  43.0 - 77.0 % Final  . Lymphocytes Relative 04/16/2019 40.4  12.0 - 46.0 % Final  . Monocytes Relative 04/16/2019 4.1  3.0 - 12.0 % Final  . Eosinophils Relative 04/16/2019 2.7  0.0 - 5.0 % Final  . Basophils Relative 04/16/2019 0.9  0.0 - 3.0 % Final  . Neutro Abs 04/16/2019 5.7  1.4 - 7.7 K/uL Final  . Lymphs Abs 04/16/2019 4.4*  0.7 - 4.0 K/uL Final  . Monocytes Absolute 04/16/2019 0.5  0.1 - 1.0 K/uL Final  . Eosinophils Absolute 04/16/2019 0.3  0.0 - 0.7 K/uL Final  . Basophils Absolute 04/16/2019 0.1  0.0 - 0.1 K/uL Final  . Direct LDL 04/16/2019 107.0  mg/dL Final   Optimal:  <100 mg/dLNear or Above Optimal:  100-129 mg/dLBorderline High:  130-159 mg/dLHigh:  160-189 mg/dLVery High:  >190 mg/dL    (this displays the last labs from the last 3 days)  No results found for: TOTALPROTELP, ALBUMINELP, A1GS, A2GS, BETS, BETA2SER, GAMS, MSPIKE, SPEI (this displays SPEP labs)  No results found for: KPAFRELGTCHN, LAMBDASER, KAPLAMBRATIO (kappa/lambda light chains)  No results found for: HGBA, HGBA2QUANT, HGBFQUANT, HGBSQUAN (Hemoglobinopathy evaluation)   No results found for: LDH  No results found for: IRON, TIBC, IRONPCTSAT (Iron and TIBC)  No results found for: FERRITIN  Urinalysis No results found for: COLORURINE, APPEARANCEUR, LABSPEC, PHURINE, GLUCOSEU, HGBUR, BILIRUBINUR, KETONESUR, PROTEINUR, UROBILINOGEN, NITRITE, LEUKOCYTESUR   STUDIES: Dg Bone Density (dxa)  Result Date: 07/16/2019 EXAM: DUAL X-RAY ABSORPTIOMETRY (DXA) FOR BONE MINERAL DENSITY IMPRESSION: Referring Physician:  Arvella Nigh Your patient completed a BMD test using Lunar IDXA DXA system ( analysis version: 16 ) manufactured by [employer]. Technologist: AW PATIENT: Name: Alexis Wood, Alexis Wood Patient ID: [id number] Birth Date: [birth year] Height: 63.0 in. Sex: Female Measured: 07/16/2019 Weight: 166.2 lbs. Indications: Anastrazole, Caucasian, Estrogen Deficient, Hypothyroid, Levothyroxine, osteoarthiritis, Postmenopausal, Tobacco User (Current Smoker), Wellbutrin Fractures: Finger Treatments: Hormone Therapy For Cancer, Vitamin D (E933.5) ASSESSMENT: The BMD measured at AP Spine L1-L4 is 1.077 g/cm2 with a T-score of -0.9. This patient is considered normal according to [facility]) criteria. The scan quality is good. Site  Region Measured Date Measured Age YA BMD Significant CHANGE T-score AP Spine  L1-L4      07/16/2019    63.1         -0.9    1.077 g/cm2 DualFemur Neck Right 07/16/2019    63.1         -0.6    0.955 g/cm2 DualFemur Total Mean 07/16/2019    63.1         -0.2    0.981 g/cm2 World Health Organization [facility]) criteria for post-menopausal, Caucasian Women: Normal       T-score at or above -1 SD Osteopenia   T-score between -1 and -2.5 SD Osteoporosis T-score at or below -2.5 SD RECOMMENDATION: 1. All patients should optimize calcium and vitamin D intake. 2. Consider FDA approved medical therapies in postmenopausal women and men aged 36 years and older, based on the following: a. A hip or vertebral (clinical or morphometric) fracture b. T- score < or = -2.5 at the femoral neck or spine after appropriate evaluation to exclude secondary causes c. Low bone mass (T-score between -1.0 and -2.5 at the femoral neck or spine) and a 10 year probability of a hip fracture > or = 3% or a 10 year probability of a major osteoporosis-related fracture > or = 20% based on the [place]-adapted WHO algorithm d. Clinician judgment and/or patient preferences may indicate treatment for people with 10-year fracture probabilities above or below these levels FOLLOW-UP: People with diagnosed cases of osteoporosis or at high risk for fracture should have regular bone mineral density tests. For patients eligible for Medicare, routine testing is allowed once every 2 years. The testing frequency can be increased to one year for patients who have rapidly progressing disease, those who are receiving or discontinuing medical therapy to restore bone mass, or have additional risk factors. I have reviewed this report and agree with the above findings. [facility] Electronically Signed   By: Lowella Grip III M.D.   On: 07/16/2019 08:13   Mm 3d Screen Breast Bilateral  Result Date: 07/16/2019 CLINICAL DATA:  Screening. EXAM: DIGITAL SCREENING  BILATERAL MAMMOGRAM WITH TOMO AND CAD COMPARISON:  Previous exam(s). ACR Breast Density Category b: There are scattered areas of fibroglandular density. FINDINGS: There are no findings suspicious for malignancy. Images were processed with CAD. IMPRESSION: No mammographic evidence of malignancy. A result letter of this screening mammogram  will be mailed directly to the patient. RECOMMENDATION: Screening mammogram in one year. (Code:[id number]-B-01Y) BI-RADS CATEGORY  1: Negative. Electronically Signed   By: Margarette Canada M.D.   On: 07/16/2019 15:52    ELIGIBLE FOR AVAILABLE RESEARCH PROTOCOL: no  ASSESSMENT: 63 y.o. [place] woman status post left lumpectomy 09/05/2015 for atypical ductal hyperplasia  1. Anastrozole started in 07/2017  (a) bone density at lumbar 06/10/2016 reported as "normal".  (b) bone density 07-16-2019 shows a T score of -0.9 (normal).  2. Biopsy on 01/09/2018 consistent with a complex sclerosing lesion in the right upper outer quadrant  (a) right lumpectomy 02/05/2018 shows only the complex sclerosing lesion, no evidence of malignancy  3.  Intensified screening:  (a) breast MRI June 2020  (b) bilateral screening mammography October 2020  4.  Benign lymphocytosis  PLAN:  Alexis Wood is 2 years into her planned 5 years of anastrozole, with good tolerance.  Her repeat bone density is reassuring  We talked about vitamin D and walking/weightbearing exercise and it is nice that she now has a fit bit to actually measure what she will be doing.  We are doing intensified screening with MRI alternating with mammography.  However her breast density is down to category B.  The additional sensitivity of MRI in these cases is not great.  I would not be uncomfortable doing a breast MRI every other year for example.  I am scheduling her for breast MRI in May and mammography in November in 2021, but if she has not met her deductible I may then she will cancel that and if she will  simply have mammography next year.  She is comfortable with this decision  She has been offered to have genetics testing.  At this point she declines.  Her absolute lymphocytes are slightly up.  They do not meet criteria for CLL screening.  We will simply keep an eye on this on a yearly basis.  If the lymphocyte count continues to go up then goes above 5 I will send him for flow cytometry.  She will see me again in 1 year.  She knows to call for any other issue that may develop before the next visit.     Magrinat, Virgie Dad, MD  07/28/19 9:27 AM Medical Oncology and Hematology [facility] [place], [place] [address] Tel. [phone]    Fax. [phone]    I, Wilburn Mylar, am acting as scribe for Dr. Virgie Dad. Magrinat.  I, Lurline Del MD, have reviewed the above documentation for accuracy and completeness, and I agree with the above.

## 2019-07-28 ENCOUNTER — Other Ambulatory Visit: Payer: Self-pay

## 2019-07-28 ENCOUNTER — Inpatient Hospital Stay: Payer: 59 | Attending: Oncology | Admitting: Oncology

## 2019-07-28 VITALS — BP 135/83 | Temp 98.3°F | Resp 18 | Ht 63.0 in | Wt 164.7 lb

## 2019-07-28 DIAGNOSIS — D72820 Lymphocytosis (symptomatic): Secondary | ICD-10-CM | POA: Diagnosis not present

## 2019-07-28 DIAGNOSIS — F419 Anxiety disorder, unspecified: Secondary | ICD-10-CM | POA: Diagnosis not present

## 2019-07-28 DIAGNOSIS — I10 Essential (primary) hypertension: Secondary | ICD-10-CM | POA: Diagnosis not present

## 2019-07-28 DIAGNOSIS — F32A Depression, unspecified: Secondary | ICD-10-CM

## 2019-07-28 DIAGNOSIS — F329 Major depressive disorder, single episode, unspecified: Secondary | ICD-10-CM

## 2019-07-28 DIAGNOSIS — Z79811 Long term (current) use of aromatase inhibitors: Secondary | ICD-10-CM | POA: Diagnosis not present

## 2019-07-28 DIAGNOSIS — N6092 Unspecified benign mammary dysplasia of left breast: Secondary | ICD-10-CM | POA: Diagnosis present

## 2019-07-28 MED ORDER — ANASTROZOLE 1 MG PO TABS: 1.0000 mg | ORAL_TABLET | Freq: Every day | ORAL | 4 refills | Status: DC

## 2019-07-29 ENCOUNTER — Telehealth: Payer: Self-pay | Admitting: Oncology

## 2019-07-29 NOTE — Telephone Encounter (Signed)
I talk with patient regarding schedule  

## 2019-07-30 LAB — HM COLONOSCOPY

## 2019-09-02 ENCOUNTER — Telehealth: Payer: Self-pay | Admitting: Family Medicine

## 2019-09-02 NOTE — Telephone Encounter (Signed)
Called pt and she would like to know how you would feel about her coming off her bupropion. Pt has a CPE scheduled in February and really didn't want to come in before then. Also She was advised to contact the insurance company to find out what alternatives they may have for cheaper medication.

## 2019-09-02 NOTE — Telephone Encounter (Signed)
Pt called in stating the Bupropion and Olmesartan is going to be going up in price as of Jan.1st 2021. She wanted to know if [facility] recommended any alternates. Pt also wanted to discuss the Bupropion with a nurse.   She wanted to start using the [facility] for her script / phone # [phone]  Pt can be reached at the home #

## 2019-09-03 ENCOUNTER — Other Ambulatory Visit: Payer: Self-pay

## 2019-09-03 ENCOUNTER — Other Ambulatory Visit: Payer: Self-pay | Admitting: General Practice

## 2019-09-03 ENCOUNTER — Encounter: Payer: Self-pay | Admitting: General Practice

## 2019-09-03 MED ORDER — BUPROPION HCL ER (XL) 300 MG PO TB24: 300.0000 mg | ORAL_TABLET | Freq: Every day | ORAL | 1 refills | Status: DC

## 2019-09-03 MED ORDER — ANASTROZOLE 1 MG PO TABS: 1.0000 mg | ORAL_TABLET | Freq: Every day | ORAL | 4 refills | Status: DC

## 2019-09-03 NOTE — Telephone Encounter (Signed)
VM received from pt stating she wants to get her anastrozole prescription switched to the [facility]. Returned call and obtained vm.  "Mailbox full"

## 2019-09-03 NOTE — Telephone Encounter (Signed)
This has always been a difficult time of year for her and I wouldn't recommend stopping her Bupropion at this time.  Often with insurance changes, they just want [place] to switch the brand or the type (short acting to long acting or vice versa).  Her insurance should be able to tell her what they are going to cover

## 2019-09-03 NOTE — Telephone Encounter (Signed)
Called and advised pt to contact oncology.   Also she states her medications are covered by insurance so she will stay on both. Wishes Dr. Birdie Riddle a Merry Christmas.

## 2019-09-06 ENCOUNTER — Other Ambulatory Visit: Payer: Self-pay | Admitting: Family Medicine

## 2019-09-06 NOTE — Telephone Encounter (Signed)
Pt called in asking for refills on the xanax, olmesartan, and the sertraline/ pt now uses the [facility].

## 2019-09-06 NOTE — Telephone Encounter (Signed)
Last OV 04/16/19 Alprazolam last filled 06/15/19 #60 with 3 olmesartan last filled 07/19/19 #30 with 3 Sertraline last filled 07/12/19 #30 with 3

## 2019-09-07 MED ORDER — OLMESARTAN MEDOXOMIL 20 MG PO TABS: 20.0000 mg | ORAL_TABLET | Freq: Every day | ORAL | 1 refills | Status: DC

## 2019-09-07 MED ORDER — ALPRAZOLAM 1 MG PO TABS: 1.0000 mg | ORAL_TABLET | Freq: Two times a day (BID) | ORAL | 0 refills | Status: DC | PRN

## 2019-09-07 MED ORDER — SERTRALINE HCL 25 MG PO TABS: 25.0000 mg | ORAL_TABLET | Freq: Every day | ORAL | 1 refills | Status: DC

## 2019-10-19 ENCOUNTER — Encounter: Payer: Self-pay | Admitting: Family Medicine

## 2019-10-19 ENCOUNTER — Other Ambulatory Visit: Payer: Self-pay

## 2019-10-19 ENCOUNTER — Ambulatory Visit (INDEPENDENT_AMBULATORY_CARE_PROVIDER_SITE_OTHER): Payer: 59 | Admitting: Family Medicine

## 2019-10-19 VITALS — BP 119/83 | HR 76 | Temp 97.8°F | Resp 16 | Ht 63.0 in | Wt 170.0 lb

## 2019-10-19 DIAGNOSIS — E669 Obesity, unspecified: Secondary | ICD-10-CM | POA: Diagnosis not present

## 2019-10-19 DIAGNOSIS — E559 Vitamin D deficiency, unspecified: Secondary | ICD-10-CM | POA: Diagnosis not present

## 2019-10-19 DIAGNOSIS — F419 Anxiety disorder, unspecified: Secondary | ICD-10-CM | POA: Diagnosis not present

## 2019-10-19 DIAGNOSIS — Z0000 Encounter for general adult medical examination without abnormal findings: Secondary | ICD-10-CM

## 2019-10-19 DIAGNOSIS — I10 Essential (primary) hypertension: Secondary | ICD-10-CM

## 2019-10-19 DIAGNOSIS — F32A Depression, unspecified: Secondary | ICD-10-CM

## 2019-10-19 DIAGNOSIS — F329 Major depressive disorder, single episode, unspecified: Secondary | ICD-10-CM

## 2019-10-19 LAB — CBC WITH DIFFERENTIAL/PLATELET
Basophils Absolute: 0 10*3/uL (ref 0.0–0.1)
Basophils Relative: 0.4 % (ref 0.0–3.0)
Eosinophils Absolute: 0.4 10*3/uL (ref 0.0–0.7)
Eosinophils Relative: 3.7 % (ref 0.0–5.0)
HCT: 44.5 % (ref 36.0–46.0)
Hemoglobin: 14.9 g/dL (ref 12.0–15.0)
Lymphocytes Relative: 35.8 % (ref 12.0–46.0)
Lymphs Abs: 4 10*3/uL (ref 0.7–4.0)
MCHC: 33.4 g/dL (ref 30.0–36.0)
MCV: 89.3 fl (ref 78.0–100.0)
Monocytes Absolute: 0.7 10*3/uL (ref 0.1–1.0)
Monocytes Relative: 6.2 % (ref 3.0–12.0)
Neutro Abs: 6 10*3/uL (ref 1.4–7.7)
Neutrophils Relative %: 53.9 % (ref 43.0–77.0)
Platelets: 188 10*3/uL (ref 150.0–400.0)
RBC: 4.99 Mil/uL (ref 3.87–5.11)
RDW: 12.8 % (ref 11.5–15.5)
WBC: 11.1 10*3/uL — ABNORMAL HIGH (ref 4.0–10.5)

## 2019-10-19 LAB — LIPID PANEL
Cholesterol: 204 mg/dL — ABNORMAL HIGH (ref 0–200)
HDL: 62.4 mg/dL (ref >39.00)
NonHDL: 141.96
Total CHOL/HDL Ratio: 3
Triglycerides: 322 mg/dL — ABNORMAL HIGH (ref 0.0–149.0)
VLDL: 64.4 mg/dL — ABNORMAL HIGH (ref 0.0–40.0)

## 2019-10-19 LAB — BASIC METABOLIC PANEL
BUN: 19 mg/dL (ref 6–23)
CO2: 29 mEq/L (ref 19–32)
Calcium: 9.4 mg/dL (ref 8.4–10.5)
Chloride: 101 mEq/L (ref 96–112)
Creatinine, Ser: 0.92 mg/dL (ref 0.40–1.20)
GFR: 61.58 mL/min (ref >60.00)
Glucose, Bld: 101 mg/dL — ABNORMAL HIGH (ref 70–99)
Potassium: 4.8 mEq/L (ref 3.5–5.1)
Sodium: 139 mEq/L (ref 135–145)

## 2019-10-19 LAB — HEPATIC FUNCTION PANEL
ALT: 28 U/L (ref 0–35)
AST: 21 U/L (ref 0–37)
Albumin: 4.4 g/dL (ref 3.5–5.2)
Alkaline Phosphatase: 95 U/L (ref 39–117)
Bilirubin, Direct: 0.1 mg/dL (ref 0.0–0.3)
Total Bilirubin: 0.3 mg/dL (ref 0.2–1.2)
Total Protein: 6.9 g/dL (ref 6.0–8.3)

## 2019-10-19 LAB — TSH: TSH: 2.82 u[IU]/mL (ref 0.35–4.50)

## 2019-10-19 LAB — LDL CHOLESTEROL, DIRECT: Direct LDL: 120 mg/dL

## 2019-10-19 LAB — VITAMIN D 25 HYDROXY (VIT D DEFICIENCY, FRACTURES): VITD: 40.7 ng/mL (ref 30.00–100.00)

## 2019-10-19 MED ORDER — SERTRALINE HCL 50 MG PO TABS: 50.0000 mg | ORAL_TABLET | Freq: Every day | ORAL | 3 refills | Status: DC

## 2019-10-19 NOTE — Assessment & Plan Note (Signed)
Deteriorated.  Pt continues to struggle w/ the loss of her parents.  Has not spoken to sister in ~2 yrs.  Requiring 2 Alprazolam every day rather than prn.  Based on this will increase Sertraline to 50mg  daily and monitor for symptom improvement.  Pt expressed understanding and is in agreement w/ plan.

## 2019-10-19 NOTE — Patient Instructions (Addendum)
Follow up in 6 months to recheck BP and cholesterol We'll notify you of your lab results and make any changes if needed Increase the Sertraline to 50mg  daily (2 of what you have at home and 1 of the new prescription) Continue to work on healthy diet and regular exercise Try and quit smoking- you can do it! Call with any questions or concerns Stay Safe!  Stay Healthy!

## 2019-10-19 NOTE — Progress Notes (Signed)
   Subjective:    Patient ID: Alexis Wood, female    DOB: [birth year], 65 y.o.   MRN: [id number]  HPI CPE- UTD on colonoscopy, Tdap, DEXA, pap, mammo, flu.  Plans to get COVID vaccine   Review of Systems Patient reports no vision/ hearing changes, adenopathy,fever, persistant/recurrent hoarseness , swallowing issues, chest pain, palpitations, edema, persistant/recurrent cough, hemoptysis, dyspnea (rest/exertional/paroxysmal nocturnal), gastrointestinal bleeding (melena, rectal bleeding), abdominal pain, significant heartburn, bowel changes, GU symptoms (dysuria, hematuria, incontinence), Gyn symptoms (abnormal  bleeding, pain),  syncope, focal weakness, memory loss, numbness & tingling, skin/hair/nail changes, abnormal bruising or bleeding.   + weight gain- 5 lbs since November.  Has started walking 3-4x/week. + anxiety/depression- takes 2 alprazolam daily b/c 'I just can't get over losing my parents'.  Currently on Wellbutrin 300mg  and Sertraline 25mg  daily.  This visit occurred during the SARS-CoV-2 public health emergency.  Safety protocols were in place, including screening questions prior to the visit, additional usage of staff PPE, and extensive cleaning of exam room while observing appropriate contact time as indicated for disinfecting solutions.     Objective:   Physical Exam General Appearance:    Alert, cooperative, no distress, appears stated age  Head:    Normocephalic, without obvious abnormality, atraumatic  Eyes:    PERRL, conjunctiva/corneas clear, EOM's intact, fundi    benign, both eyes  Ears:    Normal TM's and external ear canals, both ears  Nose:   Deferred due to COVID  Throat:   Neck:   Supple, symmetrical, trachea midline, no adenopathy;    Thyroid: no enlargement/tenderness/nodules  Back:     Symmetric, no curvature, ROM normal, no CVA tenderness  Lungs:     Clear to auscultation bilaterally, respirations unlabored  Chest Wall:    No tenderness or deformity    Heart:    Regular rate and rhythm, S1 and S2 normal, no murmur, rub   or gallop  Breast Exam:    Deferred to GYN  Abdomen:     Soft, non-tender, bowel sounds active all four quadrants,    no masses, no organomegaly  Genitalia:    Deferred to GYN  Rectal:    Extremities:   Extremities normal, atraumatic, no cyanosis or edema  Pulses:   2+ and symmetric all extremities  Skin:   Skin color, texture, turgor normal, no rashes or lesions  Lymph nodes:   Cervical, supraclavicular, and axillary nodes normal  Neurologic:   CNII-XII intact, normal strength, sensation and reflexes    throughout          Assessment & Plan:

## 2019-10-19 NOTE — Assessment & Plan Note (Signed)
Pt's PE WNL w/ exception of obesity.  UTD on immunizations, GYN, colonoscopy.  Check labs.  Anticipatory guidance provided.  

## 2019-10-19 NOTE — Assessment & Plan Note (Signed)
Check labs and replete prn. 

## 2019-10-19 NOTE — Assessment & Plan Note (Signed)
Chronic problem.  Adequate control.  Asymptomatic.  Check labs.  No anticipated med changes.  Will follow. 

## 2019-10-19 NOTE — Assessment & Plan Note (Signed)
Deteriorated.  Pt has gained 5 lbs since last visit but has started walking 3-4x/week and has eliminated soda.  I applauded these efforts and encouraged her to continue.  Check labs to risk stratify.

## 2019-10-20 ENCOUNTER — Encounter: Payer: Self-pay | Admitting: Family Medicine

## 2019-10-29 ENCOUNTER — Other Ambulatory Visit: Payer: Self-pay | Admitting: Emergency Medicine

## 2019-10-29 DIAGNOSIS — D72829 Elevated white blood cell count, unspecified: Secondary | ICD-10-CM

## 2019-11-02 ENCOUNTER — Other Ambulatory Visit (INDEPENDENT_AMBULATORY_CARE_PROVIDER_SITE_OTHER): Payer: 59

## 2019-11-02 ENCOUNTER — Other Ambulatory Visit: Payer: Self-pay

## 2019-11-02 DIAGNOSIS — D72829 Elevated white blood cell count, unspecified: Secondary | ICD-10-CM

## 2019-11-02 LAB — CBC WITH DIFFERENTIAL/PLATELET
Basophils Absolute: 0.1 10*3/uL (ref 0.0–0.1)
Basophils Relative: 0.6 % (ref 0.0–3.0)
Eosinophils Absolute: 0.5 10*3/uL (ref 0.0–0.7)
Eosinophils Relative: 4.3 % (ref 0.0–5.0)
HCT: 43.9 % (ref 36.0–46.0)
Hemoglobin: 14.7 g/dL (ref 12.0–15.0)
Lymphocytes Relative: 39 % (ref 12.0–46.0)
Lymphs Abs: 4.2 10*3/uL — ABNORMAL HIGH (ref 0.7–4.0)
MCHC: 33.5 g/dL (ref 30.0–36.0)
MCV: 88.3 fl (ref 78.0–100.0)
Monocytes Absolute: 0.5 10*3/uL (ref 0.1–1.0)
Monocytes Relative: 4.9 % (ref 3.0–12.0)
Neutro Abs: 5.5 10*3/uL (ref 1.4–7.7)
Neutrophils Relative %: 51.2 % (ref 43.0–77.0)
Platelets: 197 10*3/uL (ref 150.0–400.0)
RBC: 4.97 Mil/uL (ref 3.87–5.11)
RDW: 12.8 % (ref 11.5–15.5)
WBC: 10.7 10*3/uL — ABNORMAL HIGH (ref 4.0–10.5)

## 2019-12-13 ENCOUNTER — Other Ambulatory Visit: Payer: Self-pay | Admitting: Family Medicine

## 2019-12-14 ENCOUNTER — Telehealth: Payer: Self-pay

## 2019-12-14 ENCOUNTER — Telehealth: Payer: Self-pay | Admitting: Family Medicine

## 2019-12-14 ENCOUNTER — Other Ambulatory Visit: Payer: Self-pay

## 2019-12-14 NOTE — Telephone Encounter (Signed)
Pt called in asking for a refill on the Alprazolam 1mg . Pt uses [facility] on [address]. Please advise.

## 2019-12-14 NOTE — Telephone Encounter (Signed)
Tried calling patient but unable to leave a vm message due to mailbox being full.

## 2019-12-14 NOTE — Telephone Encounter (Signed)
PCP out of office. Please contact patient to let her know this. I will send in a 30-day supply (60 tablets of medication) in PCP absence or if she wants to wait for PCP for 90-day supply she can. Let me know how she would like to proceed.

## 2019-12-14 NOTE — Telephone Encounter (Signed)
Would recommend a video visit be scheduled with PCP for this. Typically we do not recommend Cold [place], especially in long-term smokers due to risk of withdrawal symptoms and high rate of failure.

## 2019-12-14 NOTE — Telephone Encounter (Signed)
Patient states she wants to quit smoking and wants to know what her best option would be in regards to her other medications she is on. She states she doesn't know if she should quit cold [place] or try [place] . Please advise.

## 2019-12-14 NOTE — Telephone Encounter (Signed)
Patient called back and recommendations given to patient per PA Malva Cogan. Patient voiced understanding and states she will call back to schedule an appointment with Dr. Beverely Low.

## 2019-12-14 NOTE — Telephone Encounter (Signed)
Xanax last rx 09/07/19 #180 LOV: 10/19/19 CPE

## 2019-12-14 NOTE — Telephone Encounter (Signed)
Refill on Alprazolam, LFD 09/07/19 #180 no refills.

## 2019-12-14 NOTE — Telephone Encounter (Signed)
Duplicate message. This has been handled.

## 2019-12-15 NOTE — Telephone Encounter (Signed)
Patient is agreeable with the 60 mg. Please refill rx for 60 mg. Thank you

## 2019-12-20 ENCOUNTER — Encounter: Payer: Self-pay | Admitting: Family Medicine

## 2019-12-22 ENCOUNTER — Other Ambulatory Visit: Payer: Self-pay

## 2019-12-22 ENCOUNTER — Telehealth (INDEPENDENT_AMBULATORY_CARE_PROVIDER_SITE_OTHER): Payer: 59 | Admitting: Family Medicine

## 2019-12-22 ENCOUNTER — Encounter: Payer: Self-pay | Admitting: Family Medicine

## 2019-12-22 VITALS — BP 116/76 | Ht 63.0 in | Wt 170.0 lb

## 2019-12-22 DIAGNOSIS — F17210 Nicotine dependence, cigarettes, uncomplicated: Secondary | ICD-10-CM

## 2019-12-22 DIAGNOSIS — F17200 Nicotine dependence, unspecified, uncomplicated: Secondary | ICD-10-CM

## 2019-12-22 MED ORDER — CHANTIX STARTING MONTH PAK 0.5 MG X 11 & 1 MG X 42 PO TABS: ORAL_TABLET | ORAL | 0 refills | Status: DC

## 2019-12-22 MED ORDER — VARENICLINE TARTRATE 1 MG PO TABS: 1.0000 mg | ORAL_TABLET | Freq: Two times a day (BID) | ORAL | 3 refills | Status: DC

## 2019-12-22 NOTE — Progress Notes (Signed)
I have discussed the procedure for the virtual visit with the patient who has given consent to proceed with assessment and treatment.   Jahmiyah Dullea L Johni Narine, CMA     

## 2019-12-22 NOTE — Progress Notes (Signed)
Virtual Visit via Video   I connected with patient on 12/22/19 at 10:00 AM EDT by a video enabled telemedicine application and verified that I am speaking with the correct person using two identifiers.  Location patient: Home Location provider: Acupuncturist, Office Persons participating in the virtual visit: Patient, Provider, [place] (Jess B)  I discussed the limitations of evaluation and management by telemedicine and the availability of in person appointments. The patient expressed understanding and agreed to proceed.  Interactive audio and video telecommunications were attempted between this provider and patient, however failed, due to patient having technical difficulties OR patient did not have access to video capability.  We continued and completed visit with audio only.   Subjective:   HPI:   Smoking Cessation- has been a smoker x30 yrs, currently smoking 1/2 ppd.  Had considered going 'cold [place]' but was told that this will set her up for failure.  Interested in quitting.  Would like assistance to do so.  Currently on Wellbutrin XL 300mg .  Previously did Chantix but did not stick with it.  Denies any side effects when she took this previously.  She says she has 'a lot' of reasons for wanting to quit- periodontal disease, nicotine stained hair, risk of cancer.  ROS:  See pertinent positives and negatives per HPI.  Patient Active Problem List   Diagnosis Date Noted  . Anxiety and depression 04/16/2019  . Breast mass, right 12/29/2017  . Left Achilles tendinitis 10/20/2017  . Vitamin D deficiency 10/06/2017  . 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 Allergic rhinitis due to allergen 11/04/2011  . General medical examination 03/26/2011  . Obesity (BMI 30-39.9) 10/01/2010  . MACULAR DEGENERATION 03/09/2010  . Hypothyroidism 02/13/2010  . TOBACCO ABUSE 02/13/2010  . CARPAL TUNNEL SYNDROME, RIGHT 02/13/2010  . Essential hypertension 02/13/2010  .  OSTEOARTHRITIS 02/13/2010    Social History   Tobacco Use  . Smoking status: Current Every Day Smoker    Packs/day: 0.50    Types: Cigarettes  . Smokeless tobacco: Never Used  Substance Use Topics  . Alcohol use: No    Current Outpatient Medications:  .  ALPRAZolam (XANAX) 1 MG tablet, TAKE 1 TABLET (1 MG TOTAL) BY MOUTH 2 (TWO) TIMES DAILY AS NEEDED. FOR ANXIETY, Disp: 60 tablet, Rfl: 0 .  anastrozole (ARIMIDEX) 1 MG tablet, Take 1 tablet (1 mg total) by mouth daily., Disp: 90 tablet, Rfl: 4 .  buPROPion (WELLBUTRIN XL) 300 MG 24 hr tablet, Take 1 tablet (300 mg total) by mouth daily., Disp: 90 tablet, Rfl: 1 .  levothyroxine (SYNTHROID) 100 MCG tablet, TAKE 1 TABLET BY MOUTH EVERY DAY, Disp: 30 tablet, Rfl: 5 .  Melatonin 10 MG CAPS, Take by mouth., Disp: , Rfl:  .  Multiple Vitamins-Minerals (PRESERVISION AREDS 2) CAPS, Take by mouth., Disp: , Rfl:  .  olmesartan (BENICAR) 20 MG tablet, Take 1 tablet (20 mg total) by mouth daily., Disp: 90 tablet, Rfl: 1 .  sertraline (ZOLOFT) 50 MG tablet, Take 1 tablet (50 mg total) by mouth daily., Disp: 30 tablet, Rfl: 3 .  Vitamin D, Ergocalciferol, 2000 units CAPS, Take by mouth., Disp: , Rfl:   Allergies  Allergen Reactions  . Penicillins Hives  . Pristiq [Desvenlafaxine] Hives    Objective:   BP 116/76   Ht 5\' 3"  (1.6 m)   Wt 170 lb (77.1 kg)   LMP 03/09/2004   BMI 30.11 kg/m   Pt is able to speak clearly, coherently  without shortness of breath or increased work of breathing.  Thought process is linear.  Mood is appropriate.   Assessment and Plan:   Tobacco abuse- pt is now interested and committed to quitting smoking.  She has been smoking for over 30 yrs and is currently at a 1/2 ppd.  We discussed multiple options but she would really like to restart the Chantix.  We discussed stepping down her daily cigarettes until she was smoke free.  Pt is in agreement with this plan and we will follow closely to ensure success.    Neena Rhymes, MD 12/22/2019  Time spent with the patient: 12 minutes, of which >50% was spent in obtaining information about symptoms, reviewing previous labs, evaluations, and treatments, counseling about condition (please see the discussed topics above), and developing a plan to further investigate it; had a number of questions which I addressed.

## 2019-12-22 NOTE — Addendum Note (Signed)
Addended by: Sheliah Hatch on: 12/22/2019 05:04 PM   Modules accepted: Orders

## 2020-01-08 ENCOUNTER — Other Ambulatory Visit: Payer: Self-pay | Admitting: Family Medicine

## 2020-01-12 ENCOUNTER — Other Ambulatory Visit: Payer: Self-pay | Admitting: Physician Assistant

## 2020-01-13 NOTE — Telephone Encounter (Signed)
Alprazolam 1mg  tab Last filled 12/15/19 #60 with no refills Last office visit 12/22/19 Next office visit 02/04/20

## 2020-02-04 ENCOUNTER — Telehealth (INDEPENDENT_AMBULATORY_CARE_PROVIDER_SITE_OTHER): Payer: 59 | Admitting: Family Medicine

## 2020-02-04 ENCOUNTER — Encounter: Payer: Self-pay | Admitting: Family Medicine

## 2020-02-04 ENCOUNTER — Other Ambulatory Visit: Payer: Self-pay

## 2020-02-04 DIAGNOSIS — F17200 Nicotine dependence, unspecified, uncomplicated: Secondary | ICD-10-CM | POA: Diagnosis not present

## 2020-02-04 NOTE — Progress Notes (Signed)
I have discussed the procedure for the virtual visit with the patient who has given consent to proceed with assessment and treatment. Patient is unable to obtain vitals for her visit.  Lana Fish, LPN

## 2020-02-04 NOTE — Progress Notes (Signed)
Virtual Visit via Video   I connected with patient on 02/04/20 at  8:30 AM EDT by a video enabled telemedicine application and verified that I am speaking with the correct person using two identifiers.  Location patient: Home Location provider: Acupuncturist, Office Persons participating in the virtual visit: Patient, Provider, [place] (Jess C)  I discussed the limitations of evaluation and management by telemedicine and the availability of in person appointments. The patient expressed understanding and agreed to proceed.  Interactive audio and video telecommunications were attempted between this provider and patient, however failed, due to patient having technical difficulties OR patient did not have access to video capability.  We continued and completed visit with audio only.   Subjective:   HPI:   Smoking Cessation- pt was started on Chantix last visit.  Started meds on 4/9 and decided to do the 'slow quit method'.  Started smoking 10 cigs/day and then cut down from there.  Last cigarette was 4/30.  Pt reports no cravings at this time.  No medication side effects w/ exception of strange dreams.  Pt reports she had multiple reasons to quit- macular degeneration, periodontal disease, nicotine stains  ROS:   See pertinent positives and negatives per HPI.  Patient Active Problem List   Diagnosis Date Noted  . Anxiety and depression 04/16/2019  . Breast mass, right 12/29/2017  . Left Achilles tendinitis 10/20/2017  . Vitamin D deficiency 10/06/2017  . 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 Allergic rhinitis due to allergen 11/04/2011  . General medical examination 03/26/2011  . Obesity (BMI 30-39.9) 10/01/2010  . MACULAR DEGENERATION 03/09/2010  . Hypothyroidism 02/13/2010  . TOBACCO ABUSE 02/13/2010  . CARPAL TUNNEL SYNDROME, RIGHT 02/13/2010  . Essential hypertension 02/13/2010  . OSTEOARTHRITIS 02/13/2010    Social History   Tobacco Use  . Smoking  status: Former Smoker    Packs/day: 0.00    Types: Cigarettes    Quit date: 01/14/2020    Years since quitting: 0.0  . Smokeless tobacco: Never Used  Substance Use Topics  . Alcohol use: No    Current Outpatient Medications:  .  ALPRAZolam (XANAX) 1 MG tablet, TAKE 1 TABLET (1 MG TOTAL) BY MOUTH 2 (TWO) TIMES DAILY AS NEEDED. FOR ANXIETY, Disp: 60 tablet, Rfl: 0 .  anastrozole (ARIMIDEX) 1 MG tablet, Take 1 tablet (1 mg total) by mouth daily., Disp: 90 tablet, Rfl: 4 .  buPROPion (WELLBUTRIN XL) 300 MG 24 hr tablet, TAKE 1 TABLET BY MOUTH DAILY, Disp: 30 tablet, Rfl: 4 .  levothyroxine (SYNTHROID) 100 MCG tablet, TAKE 1 TABLET BY MOUTH EVERY DAY, Disp: 30 tablet, Rfl: 5 .  Melatonin 10 MG CAPS, Take by mouth., Disp: , Rfl:  .  Multiple Vitamins-Minerals (PRESERVISION AREDS 2) CAPS, Take by mouth., Disp: , Rfl:  .  olmesartan (BENICAR) 20 MG tablet, Take 1 tablet (20 mg total) by mouth daily., Disp: 90 tablet, Rfl: 1 .  sertraline (ZOLOFT) 50 MG tablet, Take 1 tablet (50 mg total) by mouth daily., Disp: 30 tablet, Rfl: 3 .  varenicline (CHANTIX CONTINUING MONTH PAK) 1 MG tablet, Take 1 tablet (1 mg total) by mouth 2 (two) times daily., Disp: 60 tablet, Rfl: 3 .  varenicline (CHANTIX STARTING MONTH PAK) 0.5 MG X 11 & 1 MG X 42 tablet, Take 1 0.5 mg tab PO daily  x3 days, then increase to 1 0.5 mg tab BID x4 days, then increase to one 1 mg tablet BID., Disp: 53 tablet,  Rfl: 0 .  Vitamin D, Ergocalciferol, 2000 units CAPS, Take by mouth., Disp: , Rfl:   Allergies  Allergen Reactions  . Penicillins Hives  . Pristiq [Desvenlafaxine] Hives    Objective:   LMP 03/09/2004  Pt is able to speak clearly, coherently without shortness of breath or increased work of breathing. Thought process is linear.  Mood is appropriate.   Assessment and Plan:   Tobacco abuse- improved.  Pt has been smoke free x3 weeks and remains motivated to stay that way.  Applauded her efforts.  Continue Chantix at this  time.  Will continue to follow.   Neena Rhymes, MD 02/04/2020  Time spent with the patient: 7 minutes, of which >50% was spent in obtaining information about symptoms, reviewing previous labs, evaluations, and treatments, counseling about condition (please see the discussed topics above), and developing a plan to further investigate it; had a number of questions which I addressed.

## 2020-02-13 ENCOUNTER — Other Ambulatory Visit: Payer: Self-pay | Admitting: Family Medicine

## 2020-02-15 NOTE — Telephone Encounter (Signed)
Last OV 02/04/20 Alprazolam last filled 01/14/20 #60 with 0

## 2020-02-16 ENCOUNTER — Encounter: Payer: Self-pay | Admitting: Family Medicine

## 2020-02-16 NOTE — Telephone Encounter (Signed)
Last OV 02/04/20 Alprazolam last filled 01/14/20 #60 with 0 

## 2020-02-18 ENCOUNTER — Ambulatory Visit
Admission: RE | Admit: 2020-02-18 | Discharge: 2020-02-18 | Disposition: A | Discharge status: Home / Self Care | Payer: 59 | Source: Ambulatory Visit | Attending: Oncology | Admitting: Oncology

## 2020-02-18 DIAGNOSIS — F419 Anxiety disorder, unspecified: Secondary | ICD-10-CM

## 2020-02-18 DIAGNOSIS — I10 Essential (primary) hypertension: Secondary | ICD-10-CM

## 2020-02-18 DIAGNOSIS — N6092 Unspecified benign mammary dysplasia of left breast: Secondary | ICD-10-CM

## 2020-02-18 DIAGNOSIS — F32A Depression, unspecified: Secondary | ICD-10-CM

## 2020-02-18 MED ORDER — GADOBUTROL 1 MMOL/ML IV SOLN
8.0000 mL | Freq: Once | INTRAVENOUS | Status: AC | PRN
  Administered 2020-02-18: 8 mL via INTRAVENOUS

## 2020-03-02 ENCOUNTER — Encounter: Payer: Self-pay | Admitting: Family Medicine

## 2020-03-06 ENCOUNTER — Encounter: Payer: Self-pay | Admitting: Family Medicine

## 2020-03-06 ENCOUNTER — Encounter (INDEPENDENT_AMBULATORY_CARE_PROVIDER_SITE_OTHER): Payer: Self-pay | Admitting: Ophthalmology

## 2020-03-06 ENCOUNTER — Other Ambulatory Visit: Payer: Self-pay

## 2020-03-06 ENCOUNTER — Ambulatory Visit (INDEPENDENT_AMBULATORY_CARE_PROVIDER_SITE_OTHER): Payer: 59 | Admitting: Ophthalmology

## 2020-03-06 DIAGNOSIS — H353222 Exudative age-related macular degeneration, left eye, with inactive choroidal neovascularization: Secondary | ICD-10-CM | POA: Insufficient documentation

## 2020-03-06 DIAGNOSIS — H43813 Vitreous degeneration, bilateral: Secondary | ICD-10-CM | POA: Diagnosis not present

## 2020-03-06 DIAGNOSIS — H353123 Nonexudative age-related macular degeneration, left eye, advanced atrophic without subfoveal involvement: Secondary | ICD-10-CM

## 2020-03-06 DIAGNOSIS — H04123 Dry eye syndrome of bilateral lacrimal glands: Secondary | ICD-10-CM | POA: Insufficient documentation

## 2020-03-06 DIAGNOSIS — H353112 Nonexudative age-related macular degeneration, right eye, intermediate dry stage: Secondary | ICD-10-CM

## 2020-03-06 HISTORY — DX: Nonexudative age-related macular degeneration, right eye, intermediate dry stage: H35.3112

## 2020-03-06 MED ORDER — LOSARTAN POTASSIUM 100 MG PO TABS: 100.0000 mg | ORAL_TABLET | Freq: Every day | ORAL | 3 refills | Status: DC

## 2020-03-06 NOTE — Progress Notes (Signed)
03/06/2020     CHIEF COMPLAINT Patient presents for Retina Follow Up   HISTORY OF PRESENT ILLNESS: Alexis Wood is a 64 y.o. female who presents to the clinic today for:   HPI    Retina Follow Up    Patient presents with  Dry AMD.  In both eyes.  This started 6 months ago.  Severity is mild.  Duration of 6 months.  Since onset it is stable.          Comments    6 Month F/U OU  Pt denies noticeable changes to [place] OU since last visit. Pt denies ocular pain, flashes of light, or floaters OU.         Last edited by Ileana Roup, COA on 03/06/2020  9:28 AM. (History)      Referring physician: Sheliah Hatch, MD [phone]  [place] [address]  HISTORICAL INFORMATION:   Selected notes from the MEDICAL RECORD NUMBER       CURRENT MEDICATIONS: No current outpatient medications on file. (Ophthalmic Drugs)   No current facility-administered medications for this visit. (Ophthalmic Drugs)   Current Outpatient Medications (Other)  Medication Sig  . ALPRAZolam (XANAX) 1 MG tablet TAKE 1 TABLET (1 MG TOTAL) BY MOUTH 2 (TWO) TIMES DAILY AS NEEDED. FOR ANXIETY  . anastrozole (ARIMIDEX) 1 MG tablet Take 1 tablet (1 mg total) by mouth daily.  Marland Kitchen buPROPion (WELLBUTRIN XL) 300 MG 24 hr tablet TAKE 1 TABLET BY MOUTH DAILY  . levothyroxine (SYNTHROID) 100 MCG tablet TAKE 1 TABLET BY MOUTH EVERY DAY  . Melatonin 10 MG CAPS Take by mouth.  . Multiple Vitamins-Minerals (PRESERVISION AREDS 2) CAPS Take by mouth.  . olmesartan (BENICAR) 20 MG tablet Take 1 tablet (20 mg total) by mouth daily.  . sertraline (ZOLOFT) 50 MG tablet Take 1 tablet (50 mg total) by mouth daily.  . varenicline (CHANTIX CONTINUING MONTH PAK) 1 MG tablet Take 1 tablet (1 mg total) by mouth 2 (two) times daily.  . varenicline (CHANTIX STARTING MONTH PAK) 0.5 MG X 11 & 1 MG X 42 tablet Take 1 0.5 mg tab PO daily  x3 days, then increase to 1 0.5 mg tab BID x4 days, then increase to one 1 mg tablet  BID.  Marland Kitchen Vitamin D, Ergocalciferol, 2000 units CAPS Take by mouth.   No current facility-administered medications for this visit. (Other)      REVIEW OF SYSTEMS:    ALLERGIES Allergies  Allergen Reactions  . Penicillins Hives  . Pristiq [Desvenlafaxine] Hives    PAST MEDICAL HISTORY Past Medical History:  Diagnosis Date  . Anxiety   . Arthritis    hands  . Dental crowns present   . Hypertension    states under control with med., has been on med. "several years"  . Hypothyroidism   . Macular degeneration   . Mass of right breast 01/2018   Past Surgical History:  Procedure Laterality Date  . BREAST LUMPECTOMY WITH NEEDLE LOCALIZATION Left 09/05/2015   Procedure: LEFT BREAST WIRE LOCALIZED  LUMPECTOMY TIMES 2;  Surgeon: Harriette Bouillon, MD;  Location: [facility];  Service: General;  Laterality: Left;  . BREAST LUMPECTOMY WITH RADIOACTIVE SEED LOCALIZATION Right 02/05/2018   Procedure: RIGHT BREAST LUMPECTOMY WITH RADIOACTIVE SEED LOCALIZATION;  Surgeon: Harriette Bouillon, MD;  Location: [facility];  Service: General;  Laterality: Right;  . cataract Bilateral   . DILATATION & CURRETTAGE/HYSTEROSCOPY WITH RESECTOCOPE  02/21/2006  .  THYROIDECTOMY  Age 48    FAMILY HISTORY Family History  Problem Relation Age of Onset  . Coronary artery disease Mother   . Hypertension Mother   . Dementia Mother   . Coronary artery disease Father   . Hypertension Father   . Diabetes Father   . Breast cancer Maternal Aunt   . Diabetes type I Other     SOCIAL HISTORY Social History   Tobacco Use  . Smoking status: Former Smoker    Packs/day: 0.00    Types: Cigarettes    Quit date: 01/14/2020    Years since quitting: 0.1  . Smokeless tobacco: Never Used  Vaping Use  . Vaping Use: Never used  Substance Use Topics  . Alcohol use: No  . Drug use: No         OPHTHALMIC EXAM:  Base Eye Exam    Visual Acuity (ETDRS)      Right Left   Dist [place]  20/20 -1 20/30 -1   Dist ph [place]  20/25 +1       Tonometry (Tonopen, 9:32 AM)      Right Left   Pressure 15 13       Pupils      Pupils Dark Light Shape React APD   Right PERRL 4 3 Round Brisk None   Left PERRL 4 3 Round Brisk None       Visual Fields (Counting fingers)      Left Right    Full Full       Extraocular Movement      Right Left    Full Full       Neuro/Psych    Oriented x3: Yes   Mood/Affect: Normal       Dilation    Both eyes: 1.0% Mydriacyl, 2.5% Phenylephrine @ 9:32 AM        Slit Lamp and Fundus Exam    External Exam      Right Left   External Normal Normal       Slit Lamp Exam      Right Left   Lids/Lashes Normal Normal   Conjunctiva/Sclera White and quiet White and quiet   Cornea Clear Clear   Anterior Chamber Deep and quiet Deep and quiet   Iris Round and reactive Round and reactive   Lens Posterior chamber intraocular lens Posterior chamber intraocular lens   Anterior Vitreous Normal Normal       Fundus Exam      Right Left   Posterior Vitreous Posterior vitreous detachment Posterior vitreous detachment   Disc Peripapillary atrophy Peripapillary atrophy   C/D Ratio 0.15 0.15   Macula Retinal pigment epithelial mottling, Geographic atrophy, Early age related macular degeneration    Vessels Normal    Periphery Normal           IMAGING AND PROCEDURES  Imaging and Procedures for 03/06/20  OCT, Retina - OU - Both Eyes       Right Eye Quality was good. Scan locations included subfoveal. Central Foveal Thickness: 262. Findings include abnormal foveal contour, retinal drusen , no SRF, no IRF, outer retinal atrophy.   Left Eye Central Foveal Thickness: 311. Findings include subretinal hyper-reflective material, no SRF, no IRF, abnormal foveal contour, outer retinal atrophy.   Notes Review with no active signs of choroidal neovascular membrane.  Old subretinal debris present OS worse than OD.  Outer retinal atrophy OU.  ASSESSMENT/PLAN:  Intermediate stage nonexudative age-related macular degeneration of right eye Patient reports successfully halted the use of tobacco smoke products.  I explained the terrific benefit of this to her ocular and central nervous system health regarding less ingestion of carbon monoxide via smoke inhalation      ICD-10-CM   1. 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 Advanced nonexudative age-related macular degeneration of left eye without subfoveal involvement  H35.3123   3. Dry eyes  H04.123   4. Posterior vitreous detachment of both eyes  H43.813   5. Intermediate stage nonexudative age-related macular degeneration of right eye  H35.3112     1.  2.  3.  Ophthalmic Meds Ordered this visit:  No orders of the defined types were placed in this encounter.      Return in about 6 months (around 09/05/2020) for DILATE OU, OCT.  There are no Patient Instructions on file for this visit.   Explained the diagnoses, plan, and follow up with the patient and they expressed understanding.  Patient expressed understanding of the importance of proper follow up care.   Clent Demark Mida Cory M.D. Diseases & Surgery of the Retina and Vitreous Retina & Diabetic [place] 03/06/20     Abbreviations: M myopia (nearsighted); A astigmatism; H hyperopia (farsighted); P presbyopia; Mrx spectacle prescription;  CTL contact lenses; OD right eye; OS left eye; OU both eyes  XT exotropia; ET esotropia; PEK punctate epithelial keratitis; PEE punctate epithelial erosions; DES dry eye syndrome; MGD meibomian gland dysfunction; ATs artificial tears; PFAT's preservative free artificial tears; [place] nuclear sclerotic cataract; PSC posterior subcapsular cataract; ERM epi-retinal membrane; PVD posterior vitreous detachment; RD retinal detachment; DM diabetes mellitus; DR diabetic  retinopathy; NPDR non-proliferative diabetic retinopathy; PDR proliferative diabetic retinopathy; CSME clinically significant macular edema; DME diabetic macular edema; dbh dot blot hemorrhages; CWS cotton wool spot; POAG primary open angle glaucoma; C/D cup-to-disc ratio; HVF humphrey visual field; GVF goldmann visual field; OCT optical coherence tomography; IOP intraocular pressure; BRVO Branch retinal vein occlusion; CRVO central retinal vein occlusion; CRAO central retinal artery occlusion; BRAO branch retinal artery occlusion; RT retinal tear; SB scleral buckle; PPV pars plana vitrectomy; VH Vitreous hemorrhage; PRP panretinal laser photocoagulation; IVK intravitreal kenalog; VMT vitreomacular traction; MH Macular hole;  NVD neovascularization of the disc; NVE neovascularization elsewhere; AREDS age related eye disease study; ARMD age related macular degeneration; POAG primary open angle glaucoma; EBMD epithelial/anterior basement membrane dystrophy; ACIOL anterior chamber intraocular lens; IOL intraocular lens; PCIOL posterior chamber intraocular lens; Phaco/IOL phacoemulsification with intraocular lens placement; [place] photorefractive keratectomy; LASIK laser assisted in situ keratomileusis; HTN hypertension; DM diabetes mellitus; COPD chronic obstructive pulmonary disease

## 2020-03-06 NOTE — Assessment & Plan Note (Signed)
Patient reports successfully halted the use of tobacco smoke products.  I explained the terrific benefit of this to her ocular and central nervous system health regarding less ingestion of carbon monoxide via smoke inhalation

## 2020-03-08 IMAGING — MR MR BILATERAL BREAST WITHOUT AND WITH CONTRAST
5 of 13 series · 19 of 48 positions shown · IV contrast (Yes)
Comparison: Previous exam(s).

CLINICAL DATA: High-risk screening for breast cancer. BRCA
mutation, first-degree relatives and history of chest radiation.
Patient is status post prior left lumpectomy in Monday August, 2015 for
atypical ductal hyperplasia.

LABS:  Creatinine was obtained on site at [HOSPITAL] at [REDACTED] [HOSPITAL].
Results: GFR greater than 60
EXAM:
BILATERAL BREAST MRI WITH AND WITHOUT CONTRAST
TECHNIQUE: Multiplanar, multisequence MR images of both breasts were obtained
prior to and following the intravenous administration of 15 ml of
MultiHance.

[Series 1: (phone_number) · axial · 7.0mm · 1.56mm/px · 1 of 25 slices shown]
[im 1/25]
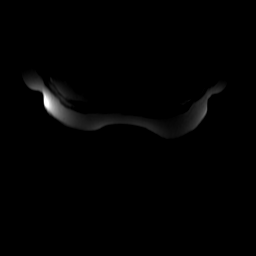

[Series 4: ax ir · axial · 3.0mm · 0.70mm/px · 1 of 83 slices shown]
[im 1/83]
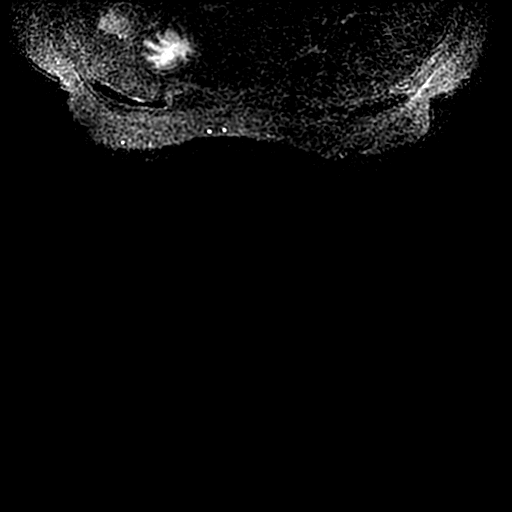

[Series 600: vibrant mph +c · axial · 2.0mm · 0.68mm/px · z∈[-114,+133]mm · 5 of 248 slices shown]
[im 1/248]
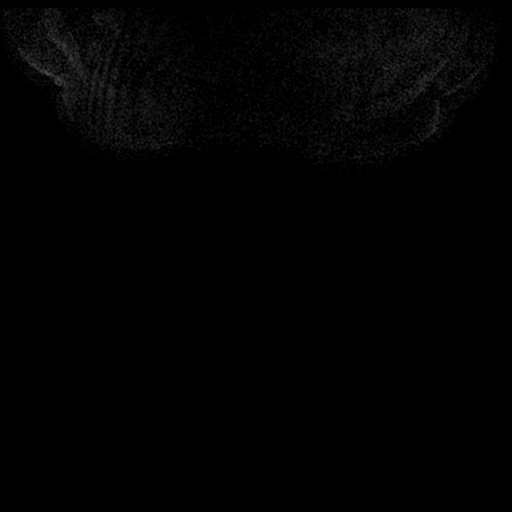
[im 62/248]
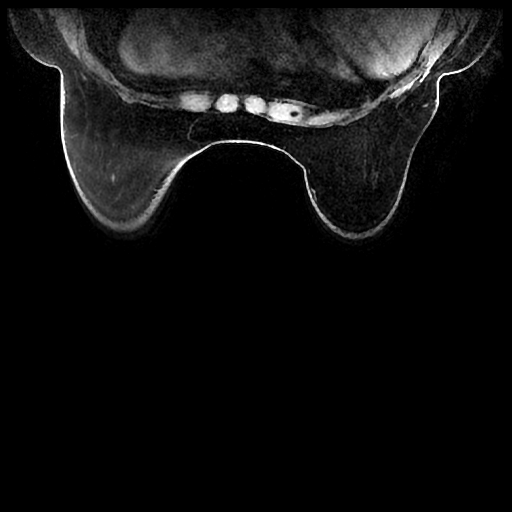
[im 124/248]
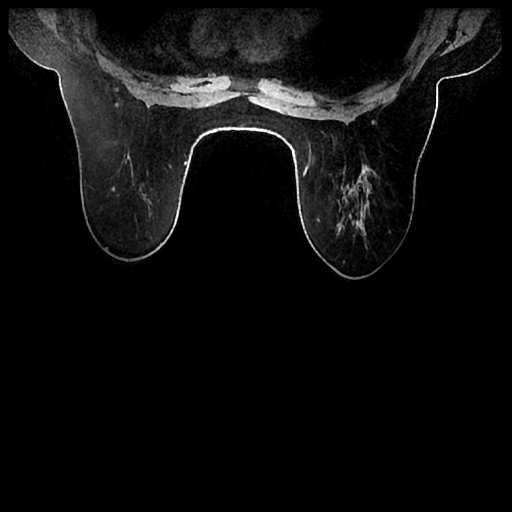
[im 186/248]
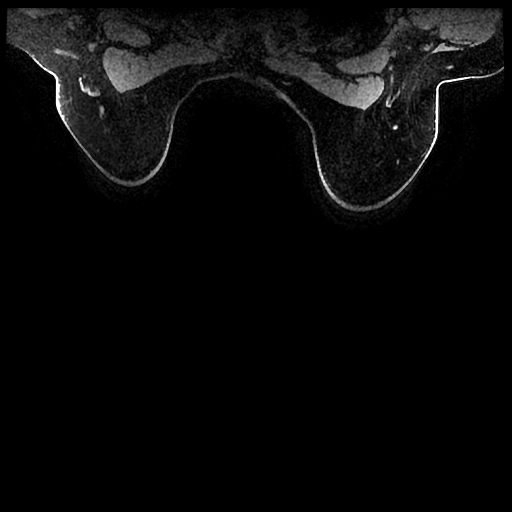
[im 248/248]
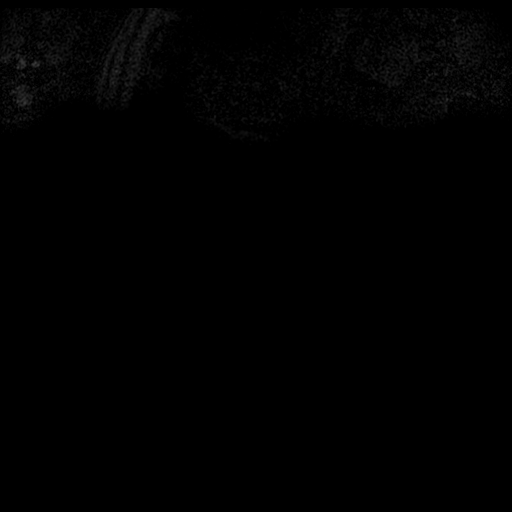

[Series 601: ph1/ vibrant mph · axial · 2.0mm · 0.68mm/px · z∈[-114,+133]mm · 6 of 248 slices shown]
[im 1/248]
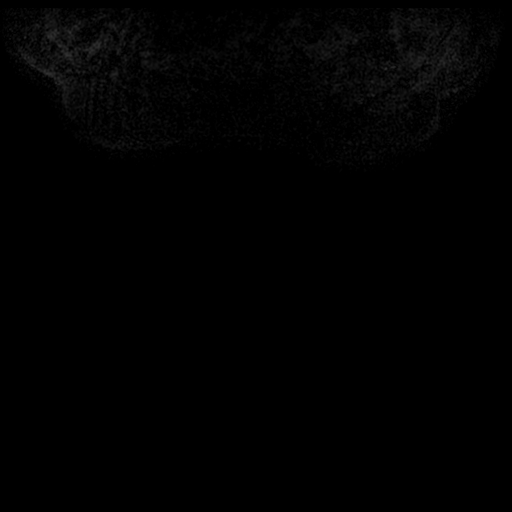
[im 50/248]
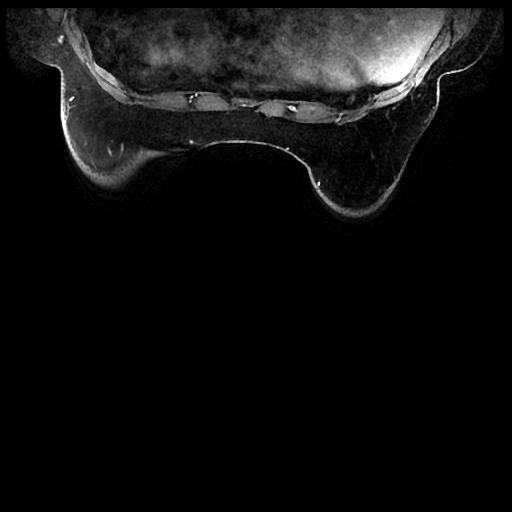
[im 99/248]
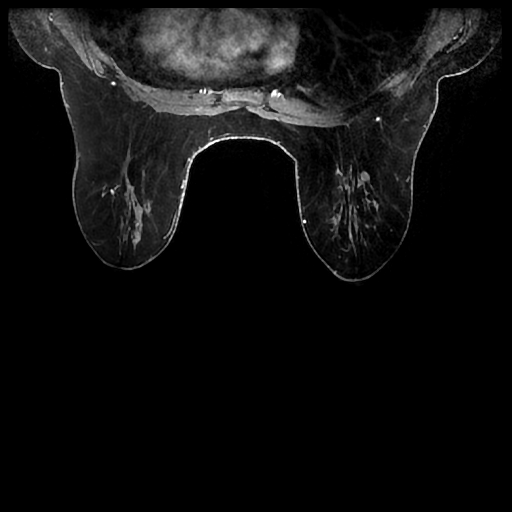
[im 149/248]
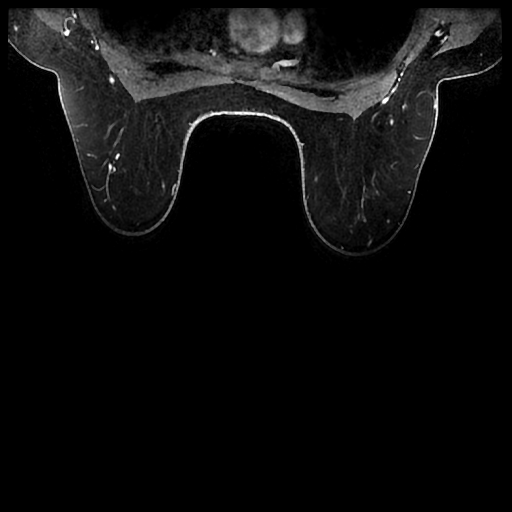
[im 198/248]
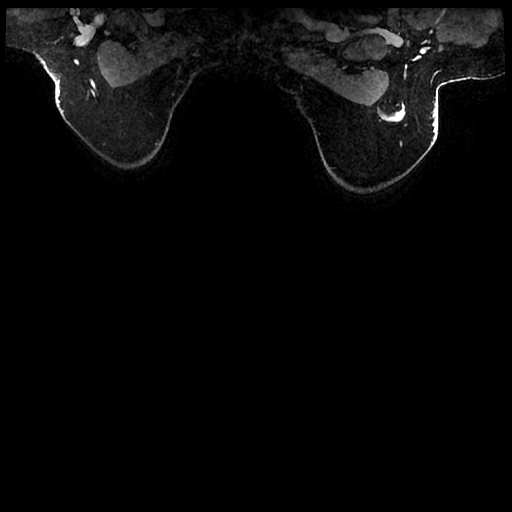
[im 248/248]
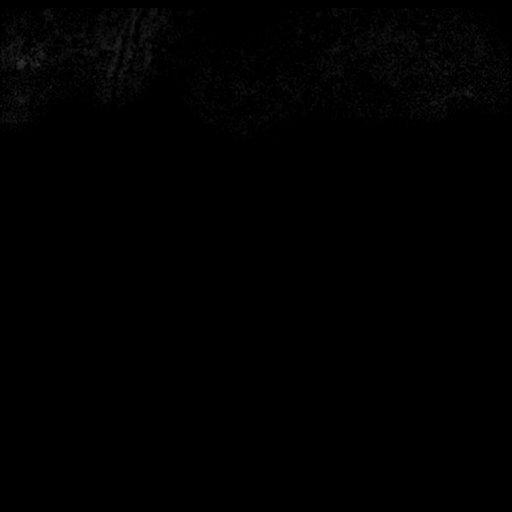

[Series 602: ph2/ vibrant mph · axial · 2.0mm · 0.68mm/px · z∈[-114,+133]mm · 6 of 248 slices shown]
[im 1/248]
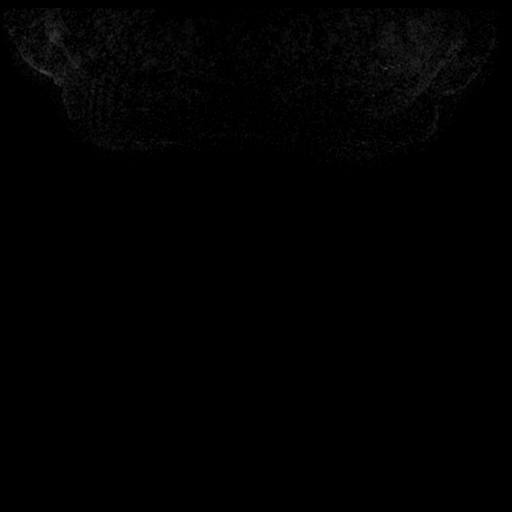
[im 50/248]
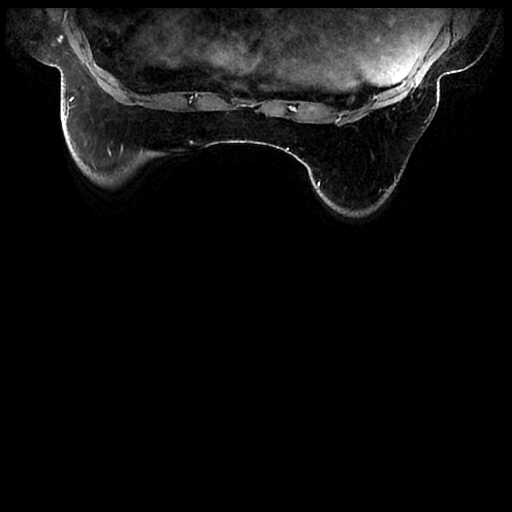
[im 99/248]
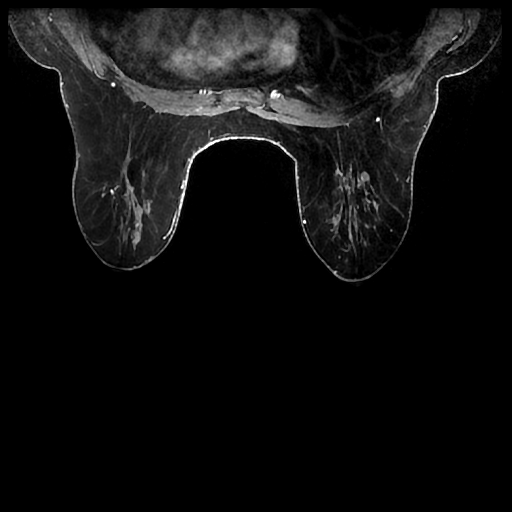
[im 149/248]
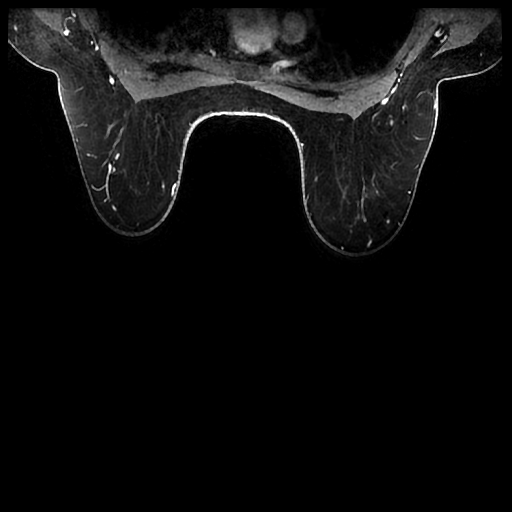
[im 198/248]
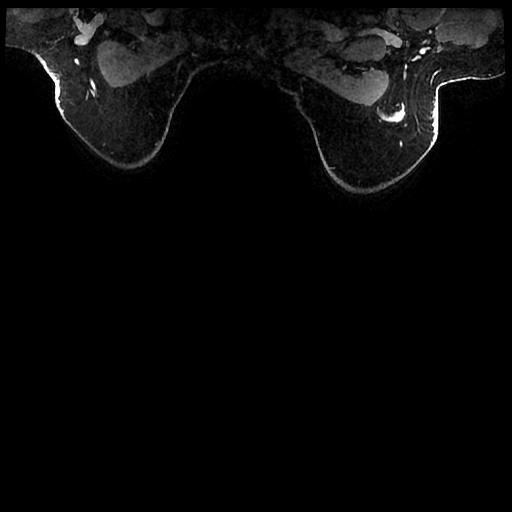
[im 248/248]
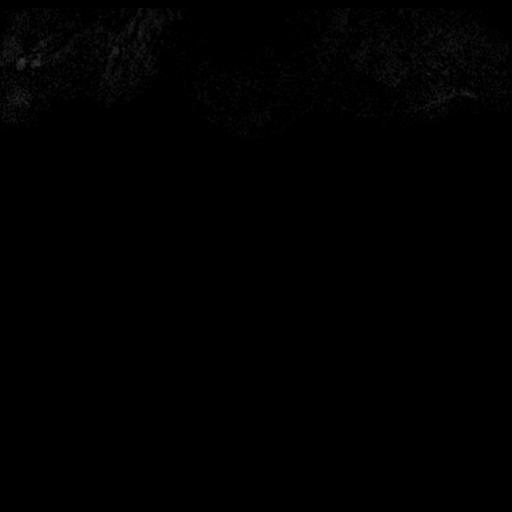

[19 of 48 positions shown; findings below may reference images not displayed]

THREE-DIMENSIONAL MR IMAGE RENDERING ON INDEPENDENT WORKSTATION:

Three-dimensional MR images were rendered by post-processing of the
original MR data on an independent workstation. The
three-dimensional MR images were interpreted, and findings are
reported in the following complete MRI report for this study. Three
dimensional images were evaluated at the independent [employer]
workstation
FINDINGS: Breast composition: c. Heterogeneous fibroglandular tissue.

Background parenchymal enhancement: Mild.

Right breast: There is a spiculated enhancing mass in the posterior
depth slight lateral upper right breast with plateau enhancement
kinetics measuring 1.2 x 0.9 x 1.5 cm.

Left breast: No abnormal enhancement is identified in the left
breast. Cysts are identified within the left breast.

Lymph nodes: No abnormal appearing lymph nodes.

Ancillary findings:  None.
IMPRESSION: Suspicious findings.

RECOMMENDATION:
MRI guided core biopsy of right breast mass.

BI-RADS CATEGORY  4: Suspicious.

## 2020-03-11 ENCOUNTER — Other Ambulatory Visit: Payer: Self-pay | Admitting: Family Medicine

## 2020-03-13 ENCOUNTER — Other Ambulatory Visit: Payer: Self-pay | Admitting: Family Medicine

## 2020-03-13 NOTE — Telephone Encounter (Signed)
Last OV 02/04/20 Alprazolam last filled 02/16/20 #60 with 0

## 2020-04-11 ENCOUNTER — Ambulatory Visit: Payer: 59 | Admitting: Family Medicine

## 2020-04-11 ENCOUNTER — Other Ambulatory Visit: Payer: Self-pay

## 2020-04-11 ENCOUNTER — Encounter: Payer: Self-pay | Admitting: Family Medicine

## 2020-04-11 VITALS — BP 121/84 | HR 87 | Temp 98.0°F | Resp 16 | Ht 63.0 in | Wt 173.4 lb

## 2020-04-11 DIAGNOSIS — E039 Hypothyroidism, unspecified: Secondary | ICD-10-CM | POA: Diagnosis not present

## 2020-04-11 DIAGNOSIS — Z23 Encounter for immunization: Secondary | ICD-10-CM

## 2020-04-11 DIAGNOSIS — E785 Hyperlipidemia, unspecified: Secondary | ICD-10-CM

## 2020-04-11 DIAGNOSIS — F329 Major depressive disorder, single episode, unspecified: Secondary | ICD-10-CM

## 2020-04-11 DIAGNOSIS — I10 Essential (primary) hypertension: Secondary | ICD-10-CM

## 2020-04-11 DIAGNOSIS — F419 Anxiety disorder, unspecified: Secondary | ICD-10-CM

## 2020-04-11 DIAGNOSIS — F32A Depression, unspecified: Secondary | ICD-10-CM

## 2020-04-11 DIAGNOSIS — F17200 Nicotine dependence, unspecified, uncomplicated: Secondary | ICD-10-CM

## 2020-04-11 LAB — BASIC METABOLIC PANEL
BUN: 18 mg/dL (ref 6–23)
CO2: 28 mEq/L (ref 19–32)
Calcium: 9.7 mg/dL (ref 8.4–10.5)
Chloride: 101 mEq/L (ref 96–112)
Creatinine, Ser: 0.94 mg/dL (ref 0.40–1.20)
GFR: 59.98 mL/min — ABNORMAL LOW (ref >60.00)
Glucose, Bld: 109 mg/dL — ABNORMAL HIGH (ref 70–99)
Potassium: 4.4 mEq/L (ref 3.5–5.1)
Sodium: 138 mEq/L (ref 135–145)

## 2020-04-11 LAB — HEPATIC FUNCTION PANEL
ALT: 31 U/L (ref 0–35)
AST: 23 U/L (ref 0–37)
Albumin: 4.6 g/dL (ref 3.5–5.2)
Alkaline Phosphatase: 87 U/L (ref 39–117)
Bilirubin, Direct: 0.1 mg/dL (ref 0.0–0.3)
Total Bilirubin: 0.5 mg/dL (ref 0.2–1.2)
Total Protein: 7.2 g/dL (ref 6.0–8.3)

## 2020-04-11 LAB — CBC WITH DIFFERENTIAL/PLATELET
Basophils Absolute: 0 10*3/uL (ref 0.0–0.1)
Basophils Relative: 0.4 % (ref 0.0–3.0)
Eosinophils Absolute: 0.5 10*3/uL (ref 0.0–0.7)
Eosinophils Relative: 4.8 % (ref 0.0–5.0)
HCT: 44.3 % (ref 36.0–46.0)
Hemoglobin: 14.8 g/dL (ref 12.0–15.0)
Lymphocytes Relative: 38.8 % (ref 12.0–46.0)
Lymphs Abs: 4.2 10*3/uL — ABNORMAL HIGH (ref 0.7–4.0)
MCHC: 33.5 g/dL (ref 30.0–36.0)
MCV: 88.9 fl (ref 78.0–100.0)
Monocytes Absolute: 0.6 10*3/uL (ref 0.1–1.0)
Monocytes Relative: 5.2 % (ref 3.0–12.0)
Neutro Abs: 5.5 10*3/uL (ref 1.4–7.7)
Neutrophils Relative %: 50.8 % (ref 43.0–77.0)
Platelets: 219 10*3/uL (ref 150.0–400.0)
RBC: 4.98 Mil/uL (ref 3.87–5.11)
RDW: 13 % (ref 11.5–15.5)
WBC: 10.9 10*3/uL — ABNORMAL HIGH (ref 4.0–10.5)

## 2020-04-11 LAB — LIPID PANEL
Cholesterol: 192 mg/dL (ref 0–200)
HDL: 68.2 mg/dL (ref >39.00)
LDL Cholesterol: 92 mg/dL (ref 0–99)
NonHDL: 123.72
Total CHOL/HDL Ratio: 3
Triglycerides: 159 mg/dL — ABNORMAL HIGH (ref 0.0–149.0)
VLDL: 31.8 mg/dL (ref 0.0–40.0)

## 2020-04-11 LAB — TSH: TSH: 12.2 u[IU]/mL — ABNORMAL HIGH (ref 0.35–4.50)

## 2020-04-11 MED ORDER — SERTRALINE HCL 100 MG PO TABS
100.0000 mg | ORAL_TABLET | Freq: Every day | ORAL | 3 refills | Status: DC
Start: 2020-04-11 — End: 2020-07-21

## 2020-04-11 NOTE — Assessment & Plan Note (Signed)
Chronic problem.  Currently asymptomatic.  Check labs.  Adjust meds prn  

## 2020-04-11 NOTE — Assessment & Plan Note (Signed)
Chronic problem.  Well controlled.  Asymptomatic.  Check labs.  No anticipated med changes. 

## 2020-04-11 NOTE — Progress Notes (Signed)
° °  Subjective:    Patient ID: Alexis Wood, female    DOB: Dec 11, 1955, 64 y.o.   MRN: [id number]  HPI HTN- chronic problem, on Losartan 100mg  daily w/ good control.  No CP, SOB, HAs, visual changes, edema.  Hyperlipidemia- chronic problem, has been attempting to control w/ diet and exercise.  No abd pain, N/V.  Hypothyroid- chronic problem, on Levothyroxie daily.  No changes to skin/hair/nails.  Anxiety/depression- ongoing issue, on Sertraline 50mg  daily, Wellbutrin 300mg  daily.  She is still struggling w/ the death of her parents.  She is now selling their house.  She had to euthanize her dog last week.  'i'm just frustrated.  It's everything at once'.  Is considering restarting therapy.  Tobacco abuse- pt hadn't smoked since April but then had a cigarette on Saturday.  Is now smoking 'at most 3 a day'.   Review of Systems For ROS see HPI   This visit occurred during the SARS-CoV-2 public health emergency.  Safety protocols were in place, including screening questions prior to the visit, additional usage of staff PPE, and extensive cleaning of exam room while observing appropriate contact time as indicated for disinfecting solutions.       Objective:   Physical Exam Vitals reviewed.  Constitutional:      General: She is not in acute distress.    Appearance: She is well-developed.  HENT:     Head: Normocephalic and atraumatic.  Eyes:     Conjunctiva/sclera: Conjunctivae normal.     Pupils: Pupils are equal, round, and reactive to light.  Neck:     Thyroid: No thyromegaly.  Cardiovascular:     Rate and Rhythm: Normal rate and regular rhythm.     Heart sounds: Normal heart sounds. No murmur heard.   Pulmonary:     Effort: Pulmonary effort is normal. No respiratory distress.     Breath sounds: Normal breath sounds.  Abdominal:     General: There is no distension.     Palpations: Abdomen is soft.     Tenderness: There is no abdominal tenderness.  Musculoskeletal:       Cervical back: Normal range of motion and neck supple.  Lymphadenopathy:     Cervical: No cervical adenopathy.  Skin:    General: Skin is warm and dry.  Neurological:     Mental Status: She is alert and oriented to person, place, and time.  Psychiatric:        Behavior: Behavior normal.           Assessment & Plan:

## 2020-04-11 NOTE — Assessment & Plan Note (Signed)
Deteriorated.  Pt is really struggling w/ the loss of her parents again now that she is trying to sell their house.  She also lost her dog recently.  Increase Sertraline to 100mg  daily and continue Wellbutrin.  Encouraged her to restart counseling.  Will follow closely.

## 2020-04-11 NOTE — Assessment & Plan Note (Signed)
Chronic problem.  Attempting to control w/ diet and exercise.  Check labs and determine if medication is needed.  Will follow.

## 2020-04-11 NOTE — Patient Instructions (Addendum)
Follow up in 4-6 weeks to recheck mood We'll notify you of your lab results and make any changes if needed INCREASE the Sertraline to 100mg  daily- 2 of what you have at home and 1 of the new prescription CONTINUE the Bupropion daily RESTART counseling You do NOT need to smoke.  You are doing SO great without cigarettes! Call with any questions or concerns Hang in there!

## 2020-04-11 NOTE — Assessment & Plan Note (Signed)
Pt had been smoke free from April 30th until Saturday 7/24.  Stressed that she doesn't need the Chantix (which has been recalled) and that she has proven she is able to quit.  Encouraged her to again be smoke free.  Will follow.

## 2020-04-12 ENCOUNTER — Encounter: Payer: Self-pay | Admitting: Family Medicine

## 2020-04-12 ENCOUNTER — Other Ambulatory Visit: Payer: Self-pay | Admitting: General Practice

## 2020-04-12 DIAGNOSIS — E039 Hypothyroidism, unspecified: Secondary | ICD-10-CM

## 2020-04-12 MED ORDER — LEVOTHYROXINE SODIUM 112 MCG PO TABS
112.0000 ug | ORAL_TABLET | Freq: Every day | ORAL | 3 refills | Status: DC
Start: 2020-04-12 — End: 2020-07-21

## 2020-05-22 ENCOUNTER — Encounter: Payer: Self-pay | Admitting: Family Medicine

## 2020-05-23 ENCOUNTER — Encounter: Payer: Self-pay | Admitting: Family Medicine

## 2020-05-24 ENCOUNTER — Ambulatory Visit: Payer: 59 | Admitting: Family Medicine

## 2020-05-26 ENCOUNTER — Ambulatory Visit: Payer: 59 | Admitting: Family Medicine

## 2020-05-29 ENCOUNTER — Encounter: Payer: Self-pay | Admitting: Family Medicine

## 2020-06-02 ENCOUNTER — Ambulatory Visit: Payer: 59 | Admitting: Family Medicine

## 2020-06-05 ENCOUNTER — Other Ambulatory Visit: Payer: Self-pay

## 2020-06-05 ENCOUNTER — Encounter: Payer: Self-pay | Admitting: Family Medicine

## 2020-06-05 ENCOUNTER — Ambulatory Visit: Payer: 59 | Admitting: Family Medicine

## 2020-06-05 VITALS — BP 120/80 | HR 96 | Temp 97.9°F | Resp 16 | Ht 63.0 in | Wt 166.2 lb

## 2020-06-05 DIAGNOSIS — F419 Anxiety disorder, unspecified: Secondary | ICD-10-CM | POA: Diagnosis not present

## 2020-06-05 DIAGNOSIS — Z23 Encounter for immunization: Secondary | ICD-10-CM

## 2020-06-05 DIAGNOSIS — E669 Obesity, unspecified: Secondary | ICD-10-CM | POA: Diagnosis not present

## 2020-06-05 DIAGNOSIS — F32A Depression, unspecified: Secondary | ICD-10-CM

## 2020-06-05 DIAGNOSIS — F329 Major depressive disorder, single episode, unspecified: Secondary | ICD-10-CM | POA: Diagnosis not present

## 2020-06-05 DIAGNOSIS — F17200 Nicotine dependence, unspecified, uncomplicated: Secondary | ICD-10-CM | POA: Diagnosis not present

## 2020-06-05 NOTE — Assessment & Plan Note (Signed)
Pt is down 7 lbs since last visit and BMI is now 29.45.  Applauded her efforts at cleaning up her diet and encouraged her to resume exercise as weather cools.  Will follow.

## 2020-06-05 NOTE — Progress Notes (Signed)
° °  Subjective:    Patient ID: Alexis Wood, female    DOB: [birth year], 64 y.o.   MRN: [id number]  HPI Anxiety/Depression- at last visit we increased Sertraline to 100mg  daily.  She is also on Wellbutrin XL 300mg  daily.  'I feel the best I have felt in a long time'.  Sleeping better.  Obesity- pt has lost 7 lbs since last visit and BMI is now 29.45  Pt is waiting for weather to improve to resume walking.  Pt has cleaned up diet- whole grains, some fruits and veggies.  Tobacco use- Pt plans that last cigarette will be on Wednesday b/c her bday is on Thursday.  Chantix has been recalled but she plans to do it without medication   Review of Systems For ROS see HPI   This visit occurred during the SARS-CoV-2 public health emergency.  Safety protocols were in place, including screening questions prior to the visit, additional usage of staff PPE, and extensive cleaning of exam room while observing appropriate contact time as indicated for disinfecting solutions.       Objective:   Physical Exam Vitals reviewed.  Constitutional:      General: She is not in acute distress.    Appearance: Normal appearance. She is not ill-appearing.  HENT:     Head: Normocephalic and atraumatic.  Skin:    General: Skin is warm and dry.  Neurological:     General: No focal deficit present.     Mental Status: She is alert and oriented to person, place, and time.     Gait: Gait normal.  Psychiatric:        Mood and Affect: Mood normal.        Behavior: Behavior normal.        Thought Content: Thought content normal.           Assessment & Plan:

## 2020-06-05 NOTE — Assessment & Plan Note (Signed)
Much improved w/ increased Sertraline dose (now 100mg  daily)  Remains on Wellbutrin XL 300mg  daily.  No med changes at this time.  Will follow.

## 2020-06-05 NOTE — Assessment & Plan Note (Signed)
Ongoing issue for pt.  She plans to have her last cigarette on Wednesday b/c her birthday is Thursday and she wants to be smoke free.  Encouraged her that she can do this without Chantix.  Will follow.

## 2020-06-05 NOTE — Patient Instructions (Signed)
Schedule your complete physical in Feb No med changes at this time- you're doing great! Keep up the good work on healthy diet and regular exercise- you're doing great! I am SO excited for you to quit smoking!  You can totally do it! Call with any questions or concerns HAPPY EARLY BIRTHDAY!!!!

## 2020-06-14 ENCOUNTER — Other Ambulatory Visit: Payer: Self-pay | Admitting: Family Medicine

## 2020-06-14 NOTE — Telephone Encounter (Signed)
Please advise on the chantix refill.

## 2020-07-21 ENCOUNTER — Other Ambulatory Visit: Payer: Self-pay | Admitting: Family Medicine

## 2020-07-26 NOTE — Progress Notes (Signed)
Alexis Wood  [phone] [phone]     ID: TAETUM FLEWELLEN DOB: November 03, 1955  MR#: [id number]  VZC#:[id number]  Patient Care Team: Midge Minium, MD as PCP - General (Family Medicine) Arvella Nigh, MD as Consulting Physician (Obstetrics and Gynecology) Erroll Luna, MD as Consulting Physician (General Surgery) Haig Gerardo, Virgie Dad, MD as Consulting Physician (Oncology) Wonda Horner, MD as Consulting Physician (Gastroenterology) OTHER MD:  CHIEF COMPLAINT: Atypical ductal hyperplasia  CURRENT TREATMENT: Anastrozole; intensified screening   INTERVAL HISTORY: Whittney returns today for follow up for high risk of breast cancer.  Faith continues on Anastrozole.  She has occasional hot flashes.  She does not feel she needs any intervention regarding that.  Her most recent bone density screening from 07/16/2019 showed a T-score of -0.9, which is considered normal.  Since her last visit, she underwent bilateral breast MRI on 02/18/2020, which showed: breast composition B; no evidence of malignancy.  She is overdue for screening mammography, most recently on 07/16/2019 at [facility].   REVIEW OF SYSTEMS: Tiah is now retired.  She is doing yard work.  She is not walking regularly but is planning to.  She was having significant problems with lichen sclerosis but found that emu oil is working for her.  Aside from these issues a detailed review of systems today was stable   COVID 19 VACCINATION STATUS: [place] x2, most recently 01/10/2020   HISTORY OF CURRENT ILLNESS: From the original intake note:  Svetlana had routine screening mammography June 08, 2015 showing a possible abnormality in the left breast.  On 06/19/2015 she underwent left diagnostic mammography with tomography and left breast ultrasonography, showing the breast density to be category C.  In the left breast upper outer quadrant there was a persistent area of distortion, with a  second area approximately 3.5 cm medially to the first.  Accordingly on June 29, 2017 she underwent biopsy of the 2 left breast areas in question, but one in the upper inner quadrant and one in the upper outer quadrant, both showing complex sclerosing lesions.  ([id number]).  The patient then met with Dr. Brantley Stage and after appropriate discussion on September 05, 2015 proceeded to left double lumpectomy.  The pathology from this procedure ([id number]) showed in the more lateral lesion a complex [place] lesion with atypical ductal hyperplasia.  The more medial lesion showed a complex sclerosing lesion without atypia.  The patient is now referred for discussion of her risk of developing breast cancer in the future  Her subsequent history is as detailed below.   PAST MEDICAL HISTORY: Past Medical History:  Diagnosis Date  . Anxiety   . Arthritis    hands  . Dental crowns present   . Hypertension    states under control with med., has been on med. "several years"  . Hypothyroidism   . Macular degeneration   . Mass of right breast 01/2018    PAST SURGICAL HISTORY: Past Surgical History:  Procedure Laterality Date  . BREAST LUMPECTOMY WITH NEEDLE LOCALIZATION Left 09/05/2015   Procedure: LEFT BREAST WIRE LOCALIZED  LUMPECTOMY TIMES 2;  Surgeon: Erroll Luna, MD;  Location: [facility];  Service: General;  Laterality: Left;  . BREAST LUMPECTOMY WITH RADIOACTIVE SEED LOCALIZATION Right 02/05/2018   Procedure: RIGHT BREAST LUMPECTOMY WITH RADIOACTIVE SEED LOCALIZATION;  Surgeon: Erroll Luna, MD;  Location: [facility];  Service: General;  Laterality: Right;  . cataract Bilateral   . DILATATION & CURRETTAGE/HYSTEROSCOPY WITH  RESECTOCOPE  02/21/2006  . THYROIDECTOMY  Age 64    FAMILY HISTORY Family History  Problem Relation Age of Onset  . Coronary artery disease Mother   . Hypertension Mother   . Dementia Mother   . Coronary artery disease Father    . Hypertension Father   . Diabetes Father   . Breast cancer Maternal Aunt   . Diabetes type I Other   Patient's father passed away in 11-18-13 at age 36 from a massive heart attack. He had a pacemaker. Patient's mother passed away in May 18, 2017 at age 77. She had dementia. The patient only has one sister. Her mother had three sisters and one of them had breast cancer when she was in her 72's.    GYNECOLOGIC HISTORY:  Patient's last menstrual period was 03/09/2004. Menarche at 64 years old. GXP1, First carried to term at 64 years old. Was on birth control pills for about 14 years with no issues. Menopause in her 64's with no hormone replacement.   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 She lives at home with her husband, Clair Gulling, who is retired with disabilities. Her only daughter, Caryl Pina, lives in [place], [place] and does research for [employer].  She is not married. The patient has no grandchildren.  The patient is not a Banker.    ADVANCED DIRECTIVES: In the absence of any documents to the contrary the patient's husband is her healthcare power of attorney   HEALTH MAINTENANCE: Social History   Tobacco Use  . Smoking status: Former Smoker    Packs/day: 0.00    Types: Cigarettes    Quit date: 01/14/2020    Years since quitting: 0.5  . Smokeless tobacco: Never Used  Vaping Use  . Vaping Use: Never used  Substance Use Topics  . Alcohol use: No  . Drug use: No     Colonoscopy:  PAP:  Bone density: 06/2019, -0.9   Allergies  Allergen Reactions  . Penicillins Hives  . Pristiq [Desvenlafaxine] Hives    Current Outpatient Medications  Medication Sig Dispense Refill  . ALPRAZolam (XANAX) 1 MG tablet TAKE 1 TABLET (1 MG TOTAL) BY MOUTH 2 (TWO) TIMES DAILY AS NEEDED. FOR ANXIETY 60 tablet 3  . anastrozole (ARIMIDEX) 1 MG tablet Take 1 tablet (1 mg total) by mouth daily. 90 tablet 4  .  buPROPion (WELLBUTRIN XL) 300 MG 24 hr tablet TAKE 1 TABLET BY MOUTH DAILY 30 tablet 4  . levothyroxine (SYNTHROID) 112 MCG tablet TAKE 1 TABLET BY MOUTH EVERY DAY 30 tablet 3  . losartan (COZAAR) 100 MG tablet TAKE 1 TABLET BY MOUTH EVERY DAY 30 tablet 3  . Melatonin 10 MG CAPS Take by mouth.    . Multiple Vitamins-Minerals (PRESERVISION AREDS 2) CAPS Take by mouth.    . sertraline (ZOLOFT) 100 MG tablet TAKE 1 TABLET BY MOUTH EVERY DAY 30 tablet 3  . Vitamin D, Ergocalciferol, 2000 units CAPS Take by mouth.     No current facility-administered medications for this visit.    OBJECTIVE: white woman who appears well  Vitals:   07/27/20 0927  BP: (!) 138/91  Pulse: 96  Resp: 18  Temp: 98 F (36.7 C)  SpO2: 96%     Body mass index is 29.02 kg/m.   Wt Readings from Last 3 Encounters:  07/27/20 163 lb 12.8 oz (74.3 kg)  06/05/20 166 lb 4 oz (75.4 kg)  04/11/20  173 lb 6 oz (78.6 kg)      ECOG FS:1 - Symptomatic but completely ambulatory  Sclerae unicteric, EOMs intact Wearing a mask No cervical or supraclavicular adenopathy Lungs no rales or rhonchi Heart regular rate and rhythm Abd soft, nontender, positive bowel sounds MSK no focal spinal tenderness, no upper extremity lymphedema Neuro: nonfocal, well oriented, appropriate affect Breasts: I do not palpate any masses.  There are no skin or nipple changes of concern.  Both axillae are benign.   LAB RESULTS:  CMP     Component Value Date/Time   NA 138 04/11/2020 0909   K 4.4 04/11/2020 0909   CL 101 04/11/2020 0909   CO2 28 04/11/2020 0909   GLUCOSE 109 (H) 04/11/2020 0909   GLUCOSE 66 10/12/2009 0000   BUN 18 04/11/2020 0909   CREATININE 0.94 04/11/2020 0909   CREATININE 0.94 02/07/2017 1542   CALCIUM 9.7 04/11/2020 0909   PROT 7.2 04/11/2020 0909   ALBUMIN 4.6 04/11/2020 0909   AST 23 04/11/2020 0909   ALT 31 04/11/2020 0909   ALKPHOS 87 04/11/2020 0909   BILITOT 0.5 04/11/2020 0909   GFRNONAA 76.20  03/30/2010 0000    Lab Results  Component Value Date   WBC 10.9 (H) 04/11/2020   NEUTROABS 5.5 04/11/2020   HGB 14.8 04/11/2020   HCT 44.3 04/11/2020   MCV 88.9 04/11/2020   PLT 219.0 04/11/2020    No results found for: LABCA2  No components found for: [id number]  No results for input(s): INR in the last 168 hours.  No results found for: LABCA2  No results found for: MLJ449  No results found for: EEF007  No results found for: HQR975  No results found for: CA2729  No components found for: HGQUANT  No results found for: CEA1 / No results found for: CEA1   No results found for: AFPTUMOR  No results found for: CHROMOGRNA  No results found for: TOTALPROTELP, ALBUMINELP, A1GS, A2GS, BETS, BETA2SER, GAMS, MSPIKE, SPEI (this displays SPEP labs)  No results found for: KPAFRELGTCHN, LAMBDASER, KAPLAMBRATIO (kappa/lambda light chains)  No results found for: HGBA, HGBA2QUANT, HGBFQUANT, HGBSQUAN (Hemoglobinopathy evaluation)   No results found for: LDH  No results found for: IRON, TIBC, IRONPCTSAT (Iron and TIBC)  No results found for: FERRITIN  Urinalysis No results found for: COLORURINE, APPEARANCEUR, LABSPEC, PHURINE, GLUCOSEU, HGBUR, BILIRUBINUR, KETONESUR, PROTEINUR, UROBILINOGEN, NITRITE, LEUKOCYTESUR   STUDIES: No results found.   ELIGIBLE FOR AVAILABLE RESEARCH PROTOCOL: no  ASSESSMENT: 64 y.o. [place] woman status post left lumpectomy 09/05/2015 for atypical ductal hyperplasia  1. Anastrozole started in 07/2017  (a) bone density at lumbar 06/10/2016 reported as "normal".  (b) bone density 07-16-2019 shows a T score of -0.9 (normal).  2. Biopsy on 01/09/2018 consistent with a complex sclerosing lesion in the right upper outer quadrant  (a) right lumpectomy 02/05/2018 shows only the complex sclerosing lesion, no evidence of malignancy  3.  Intensified screening:  (a) breast MRI June 2020 and June 2021  (b) bilateral screening  mammography October 2020   PLAN: Lossie is now 3 years into her 5-year plan on anastrozole.  She is tolerating this well.  She will need a repeat bone density next year I have entered that order.  We discussed the fact that her breasts are low density and that she is taking anastrozole to reduce her risk of breast cancer.  Given all that I am comfortable stopping the breast MRI unless there is an abnormality in the mammography  that needs further elucidation.  She is very pleased with this decision.  I have encouraged her walking program  She will see me again in 1 year.  She knows to call for any other issue that may develop before then  Total encounter time 25 minutes.   Rey Dansby, Virgie Dad, MD  07/27/20 9:44 AM Medical Oncology and Hematology [facility] [place], [place] [address] Tel. [phone]    Fax. [phone]    I, Wilburn Mylar, am acting as scribe for Dr. Virgie Dad. Ayinde Swim.  I, Lurline Del MD, have reviewed the above documentation for accuracy and completeness, and I agree with the above.   *Total Encounter Time as defined by the Centers for Medicare and Medicaid Services includes, in addition to the face-to-face time of a patient visit (documented in the note above) non-face-to-face time: obtaining and reviewing outside history, ordering and reviewing medications, tests or procedures, care coordination (communications with other health care professionals or caregivers) and documentation in the medical record.

## 2020-07-27 ENCOUNTER — Inpatient Hospital Stay: Payer: 59 | Attending: Oncology | Admitting: Oncology

## 2020-07-27 ENCOUNTER — Other Ambulatory Visit: Payer: Self-pay

## 2020-07-27 VITALS — BP 138/91 | HR 96 | Temp 98.0°F | Resp 18 | Ht 63.0 in | Wt 163.8 lb

## 2020-07-27 DIAGNOSIS — I10 Essential (primary) hypertension: Secondary | ICD-10-CM | POA: Diagnosis not present

## 2020-07-27 DIAGNOSIS — E039 Hypothyroidism, unspecified: Secondary | ICD-10-CM | POA: Insufficient documentation

## 2020-07-27 DIAGNOSIS — Z79811 Long term (current) use of aromatase inhibitors: Secondary | ICD-10-CM | POA: Diagnosis not present

## 2020-07-27 DIAGNOSIS — Z79899 Other long term (current) drug therapy: Secondary | ICD-10-CM | POA: Insufficient documentation

## 2020-07-27 DIAGNOSIS — N6092 Unspecified benign mammary dysplasia of left breast: Secondary | ICD-10-CM | POA: Diagnosis present

## 2020-07-27 DIAGNOSIS — F419 Anxiety disorder, unspecified: Secondary | ICD-10-CM | POA: Insufficient documentation

## 2020-07-27 DIAGNOSIS — Z87891 Personal history of nicotine dependence: Secondary | ICD-10-CM | POA: Diagnosis not present

## 2020-08-01 ENCOUNTER — Telehealth: Payer: Self-pay | Admitting: Oncology

## 2020-08-01 NOTE — Telephone Encounter (Signed)
Scheduled appts per 11/11 los. Pt confirmed appt date and time.  

## 2020-08-02 ENCOUNTER — Encounter: Payer: Self-pay | Admitting: Family Medicine

## 2020-08-04 ENCOUNTER — Other Ambulatory Visit: Payer: Self-pay | Admitting: Oncology

## 2020-08-04 ENCOUNTER — Other Ambulatory Visit: Payer: Self-pay | Admitting: Family Medicine

## 2020-08-04 MED ORDER — VARENICLINE TARTRATE 1 MG PO TABS: 1.0000 mg | ORAL_TABLET | Freq: Two times a day (BID) | ORAL | 3 refills | Status: DC

## 2020-08-04 MED ORDER — VARENICLINE TARTRATE 0.5 MG X 11 & 1 MG X 42 PO MISC: ORAL | 0 refills | Status: DC

## 2020-08-07 ENCOUNTER — Encounter: Payer: Self-pay | Admitting: Family Medicine

## 2020-09-11 ENCOUNTER — Encounter (INDEPENDENT_AMBULATORY_CARE_PROVIDER_SITE_OTHER): Payer: 59 | Admitting: Ophthalmology

## 2020-09-19 ENCOUNTER — Encounter (INDEPENDENT_AMBULATORY_CARE_PROVIDER_SITE_OTHER): Payer: Self-pay | Admitting: Ophthalmology

## 2020-09-19 ENCOUNTER — Ambulatory Visit (INDEPENDENT_AMBULATORY_CARE_PROVIDER_SITE_OTHER): Payer: 59 | Admitting: Ophthalmology

## 2020-09-19 ENCOUNTER — Other Ambulatory Visit: Payer: Self-pay

## 2020-09-19 DIAGNOSIS — H353113 Nonexudative age-related macular degeneration, right eye, advanced atrophic without subfoveal involvement: Secondary | ICD-10-CM | POA: Diagnosis not present

## 2020-09-19 DIAGNOSIS — H353123 Nonexudative age-related macular degeneration, left eye, advanced atrophic without subfoveal involvement: Secondary | ICD-10-CM

## 2020-09-19 DIAGNOSIS — H353222 Exudative age-related macular degeneration, left eye, with inactive choroidal neovascularization: Secondary | ICD-10-CM

## 2020-09-19 NOTE — Assessment & Plan Note (Signed)
No signs of recurrent disease OS

## 2020-09-19 NOTE — Assessment & Plan Note (Signed)

## 2020-09-19 NOTE — Assessment & Plan Note (Signed)
Accounts for some acuity changes OD but no signs of CNVM

## 2020-09-19 NOTE — Progress Notes (Signed)
09/19/2020     CHIEF COMPLAINT Patient presents for Retina Follow Up (6 MO FU OU///Pt reports vision change w/ glasses (not happy w/ progressive), no new F/F OU, no pain or pressure OU. )   HISTORY OF PRESENT ILLNESS: Alexis Wood is a 65 y.o. 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  This started 6 months ago.  Severity is mild.  Duration of 6 months.  Since onset it is stable. Additional comments: 6 MO FU OU   Pt reports vision change w/ glasses (not happy w/ progressive), no new F/F OU, no pain or pressure OU.        Last edited by Varney Biles D on 09/19/2020  9:47 AM. (History)      Referring physician: Sheliah Hatch, MD [phone]  [place] [address]  HISTORICAL INFORMATION:   Selected notes from the MEDICAL RECORD NUMBER       CURRENT MEDICATIONS: No current outpatient medications on file. (Ophthalmic Drugs)   No current facility-administered medications for this visit. (Ophthalmic Drugs)   Current Outpatient Medications (Other)  Medication Sig  . ALPRAZolam (XANAX) 1 MG tablet TAKE 1 TABLET (1 MG TOTAL) BY MOUTH 2 (TWO) TIMES DAILY AS NEEDED. FOR ANXIETY  . anastrozole (ARIMIDEX) 1 MG tablet TAKE 1 TABLET BY MOUTH EVERY DAY  . buPROPion (WELLBUTRIN XL) 300 MG 24 hr tablet TAKE 1 TABLET BY MOUTH DAILY  . levothyroxine (SYNTHROID) 112 MCG tablet TAKE 1 TABLET BY MOUTH EVERY DAY  . losartan (COZAAR) 100 MG tablet TAKE 1 TABLET BY MOUTH EVERY DAY  . Melatonin 10 MG CAPS Take by mouth.  . Multiple Vitamins-Minerals (PRESERVISION AREDS 2) CAPS Take by mouth.  . sertraline (ZOLOFT) 100 MG tablet TAKE 1 TABLET BY MOUTH EVERY DAY  . varenicline (CHANTIX PAK) 0.5 MG X 11 & 1 MG X 42 tablet Take one 0.5 mg tablet by mouth once daily for 3 days, then increase to one 0.5 mg tablet twice daily for 4 days, then increase to one 1 mg tablet twice daily.  . varenicline  (CHANTIX) 1 MG tablet Take 1 tablet (1 mg total) by mouth 2 (two) times daily.  . Vitamin D, Ergocalciferol, 2000 units CAPS Take by mouth.   No current facility-administered medications for this visit. (Other)      REVIEW OF SYSTEMS:    ALLERGIES Allergies  Allergen Reactions  . Penicillins Hives  . Pristiq [Desvenlafaxine] Hives    PAST MEDICAL HISTORY Past Medical History:  Diagnosis Date  . Anxiety   . Arthritis    hands  . Dental crowns present   . Hypertension    states under control with med., has been on med. "several years"  . Hypothyroidism   . Macular degeneration   . Mass of right breast 01/2018   Past Surgical History:  Procedure Laterality Date  . BREAST LUMPECTOMY WITH NEEDLE LOCALIZATION Left 09/05/2015   Procedure: LEFT BREAST WIRE LOCALIZED  LUMPECTOMY TIMES 2;  Surgeon: Harriette Bouillon, MD;  Location: [facility];  Service: General;  Laterality: Left;  . BREAST LUMPECTOMY WITH RADIOACTIVE SEED LOCALIZATION Right 02/05/2018   Procedure: RIGHT BREAST LUMPECTOMY WITH RADIOACTIVE SEED LOCALIZATION;  Surgeon: Harriette Bouillon, MD;  Location: [facility];  Service: General;  Laterality: Right;  . cataract Bilateral   . DILATATION &  CURRETTAGE/HYSTEROSCOPY WITH RESECTOCOPE  02/21/2006  . THYROIDECTOMY  Age 6    FAMILY HISTORY Family History  Problem Relation Age of Onset  . Coronary artery disease Mother   . Hypertension Mother   . Dementia Mother   . Coronary artery disease Father   . Hypertension Father   . Diabetes Father   . Breast cancer Maternal Aunt   . Diabetes type I Other     SOCIAL HISTORY Social History   Tobacco Use  . Smoking status: Former Smoker    Packs/day: 0.00    Types: Cigarettes    Quit date: 01/14/2020    Years since quitting: 0.6  . Smokeless tobacco: Never Used  Vaping Use  . Vaping Use: Never used  Substance Use Topics  . Alcohol use: No  . Drug use: No         OPHTHALMIC  EXAM:  Base Eye Exam    Visual Acuity (ETDRS)      Right Left   Dist [place] 20/20 -1 20/50 +2   Dist ph [place]  20/30 +1       Tonometry (Tonopen, 9:52 AM)      Right Left   Pressure 12 14       Pupils      Pupils Dark Light Shape React APD   Right PERRL 4 3 Round Brisk None   Left PERRL 4 3 Round Brisk None       Visual Fields (Counting fingers)      Left Right    Full Full       Extraocular Movement      Right Left    Full Full       Neuro/Psych    Oriented x3: Yes   Mood/Affect: Normal       Dilation    Both eyes: 1.0% Mydriacyl, 2.5% Phenylephrine @ 9:52 AM        Slit Lamp and Fundus Exam    External Exam      Right Left   External Normal Normal       Slit Lamp Exam      Right Left   Lids/Lashes Normal Normal   Conjunctiva/Sclera White and quiet White and quiet   Cornea Clear Clear   Anterior Chamber Deep and quiet Deep and quiet   Iris Round and reactive Round and reactive   Lens Posterior chamber intraocular lens Posterior chamber intraocular lens   Anterior Vitreous Normal Normal       Fundus Exam      Right Left   Posterior Vitreous Posterior vitreous detachment Posterior vitreous detachment   Disc Peripapillary atrophy Peripapillary atrophy   C/D Ratio 0.15 0.15   Macula Retinal pigment epithelial mottling, Geographic atrophy, Early age related macular degeneration Retinal pigment epithelial mottling, Geographic atrophy, Early age related macular degeneration   Vessels Normal Normal   Periphery Normal Normal          IMAGING AND PROCEDURES  Imaging and Procedures for 09/19/20  OCT, Retina - OU - Both Eyes       Right Eye Quality was borderline. Scan locations included subfoveal. Central Foveal Thickness: 314. Progression has been stable. Findings include abnormal foveal contour, no SRF, no IRF, pigment epithelial detachment, central retinal atrophy, outer retinal atrophy.   Left Eye Quality was good. Scan locations included subfoveal.  Central Foveal Thickness: 298. Progression has improved. Findings include abnormal foveal contour, no IRF, no SRF, central retinal atrophy, pigment epithelial detachment.   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 Checking an Amsler Grid daily with advice to return immediately should a distortion develop, was given to the patient. The patient 's smoking status now and in the past was determined and advice based on the AREDS study was provided regarding the consumption of antioxidant supplements. AREDS 2 vitamin formulation was recommended. Consumption of dark leafy vegetables and fresh fruits of various colors was recommended. Treatment modalities for wet macular degeneration particularly the use of intravitreal injections of anti-blood vessel growth factors was discussed with the patient. Avastin, Lucentis, and Eylea are the available options. On occasion, therapy includes the use of photodynamic therapy and thermal laser. Stressed to the patient do not rub eyes.  Patient was advised to check Amsler Grid daily and return immediately if changes are noted. Instructions on using the grid were given to the patient. All patient questions were answered.  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 Advanced nonexudative  age-related macular degeneration of left eye without subfoveal involvement  H35.3123   3. Advanced nonexudative age-related macular degeneration of right eye without subfoveal involvement  H35.3113     1.  No signs of recurrent CNVM OS, no signs of CNVM OD.  2.  Discussed with the patient that progression of dry age-related macular degeneration can still affect the vision in acute future yet not at this time,  3.  Ophthalmic Meds Ordered this visit:  No orders of the defined types were placed in this encounter.      Return in about 6 months (around 03/19/2021) for DILATE OU, OCT.  There are no Patient Instructions on file for this visit.   Explained the diagnoses, plan, and follow up with the patient and they expressed understanding.  Patient expressed understanding of the importance of proper follow up care.   Clent Demark Floreine Kingdon M.D. Diseases & Surgery of the Retina and Vitreous [facility] 09/19/20     Abbreviations: M myopia (nearsighted); A astigmatism; H hyperopia (farsighted); P presbyopia; Mrx spectacle prescription;  CTL contact lenses; OD right eye; OS left eye; OU both eyes  XT exotropia; ET esotropia; PEK punctate epithelial keratitis; PEE punctate epithelial erosions; DES dry eye syndrome; MGD meibomian gland dysfunction; ATs artificial tears; PFAT's preservative free artificial tears; [place] nuclear sclerotic cataract; PSC posterior subcapsular cataract; ERM epi-retinal membrane; PVD posterior vitreous detachment; RD retinal detachment; DM diabetes mellitus; DR diabetic retinopathy; NPDR non-proliferative diabetic retinopathy; PDR proliferative diabetic retinopathy; CSME clinically significant macular edema; DME diabetic macular edema; dbh dot blot hemorrhages; CWS cotton wool spot; POAG primary open angle glaucoma; C/D cup-to-disc ratio; HVF humphrey visual field; GVF goldmann visual field; OCT optical coherence tomography; IOP intraocular pressure; BRVO Branch  retinal vein occlusion; CRVO central retinal vein occlusion; CRAO central retinal artery occlusion; BRAO branch retinal artery occlusion; RT retinal tear;  SB scleral buckle; PPV pars plana vitrectomy; VH Vitreous hemorrhage; PRP panretinal laser photocoagulation; IVK intravitreal kenalog; VMT vitreomacular traction; MH Macular hole;  NVD neovascularization of the disc; NVE neovascularization elsewhere; AREDS age related eye disease study; ARMD age related macular degeneration; POAG primary open angle glaucoma; EBMD epithelial/anterior basement membrane dystrophy; ACIOL anterior chamber intraocular lens; IOL intraocular lens; PCIOL posterior chamber intraocular lens; Phaco/IOL phacoemulsification with intraocular lens placement; PRK photorefractive keratectomy; LASIK laser assisted in situ keratomileusis; HTN hypertension; DM diabetes mellitus; COPD chronic obstructive pulmonary disease 

## 2020-10-03 ENCOUNTER — Other Ambulatory Visit: Payer: Self-pay | Admitting: Family Medicine

## 2020-10-18 ENCOUNTER — Other Ambulatory Visit: Payer: Self-pay | Admitting: Family Medicine

## 2020-10-18 NOTE — Telephone Encounter (Signed)
Requesting:Xanax 1mg  Contract: UDS: Last Visit:06/05/20 Next Visit:11/09/20 Last Refill:03/13/20 60 tabs 3 refills Please Advise

## 2020-10-31 ENCOUNTER — Other Ambulatory Visit: Payer: Self-pay | Admitting: Family Medicine

## 2020-11-09 ENCOUNTER — Other Ambulatory Visit: Payer: Self-pay

## 2020-11-09 ENCOUNTER — Encounter: Payer: Self-pay | Admitting: Family Medicine

## 2020-11-09 ENCOUNTER — Ambulatory Visit (INDEPENDENT_AMBULATORY_CARE_PROVIDER_SITE_OTHER): Payer: 59 | Admitting: Family Medicine

## 2020-11-09 VITALS — BP 120/80 | HR 105 | Temp 99.0°F | Resp 18 | Ht 64.0 in | Wt 163.8 lb

## 2020-11-09 DIAGNOSIS — Z23 Encounter for immunization: Secondary | ICD-10-CM

## 2020-11-09 DIAGNOSIS — E559 Vitamin D deficiency, unspecified: Secondary | ICD-10-CM | POA: Diagnosis not present

## 2020-11-09 DIAGNOSIS — I10 Essential (primary) hypertension: Secondary | ICD-10-CM

## 2020-11-09 DIAGNOSIS — Z0000 Encounter for general adult medical examination without abnormal findings: Secondary | ICD-10-CM | POA: Diagnosis not present

## 2020-11-09 LAB — BASIC METABOLIC PANEL
BUN: 18 mg/dL (ref 6–23)
CO2: 27 mEq/L (ref 19–32)
Calcium: 9.1 mg/dL (ref 8.4–10.5)
Chloride: 101 mEq/L (ref 96–112)
Creatinine, Ser: 1.01 mg/dL (ref 0.40–1.20)
GFR: 58.87 mL/min — ABNORMAL LOW (ref >60.00)
Glucose, Bld: 126 mg/dL — ABNORMAL HIGH (ref 70–99)
Potassium: 4.1 mEq/L (ref 3.5–5.1)
Sodium: 138 mEq/L (ref 135–145)

## 2020-11-09 LAB — CBC WITH DIFFERENTIAL/PLATELET
Basophils Absolute: 0.1 10*3/uL (ref 0.0–0.1)
Basophils Relative: 0.7 % (ref 0.0–3.0)
Eosinophils Absolute: 0.3 10*3/uL (ref 0.0–0.7)
Eosinophils Relative: 3.3 % (ref 0.0–5.0)
HCT: 43.1 % (ref 36.0–46.0)
Hemoglobin: 14.7 g/dL (ref 12.0–15.0)
Lymphocytes Relative: 30 % (ref 12.0–46.0)
Lymphs Abs: 3.1 10*3/uL (ref 0.7–4.0)
MCHC: 34.1 g/dL (ref 30.0–36.0)
MCV: 87.3 fl (ref 78.0–100.0)
Monocytes Absolute: 0.6 10*3/uL (ref 0.1–1.0)
Monocytes Relative: 6.2 % (ref 3.0–12.0)
Neutro Abs: 6.2 10*3/uL (ref 1.4–7.7)
Neutrophils Relative %: 59.8 % (ref 43.0–77.0)
Platelets: 216 10*3/uL (ref 150.0–400.0)
RBC: 4.94 Mil/uL (ref 3.87–5.11)
RDW: 13 % (ref 11.5–15.5)
WBC: 10.3 10*3/uL (ref 4.0–10.5)

## 2020-11-09 LAB — HEPATIC FUNCTION PANEL
ALT: 23 U/L (ref 0–35)
AST: 20 U/L (ref 0–37)
Albumin: 4.4 g/dL (ref 3.5–5.2)
Alkaline Phosphatase: 78 U/L (ref 39–117)
Bilirubin, Direct: 0 mg/dL (ref 0.0–0.3)
Total Bilirubin: 0.4 mg/dL (ref 0.2–1.2)
Total Protein: 7.3 g/dL (ref 6.0–8.3)

## 2020-11-09 LAB — LIPID PANEL
Cholesterol: 200 mg/dL (ref 0–200)
HDL: 74.9 mg/dL (ref >39.00)
NonHDL: 124.67
Total CHOL/HDL Ratio: 3
Triglycerides: 217 mg/dL — ABNORMAL HIGH (ref 0.0–149.0)
VLDL: 43.4 mg/dL — ABNORMAL HIGH (ref 0.0–40.0)

## 2020-11-09 LAB — LDL CHOLESTEROL, DIRECT: Direct LDL: 112 mg/dL

## 2020-11-09 LAB — TSH: TSH: 6.34 u[IU]/mL — ABNORMAL HIGH (ref 0.35–4.50)

## 2020-11-09 LAB — VITAMIN D 25 HYDROXY (VIT D DEFICIENCY, FRACTURES): VITD: 42.91 ng/mL (ref 30.00–100.00)

## 2020-11-09 MED ORDER — BUPROPION HCL ER (XL) 300 MG PO TB24: 300.0000 mg | ORAL_TABLET | Freq: Every day | ORAL | 3 refills | Status: DC

## 2020-11-09 NOTE — Assessment & Plan Note (Signed)
Pt has hx of similar.  Check labs and replete prn. °

## 2020-11-09 NOTE — Assessment & Plan Note (Signed)
Chronic problem.  Well controlled today on Losartan 100mg  daily.  Currently asymptomatic.  Check labs.  No anticipated med changes.

## 2020-11-09 NOTE — Patient Instructions (Signed)
Follow up in 6 months to recheck BP and weight loss progress °We'll notify you of your lab results and make any changes if needed °Continue to work on healthy diet and regular exercise- you can do it! °Call with any questions or concerns °Stay Safe!  Stay Healthy! °

## 2020-11-09 NOTE — Assessment & Plan Note (Signed)
Pt's PE WNL w/ exception of being overweight.  UTD on pap, DEXA, colonoscopy, mammo, immunizations.  Check labs.  Anticipatory guidance provided.

## 2020-11-09 NOTE — Progress Notes (Signed)
   Subjective:    Patient ID: Alexis Wood, female    DOB: 12/17/1955, 65 y.o.   MRN: [id number]  HPI CPE- UTD on pap, DEXA, colonoscopy, Tdap, flu, COVID.  Had mammo in October w/ Dr Arelia Sneddon.  Pt quit smoking 09/15/20  Reviewed past medical, surgical, family and social histories.   Patient Care Team    Relationship Specialty Notifications Start End  Sheliah Hatch, MD PCP - General Family Medicine  01/12/15   Richardean Chimera, MD Consulting Physician Obstetrics and Gynecology  07/28/15   Harriette Bouillon, MD Consulting Physician General Surgery  08/30/15   Magrinat, Valentino Hue, MD Consulting Physician Oncology  07/31/17   Graylin Shiver, MD Consulting Physician Gastroenterology  07/28/19     Health Maintenance  Topic Date Due  . MAMMOGRAM  07/15/2020  . COVID-19 Vaccine (3 - Booster for Pfizer series) 11/25/2020 (Originally 07/11/2020)  . HIV Screening  11/09/2021 (Originally 06/09/1971)  . PAP SMEAR-Modifier  07/13/2021  . DEXA SCAN  07/15/2021  . COLONOSCOPY (Pts 45-62yrs Insurance coverage will need to be confirmed)  07/29/2024  . TETANUS/TDAP  11/22/2028  . INFLUENZA VACCINE  Completed  . Hepatitis C Screening  Completed     Review of Systems Patient reports no vision/ hearing changes, adenopathy,fever, weight change,  persistant/recurrent hoarseness , swallowing issues, chest pain, palpitations, edema, persistant/recurrent cough, hemoptysis, dyspnea (rest/exertional/paroxysmal nocturnal), gastrointestinal bleeding (melena, rectal bleeding), abdominal pain, significant heartburn, bowel changes, GU symptoms (dysuria, hematuria, incontinence), Gyn symptoms (abnormal  bleeding, pain),  syncope, focal weakness, memory loss, numbness & tingling, skin/hair/nail changes, abnormal bruising or bleeding, anxiety, or depression.   This visit occurred during the SARS-CoV-2 public health emergency.  Safety protocols were in place, including screening questions prior to the visit, additional  usage of staff PPE, and extensive cleaning of exam room while observing appropriate contact time as indicated for disinfecting solutions.       Objective:   Physical Exam General Appearance:    Alert, cooperative, no distress, appears stated age  Head:    Normocephalic, without obvious abnormality, atraumatic  Eyes:    PERRL, conjunctiva/corneas clear, EOM's intact, fundi    benign, both eyes  Ears:    Normal TM's and external ear canals, both ears  Nose:   Deferred due to COVID  Throat:   Neck:   Supple, symmetrical, trachea midline, no adenopathy;    Thyroid: no enlargement/tenderness/nodules  Back:     Symmetric, no curvature, ROM normal, no CVA tenderness  Lungs:     Clear to auscultation bilaterally, respirations unlabored  Chest Wall:    No tenderness or deformity   Heart:    Regular rate and rhythm, S1 and S2 normal, no murmur, rub   or gallop  Breast Exam:    Deferred to GYN  Abdomen:     Soft, non-tender, bowel sounds active all four quadrants,    no masses, no organomegaly  Genitalia:    Deferred to GYN  Rectal:    Extremities:   Extremities normal, atraumatic, no cyanosis or edema  Pulses:   2+ and symmetric all extremities  Skin:   Skin color, texture, turgor normal, no rashes or lesions  Lymph nodes:   Cervical, supraclavicular, and axillary nodes normal  Neurologic:   CNII-XII intact, normal strength, sensation and reflexes    throughout          Assessment & Plan:

## 2020-11-10 ENCOUNTER — Other Ambulatory Visit (INDEPENDENT_AMBULATORY_CARE_PROVIDER_SITE_OTHER): Payer: 59

## 2020-11-10 ENCOUNTER — Other Ambulatory Visit: Payer: Self-pay

## 2020-11-10 DIAGNOSIS — E039 Hypothyroidism, unspecified: Secondary | ICD-10-CM

## 2020-11-10 DIAGNOSIS — R7309 Other abnormal glucose: Secondary | ICD-10-CM

## 2020-11-10 LAB — HEMOGLOBIN A1C: Hgb A1c MFr Bld: 5.9 % (ref 4.6–6.5)

## 2020-11-10 MED ORDER — LEVOTHYROXINE SODIUM 125 MCG PO TABS: 125.0000 ug | ORAL_TABLET | Freq: Every day | ORAL | 3 refills | Status: DC

## 2020-11-30 ENCOUNTER — Other Ambulatory Visit: Payer: Self-pay | Admitting: Family Medicine

## 2020-12-26 ENCOUNTER — Encounter: Payer: Self-pay | Admitting: Family Medicine

## 2020-12-27 MED ORDER — TIZANIDINE HCL 4 MG PO TABS: 4.0000 mg | ORAL_TABLET | Freq: Three times a day (TID) | ORAL | 0 refills | Status: DC | PRN

## 2021-02-04 ENCOUNTER — Other Ambulatory Visit: Payer: Self-pay | Admitting: Family Medicine

## 2021-02-04 DIAGNOSIS — I10 Essential (primary) hypertension: Secondary | ICD-10-CM

## 2021-02-05 ENCOUNTER — Encounter: Payer: Self-pay | Admitting: Family Medicine

## 2021-03-01 ENCOUNTER — Other Ambulatory Visit: Payer: Self-pay | Admitting: Family Medicine

## 2021-03-01 ENCOUNTER — Encounter: Payer: Self-pay | Admitting: Family Medicine

## 2021-03-14 ENCOUNTER — Encounter: Payer: Self-pay | Admitting: *Deleted

## 2021-03-20 ENCOUNTER — Encounter (INDEPENDENT_AMBULATORY_CARE_PROVIDER_SITE_OTHER): Payer: 59 | Admitting: Ophthalmology

## 2021-03-26 ENCOUNTER — Other Ambulatory Visit: Payer: Self-pay | Admitting: Family Medicine

## 2021-03-31 ENCOUNTER — Other Ambulatory Visit: Payer: Self-pay | Admitting: Family Medicine

## 2021-04-03 NOTE — Telephone Encounter (Signed)
Last filled 12/27/20 #30 with no refills Last visit 11/09/20 Next visit 06/20/21

## 2021-04-18 ENCOUNTER — Other Ambulatory Visit: Payer: Self-pay

## 2021-04-18 ENCOUNTER — Encounter (INDEPENDENT_AMBULATORY_CARE_PROVIDER_SITE_OTHER): Payer: Self-pay | Admitting: Ophthalmology

## 2021-04-18 ENCOUNTER — Ambulatory Visit (INDEPENDENT_AMBULATORY_CARE_PROVIDER_SITE_OTHER): Payer: 59 | Admitting: Ophthalmology

## 2021-04-18 DIAGNOSIS — H353123 Nonexudative age-related macular degeneration, left eye, advanced atrophic without subfoveal involvement: Secondary | ICD-10-CM | POA: Diagnosis not present

## 2021-04-18 DIAGNOSIS — H353222 Exudative age-related macular degeneration, left eye, with inactive choroidal neovascularization: Secondary | ICD-10-CM

## 2021-04-18 DIAGNOSIS — H43813 Vitreous degeneration, bilateral: Secondary | ICD-10-CM

## 2021-04-18 NOTE — Assessment & Plan Note (Signed)
No signs of recurrent CNVM 

## 2021-04-18 NOTE — Progress Notes (Signed)
04/18/2021     CHIEF COMPLAINT Patient presents for Retina Follow Up (6 month fu OU and OCT/Pt states VA OU stable since last visit. Pt denies FOL, floaters, or ocular pain OU. /)   HISTORY OF PRESENT ILLNESS: Alexis Wood is a 65 y.o. female who presents to the clinic today for:   HPI     Retina Follow Up           Diagnosis: Wet AMD   Laterality: both eyes   Onset: 6 months ago   Severity: mild   Duration: 6 months   Course: stable   Comments: 6 month fu OU and OCT Pt states VA OU stable since last visit. Pt denies FOL, floaters, or ocular pain OU.         Last edited by Demetrios Loll, COA on 04/18/2021  9:14 AM.      Referring physician: Sheliah Hatch, MD [address] [place],  [place] [address]  HISTORICAL INFORMATION:   Selected notes from the MEDICAL RECORD NUMBER    Lab Results  Component Value Date   HGBA1C 5.9 11/10/2020     CURRENT MEDICATIONS: No current outpatient medications on file. (Ophthalmic Drugs)   No current facility-administered medications for this visit. (Ophthalmic Drugs)   Current Outpatient Medications (Other)  Medication Sig   tiZANidine (ZANAFLEX) 4 MG tablet TAKE 1 TABLET (4 MG TOTAL) BY MOUTH EVERY 8 (EIGHT) HOURS AS NEEDED FOR MUSCLE SPASMS   ALPRAZolam (XANAX) 1 MG tablet TAKE 1 TABLET (1 MG TOTAL) BY MOUTH 2 (TWO) TIMES DAILY AS NEEDED. FOR ANXIETY   anastrozole (ARIMIDEX) 1 MG tablet TAKE 1 TABLET BY MOUTH EVERY DAY   buPROPion (WELLBUTRIN XL) 300 MG 24 hr tablet TAKE 1 TABLET BY MOUTH EVERY DAY   levothyroxine (SYNTHROID) 125 MCG tablet Take 1 tablet (125 mcg total) by mouth daily.   losartan (COZAAR) 100 MG tablet TAKE 1 TABLET BY MOUTH EVERY DAY   Melatonin 10 MG CAPS Take by mouth.   sertraline (ZOLOFT) 100 MG tablet TAKE 1 TABLET BY MOUTH EVERY DAY   varenicline (CHANTIX) 1 MG tablet Take 1 tablet (1 mg total) by mouth 2 (two) times daily.   Vitamin D, Ergocalciferol, 2000 units CAPS Take by mouth.    No current facility-administered medications for this visit. (Other)      REVIEW OF SYSTEMS:    ALLERGIES Allergies  Allergen Reactions   Penicillins Hives   Pristiq [Desvenlafaxine] Hives    PAST MEDICAL HISTORY Past Medical History:  Diagnosis Date   Anxiety    Arthritis    hands   Dental crowns present    Hypertension    states under control with med., has been on med. "several years"   Hypothyroidism    Intermediate stage nonexudative age-related macular degeneration of right eye 03/06/2020   The nature of age--related macular degeneration was discussed with the patient as well as the distinction between dry and wet types. Checking an Amsler Grid daily with advice to return immediately should a distortion develop, was given to the patient. The patient 's smoking status now and in the past was determined and advice based on the AREDS study was provided regarding the consumption of antio   Macular degeneration    Mass of right breast 01/2018   Past Surgical History:  Procedure Laterality Date   BREAST LUMPECTOMY WITH NEEDLE LOCALIZATION Left 09/05/2015   Procedure: LEFT BREAST WIRE LOCALIZED  LUMPECTOMY TIMES 2;  Surgeon: Harriette Bouillon, MD;  Location: [facility];  Service: General;  Laterality: Left;   BREAST LUMPECTOMY WITH RADIOACTIVE SEED LOCALIZATION Right 02/05/2018   Procedure: RIGHT BREAST LUMPECTOMY WITH RADIOACTIVE SEED LOCALIZATION;  Surgeon: Harriette Bouillon, MD;  Location: [facility];  Service: General;  Laterality: Right;   cataract Bilateral    DILATATION & CURRETTAGE/HYSTEROSCOPY WITH RESECTOCOPE  02/21/2006   THYROIDECTOMY  Age 67    FAMILY HISTORY Family History  Problem Relation Age of Onset   Coronary artery disease Mother    Hypertension Mother    Dementia Mother    Coronary artery disease Father    Hypertension Father    Diabetes Father    Breast cancer Maternal Aunt    Diabetes type I Other     SOCIAL  HISTORY Social History   Tobacco Use   Smoking status: Former    Packs/day: 0.00    Types: Cigarettes    Quit date: 01/14/2020    Years since quitting: 1.2   Smokeless tobacco: Never  Vaping Use   Vaping Use: Never used  Substance Use Topics   Alcohol use: No   Drug use: No         OPHTHALMIC EXAM:  Base Eye Exam     Visual Acuity (ETDRS)       Right Left   Dist cc 20/20 -2 20/25 -1    Correction: Glasses         Tonometry (Tonopen, 9:17 AM)       Right Left   Pressure 13 12         Pupils       Pupils Dark Light Shape React APD   Right PERRL 4 3 Round Brisk None   Left PERRL 4 3 Round Brisk None         Visual Fields (Counting fingers)       Left Right    Full Full         Extraocular Movement       Right Left    Full Full         Neuro/Psych     Oriented x3: Yes   Mood/Affect: Normal         Dilation     Both eyes: 1.0% Mydriacyl, 2.5% Phenylephrine @ 9:17 AM           Slit Lamp and Fundus Exam     External Exam       Right Left   External Normal Normal         Slit Lamp Exam       Right Left   Lids/Lashes Normal Normal   Conjunctiva/Sclera White and quiet White and quiet   Cornea Clear Clear   Anterior Chamber Deep and quiet Deep and quiet   Iris Round and reactive Round and reactive   Lens Posterior chamber intraocular lens Posterior chamber intraocular lens   Anterior Vitreous Normal Normal         Fundus Exam       Right Left   Posterior Vitreous Posterior vitreous detachment Posterior vitreous detachment   Disc Peripapillary atrophy Peripapillary atrophy   C/D Ratio 0.15 0.15   Macula Retinal pigment epithelial mottling, Geographic atrophy, Early age related macular degeneration Retinal pigment epithelial mottling, Geographic atrophy range the foveal avascular zone, small island of tissue remaining, Early age related macular degeneration   Vessels Normal Normal   Periphery Normal Normal  IMAGING AND PROCEDURES  Imaging and Procedures for 04/18/21  OCT, Retina - OU - Both Eyes       Right Eye Quality was borderline. Scan locations included subfoveal. Central Foveal Thickness: 321. Progression has been stable. Findings include abnormal foveal contour, no SRF, no IRF, pigment epithelial detachment, central retinal atrophy, outer retinal atrophy.   Left Eye Quality was good. Scan locations included subfoveal. Central Foveal Thickness: 286. Progression has improved. Findings include abnormal foveal contour, no IRF, no SRF, central retinal atrophy, pigment epithelial detachment.   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  An informational brochure was offered to the patient.  All the patient's questions were answered.  The patient was asked to return if new or different flashes or floaters develops.   Patient was instructed to contact office immediately if any new changes were noticed. I explained to the patient that vitreous inside the eye is similar to jello inside a bowl. As the jello melts it can start to pull away from the bowl, similarly the vitreous throughout our lives can begin to pull away from the retina. That process is called a posterior vitreous detachment. In some cases, the vitreous can tug hard enough on the retina to form a retinal tear. I discussed with the patient the signs and symptoms of a retinal detachment.   Do not rub the eye.       ICD-10-CM   1. 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 Advanced nonexudative age-related macular degeneration of left eye without subfoveal involvement  H35.3123     3. Posterior vitreous detachment of both eyes  H43.813       1.  no signs of recurrent CNVM OU.  Subretinal hyper reflective material in the fovea left eye with currently no impact on acuity.  2.  Geographic atrophy progressing with the aging process  3.  Ophthalmic Meds Ordered this visit:  No orders of the defined types were placed in this encounter.      Return in about 6 months (around 10/19/2021) for DILATE OU, OCT, COLOR FP.  There are no Patient Instructions on file for this visit.   Explained the diagnoses, plan, and follow up with the patient and they expressed understanding.  Patient expressed understanding of the importance of proper follow up care.   Alford HighlandGary A. Maryanne Huneycutt M.D. Diseases & Surgery of the Retina and Vitreous [facility] 04/18/21     Abbreviations: M myopia (nearsighted); A astigmatism; H hyperopia (farsighted); P presbyopia; Mrx spectacle prescription;  CTL contact lenses; OD right eye; OS left eye; OU both eyes  XT exotropia; ET esotropia; PEK punctate epithelial keratitis; PEE punctate epithelial erosions; DES dry eye syndrome; MGD meibomian gland dysfunction; ATs artificial tears; PFAT's preservative free artificial tears; NSC nuclear sclerotic cataract; PSC posterior subcapsular cataract; ERM epi-retinal membrane; PVD posterior vitreous detachment; RD retinal detachment; DM diabetes  mellitus; DR diabetic retinopathy; NPDR non-proliferative diabetic retinopathy; PDR proliferative diabetic retinopathy; CSME clinically significant macular edema; DME diabetic macular edema; dbh dot blot hemorrhages; CWS cotton wool spot; POAG primary open angle  glaucoma; C/D cup-to-disc ratio; HVF humphrey visual field; GVF goldmann visual field; OCT optical coherence tomography; IOP intraocular pressure; BRVO Branch retinal vein occlusion; CRVO central retinal vein occlusion; CRAO central retinal artery occlusion; BRAO branch retinal artery occlusion; RT retinal tear; SB scleral buckle; PPV pars plana vitrectomy; VH Vitreous hemorrhage; PRP panretinal laser photocoagulation; IVK intravitreal kenalog; VMT vitreomacular traction; MH Macular hole;  NVD neovascularization of the disc; NVE neovascularization elsewhere; AREDS age related eye disease study; ARMD age related macular degeneration; POAG primary open angle glaucoma; EBMD epithelial/anterior basement membrane dystrophy; ACIOL anterior chamber intraocular lens; IOL intraocular lens; PCIOL posterior chamber intraocular lens; Phaco/IOL phacoemulsification with intraocular lens placement; PRK photorefractive keratectomy; LASIK laser assisted in situ keratomileusis; HTN hypertension; DM diabetes mellitus; COPD chronic obstructive pulmonary disease

## 2021-04-18 NOTE — Assessment & Plan Note (Signed)

## 2021-04-18 NOTE — Assessment & Plan Note (Signed)
Geographic atrophy encroaching on the FAZ

## 2021-04-28 ENCOUNTER — Other Ambulatory Visit: Payer: Self-pay | Admitting: Family Medicine

## 2021-04-30 ENCOUNTER — Other Ambulatory Visit: Payer: Self-pay | Admitting: Family

## 2021-04-30 ENCOUNTER — Telehealth: Payer: Self-pay

## 2021-04-30 MED ORDER — ALPRAZOLAM 1 MG PO TABS: 1.0000 mg | ORAL_TABLET | Freq: Two times a day (BID) | ORAL | 3 refills | Status: DC | PRN

## 2021-04-30 NOTE — Telephone Encounter (Signed)
Requesting:Xanax  Contract: UDS: Last Visit:11/09/20 Next Visit:06/20/21 Last Refill:10/19/20 60 tabs 3 refills  Please Advise

## 2021-04-30 NOTE — Telephone Encounter (Signed)
Pt requesting Xanax 1mg  tab LOV: 11/09/2020. Next Visit: 06/20/5021 Last Refill: 02/23/2021  Please Advise.

## 2021-05-10 ENCOUNTER — Ambulatory Visit: Payer: 59 | Admitting: Family Medicine

## 2021-05-29 ENCOUNTER — Other Ambulatory Visit: Payer: Self-pay | Admitting: Family Medicine

## 2021-06-05 ENCOUNTER — Other Ambulatory Visit: Payer: Self-pay | Admitting: Family Medicine

## 2021-06-05 DIAGNOSIS — I10 Essential (primary) hypertension: Secondary | ICD-10-CM

## 2021-06-20 ENCOUNTER — Ambulatory Visit: Payer: 59 | Admitting: Family Medicine

## 2021-06-24 ENCOUNTER — Other Ambulatory Visit: Payer: Self-pay | Admitting: Family Medicine

## 2021-07-02 ENCOUNTER — Other Ambulatory Visit: Payer: Self-pay

## 2021-07-02 ENCOUNTER — Encounter: Payer: Self-pay | Admitting: Family Medicine

## 2021-07-02 ENCOUNTER — Ambulatory Visit (INDEPENDENT_AMBULATORY_CARE_PROVIDER_SITE_OTHER): Payer: Medicare Other | Admitting: Family Medicine

## 2021-07-02 VITALS — BP 128/88 | HR 87 | Temp 97.7°F | Resp 16 | Wt 160.4 lb

## 2021-07-02 DIAGNOSIS — Z23 Encounter for immunization: Secondary | ICD-10-CM

## 2021-07-02 DIAGNOSIS — E039 Hypothyroidism, unspecified: Secondary | ICD-10-CM | POA: Diagnosis not present

## 2021-07-02 DIAGNOSIS — F17200 Nicotine dependence, unspecified, uncomplicated: Secondary | ICD-10-CM

## 2021-07-02 DIAGNOSIS — I10 Essential (primary) hypertension: Secondary | ICD-10-CM

## 2021-07-02 DIAGNOSIS — E663 Overweight: Secondary | ICD-10-CM | POA: Insufficient documentation

## 2021-07-02 LAB — HEPATIC FUNCTION PANEL
ALT: 21 U/L (ref 0–35)
AST: 19 U/L (ref 0–37)
Albumin: 4.6 g/dL (ref 3.5–5.2)
Alkaline Phosphatase: 83 U/L (ref 39–117)
Bilirubin, Direct: 0.1 mg/dL (ref 0.0–0.3)
Total Bilirubin: 0.4 mg/dL (ref 0.2–1.2)
Total Protein: 7 g/dL (ref 6.0–8.3)

## 2021-07-02 LAB — CBC WITH DIFFERENTIAL/PLATELET
Basophils Absolute: 0.1 10*3/uL (ref 0.0–0.1)
Basophils Relative: 0.7 % (ref 0.0–3.0)
Eosinophils Absolute: 0.4 10*3/uL (ref 0.0–0.7)
Eosinophils Relative: 4.1 % (ref 0.0–5.0)
HCT: 45 % (ref 36.0–46.0)
Hemoglobin: 14.7 g/dL (ref 12.0–15.0)
Lymphocytes Relative: 34.3 % (ref 12.0–46.0)
Lymphs Abs: 3.5 10*3/uL (ref 0.7–4.0)
MCHC: 32.6 g/dL (ref 30.0–36.0)
MCV: 88.1 fl (ref 78.0–100.0)
Monocytes Absolute: 0.5 10*3/uL (ref 0.1–1.0)
Monocytes Relative: 5.1 % (ref 3.0–12.0)
Neutro Abs: 5.7 10*3/uL (ref 1.4–7.7)
Neutrophils Relative %: 55.8 % (ref 43.0–77.0)
Platelets: 191 10*3/uL (ref 150.0–400.0)
RBC: 5.11 Mil/uL (ref 3.87–5.11)
RDW: 13 % (ref 11.5–15.5)
WBC: 10.1 10*3/uL (ref 4.0–10.5)

## 2021-07-02 LAB — LIPID PANEL
Cholesterol: 214 mg/dL — ABNORMAL HIGH (ref 0–200)
HDL: 72.9 mg/dL (ref >39.00)
LDL Cholesterol: 106 mg/dL — ABNORMAL HIGH (ref 0–99)
NonHDL: 140.98
Total CHOL/HDL Ratio: 3
Triglycerides: 174 mg/dL — ABNORMAL HIGH (ref 0.0–149.0)
VLDL: 34.8 mg/dL (ref 0.0–40.0)

## 2021-07-02 LAB — BASIC METABOLIC PANEL
BUN: 25 mg/dL — ABNORMAL HIGH (ref 6–23)
CO2: 25 mEq/L (ref 19–32)
Calcium: 9.4 mg/dL (ref 8.4–10.5)
Chloride: 101 mEq/L (ref 96–112)
Creatinine, Ser: 1.02 mg/dL (ref 0.40–1.20)
GFR: 57.91 mL/min — ABNORMAL LOW (ref >60.00)
Glucose, Bld: 78 mg/dL (ref 70–99)
Potassium: 4.4 mEq/L (ref 3.5–5.1)
Sodium: 137 mEq/L (ref 135–145)

## 2021-07-02 LAB — TSH: TSH: 0.65 u[IU]/mL (ref 0.35–5.50)

## 2021-07-02 NOTE — Assessment & Plan Note (Addendum)
Chronic problem, adequate control on Losartan 100mg  daily.  Check labs due to ARB but no anticipated med changes.

## 2021-07-02 NOTE — Assessment & Plan Note (Signed)
Pt is down 5 lbs since last visit.  Applauded her efforts and encouraged her to continue.  [facility]  I think this would be a great idea.

## 2021-07-02 NOTE — Patient Instructions (Signed)
Schedule your Welcome To Medicare Physical in 6 months We'll notify you of your lab results and make any changes if needed Continue to work on healthy diet and regular exercise- you're doing great! Call with any questions or concerns Stay Safe!  Stay Healthy! I'm SO proud of you!!

## 2021-07-02 NOTE — Assessment & Plan Note (Signed)
+   low energy.  Check labs.  Adjust meds prn

## 2021-07-02 NOTE — Progress Notes (Signed)
   Subjective:    Patient ID: Alexis Wood, female    DOB: 29-Jun-1956, 65 y.o.   MRN: [id number]  HPI HTN- chronic problem, on Losartan 100mg  daily w/ adequate control.  No CP, SOB, HAs, visual changes, edema.  Hypothyroid- currently on Levothyroxine daily.  Pt reports low energy level.  No changes to skin/hair/nails.  Obesity- pt is down 5 lbs since last visit.  BMI is now 27.53   Review of Systems For ROS see HPI   This visit occurred during the SARS-CoV-2 public health emergency.  Safety protocols were in place, including screening questions prior to the visit, additional usage of staff PPE, and extensive cleaning of exam room while observing appropriate contact time as indicated for disinfecting solutions.      Objective:   Physical Exam Vitals reviewed.  Constitutional:      General: She is not in acute distress.    Appearance: Normal appearance. She is well-developed. She is not ill-appearing.  HENT:     Head: Normocephalic and atraumatic.  Eyes:     Conjunctiva/sclera: Conjunctivae normal.     Pupils: Pupils are equal, round, and reactive to light.  Neck:     Thyroid: No thyromegaly.  Cardiovascular:     Rate and Rhythm: Normal rate and regular rhythm.     Pulses: Normal pulses.     Heart sounds: Normal heart sounds. No murmur heard. Pulmonary:     Effort: Pulmonary effort is normal. No respiratory distress.     Breath sounds: Normal breath sounds.  Abdominal:     General: There is no distension.     Palpations: Abdomen is soft.     Tenderness: There is no abdominal tenderness.  Musculoskeletal:     Cervical back: Normal range of motion and neck supple.     Right lower leg: No edema.     Left lower leg: No edema.  Lymphadenopathy:     Cervical: No cervical adenopathy.  Skin:    General: Skin is warm and dry.  Neurological:     Mental Status: She is alert and oriented to person, place, and time.  Psychiatric:        Behavior: Behavior normal.           Assessment & Plan:

## 2021-07-02 NOTE — Assessment & Plan Note (Signed)
Pt has QUIT!!!  Last cigarette on 09/15/20

## 2021-07-04 ENCOUNTER — Telehealth: Payer: Self-pay

## 2021-07-04 NOTE — Telephone Encounter (Signed)
Correction patient would like lab results and medication refill on thyroid medication.

## 2021-07-04 NOTE — Telephone Encounter (Signed)
Patient dropped off form upfront FYI.

## 2021-07-04 NOTE — Telephone Encounter (Signed)
Caller name:Jenella Guyton   On DPR? :Yes  Call back [phone]  Provider they see: Beverely Low   Reason for call:Pt is wanting to talk to someone about the result of there thyroid labs she has taken her last thyroid pill today and she is wanting to see if her medication was going to change? Pt also wants a hard copy of her lab results as well.

## 2021-07-05 ENCOUNTER — Other Ambulatory Visit: Payer: Self-pay

## 2021-07-05 DIAGNOSIS — E039 Hypothyroidism, unspecified: Secondary | ICD-10-CM

## 2021-07-05 MED ORDER — LEVOTHYROXINE SODIUM 125 MCG PO TABS: 125.0000 ug | ORAL_TABLET | Freq: Every day | ORAL | 3 refills | Status: DC

## 2021-07-05 NOTE — Telephone Encounter (Signed)
Thyroid labs were normal.  No change to medication dose.  Refills were sent.

## 2021-07-05 NOTE — Telephone Encounter (Signed)
Communicated via mychart

## 2021-07-05 NOTE — Telephone Encounter (Signed)
Refill was sent in, please advise about lab results

## 2021-07-09 ENCOUNTER — Telehealth: Payer: Self-pay

## 2021-07-09 NOTE — Telephone Encounter (Signed)
Caller name:Marleni Marro   On DPR? :Yes  Call back number:[phone]  Provider they see: Beverely Low   Reason for call:Pt is calling she received her lab work and has questions about her cholesterol numbers pt is unable to get into her my chart and would like to be called

## 2021-07-09 NOTE — Telephone Encounter (Signed)
Spoke with pt about cholesterol labs and informed her that the levels are not high enough for Dr Beverely Low to be concerned . Pt voiced understanding

## 2021-07-16 ENCOUNTER — Other Ambulatory Visit: Payer: Self-pay

## 2021-07-16 ENCOUNTER — Ambulatory Visit
Admission: RE | Admit: 2021-07-16 | Discharge: 2021-07-16 | Disposition: A | Discharge status: Home / Self Care | Payer: Medicare Other | Source: Ambulatory Visit | Attending: Oncology | Admitting: Oncology

## 2021-07-16 DIAGNOSIS — N6092 Unspecified benign mammary dysplasia of left breast: Secondary | ICD-10-CM

## 2021-07-21 ENCOUNTER — Other Ambulatory Visit: Payer: Self-pay | Admitting: Family Medicine

## 2021-07-21 DIAGNOSIS — I10 Essential (primary) hypertension: Secondary | ICD-10-CM

## 2021-07-25 ENCOUNTER — Other Ambulatory Visit: Payer: Self-pay | Admitting: Family Medicine

## 2021-08-05 NOTE — Progress Notes (Signed)
[place]  [phone] [phone]) [phone]     ID: REGHAN THUL DOB: 01/31/1956  MR#: [id number]  JQG#:[id number]  Patient Care Team: Midge Minium, MD as PCP - General (Family Medicine) Arvella Nigh, MD as Consulting Physician (Obstetrics and Gynecology) Erroll Luna, MD as Consulting Physician (General Surgery) Jaxden Blyden, Virgie Dad, MD as Consulting Physician (Oncology) Wonda Horner, MD as Consulting Physician (Gastroenterology) OTHER MD:  CHIEF COMPLAINT: Atypical ductal hyperplasia  CURRENT TREATMENT: Anastrozole; intensified screening   INTERVAL HISTORY: Alexis Wood returns today for follow up for high risk of breast cancer.  Alexis Wood continues on Anastrozole.  She has hot flashes and vaginal dryness but does not feel either of those symptoms to require intervention.    Since her last visit, she underwent bone density screening on 07/16/2021 showing a T-score of -1.1, which is minimally osteopenic. This is slightly worsened from -0.9 in 06/2019.  She also underwent bilateral screening mammography with tomography at [facility] on the same day showing: breast density category B; no evidence of malignancy in either breast.  Her last breast MRI was 02/18/2020.    REVIEW OF SYSTEMS: Alexis Wood is not exercising regularly.  She admits that there is no real reason for this and she is just not motivated.  A detailed review of systems today was otherwise stable   COVID 19 VACCINATION STATUS: [place] x2, as of November 2022.   HISTORY OF CURRENT ILLNESS: From the original intake note:  Alexis Wood had routine screening mammography June 08, 2015 showing a possible abnormality in the left breast.  On 06/19/2015 she underwent left diagnostic mammography with tomography and left breast ultrasonography, showing the breast density to be category C.  In the left breast upper outer quadrant there was a persistent area of distortion, with a second area  approximately 3.5 cm medially to the first.  Accordingly on June 29, 2017 she underwent biopsy of the 2 left breast areas in question, but one in the upper inner quadrant and one in the upper outer quadrant, both showing complex sclerosing lesions.  ([id number]).  The patient then met with Dr. Brantley Stage and after appropriate discussion on September 05, 2015 proceeded to left double lumpectomy.  The pathology from this procedure ([id number]) showed in the more lateral Wood a complex Alexis Wood with atypical ductal hyperplasia.  The more medial Wood showed a complex sclerosing Wood without atypia.  The patient is now referred for discussion of her risk of developing breast cancer in the future  Her subsequent history is as detailed below.   PAST MEDICAL HISTORY: Past Medical History:  Diagnosis Date   Anxiety    Arthritis    hands   Dental crowns present    Hypertension    states under control with med., has been on med. "several years"   Hypothyroidism    Intermediate stage nonexudative age-related macular degeneration of right eye 03/06/2020   The nature of age--related macular degeneration was discussed with the patient as well as the distinction between dry and wet types. Checking an Amsler Grid daily with advice to return immediately should a distortion develop, was given to the patient. The patient 's smoking status now and in the past was determined and advice based on the AREDS study was provided regarding the consumption of antio   Macular degeneration    Mass of right breast 01/2018    PAST SURGICAL HISTORY: Past Surgical History:  Procedure Laterality Date   BREAST LUMPECTOMY WITH NEEDLE LOCALIZATION  Left 09/05/2015   Procedure: LEFT BREAST WIRE LOCALIZED  LUMPECTOMY TIMES 2;  Surgeon: Erroll Luna, MD;  Location: [facility];  Service: General;  Laterality: Left;   BREAST LUMPECTOMY WITH RADIOACTIVE SEED LOCALIZATION Right 02/05/2018   Procedure:  RIGHT BREAST LUMPECTOMY WITH RADIOACTIVE SEED LOCALIZATION;  Surgeon: Erroll Luna, MD;  Location: [facility];  Service: General;  Laterality: Right;   cataract Bilateral    DILATATION & CURRETTAGE/HYSTEROSCOPY WITH RESECTOCOPE  02/21/2006   THYROIDECTOMY  Age 46    FAMILY HISTORY Family History  Problem Relation Age of Onset   Coronary artery disease Mother    Hypertension Mother    Dementia Mother    Coronary artery disease Father    Hypertension Father    Diabetes Father    Breast cancer Maternal Aunt    Diabetes type I Other   Patient's father passed away in 11/01/13 at age 54 from a massive heart attack. He had a pacemaker. Patient's mother passed away in 05-01-17 at age 9. She had dementia. The patient only has one sister. Her mother had three sisters and one of them had breast cancer when she was in her 35's.    GYNECOLOGIC HISTORY:  Patient's last menstrual period was 03/09/2004. Menarche at 65 years old. GXP1, First carried to term at 65 years old. Was on birth control pills for about 14 years with no issues. Menopause in her 45's with no hormone replacement.   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 She lives at home with her husband, Alexis Wood, who is retired with disabilities. Her only daughter, Alexis Wood, lives in [place], [place] and does research for [employer].  She is not married. The patient has no grandchildren.  The patient is not a Banker.    ADVANCED DIRECTIVES: In the absence of any documents to the contrary the patient's husband is her healthcare power of attorney   HEALTH MAINTENANCE: Social History   Tobacco Use   Smoking status: Former    Packs/day: 0.00    Types: Cigarettes    Quit date: 01/14/2020    Years since quitting: 1.5   Smokeless tobacco: Never  Vaping Use   Vaping Use: Never used  Substance Use Topics   Alcohol use: No   Drug use: No      Colonoscopy:  PAP:  Bone density: 06/2019, -0.9   Allergies  Allergen Reactions   Penicillins Hives   Pristiq [Desvenlafaxine] Hives    Current Outpatient Medications  Medication Sig Dispense Refill   tiZANidine (ZANAFLEX) 4 MG tablet TAKE 1 TABLET (4 MG TOTAL) BY MOUTH EVERY 8 (EIGHT) HOURS AS NEEDED FOR MUSCLE SPASMS (Patient not taking: Reported on 07/02/2021) 30 tablet 0   ALPRAZolam (XANAX) 1 MG tablet TAKE 1 TABLET (1 MG TOTAL) BY MOUTH 2 (TWO) TIMES DAILY AS NEEDED. FOR ANXIETY 60 tablet 0   ALPRAZolam (XANAX) 1 MG tablet Take 1 tablet (1 mg total) by mouth 2 (two) times daily as needed. for anxiety (Patient not taking: Reported on 07/02/2021) 60 tablet 3   anastrozole (ARIMIDEX) 1 MG tablet TAKE 1 TABLET BY MOUTH EVERY DAY 90 tablet 4   buPROPion (WELLBUTRIN XL) 300 MG 24 hr tablet TAKE 1 TABLET BY MOUTH EVERY DAY 90 tablet 1   levothyroxine (SYNTHROID) 125 MCG tablet Take 1 tablet (125 mcg total) by mouth daily. 90 tablet 3   losartan (COZAAR) 100 MG tablet TAKE  1 TABLET BY MOUTH EVERY DAY 30 tablet 0   Melatonin 10 MG CAPS Take by mouth.     sertraline (ZOLOFT) 100 MG tablet TAKE 1 TABLET BY MOUTH EVERY DAY 30 tablet 3   varenicline (CHANTIX) 1 MG tablet Take 1 tablet (1 mg total) by mouth 2 (two) times daily. (Patient not taking: Reported on 07/02/2021) 60 tablet 3   Vitamin D, Ergocalciferol, 2000 units CAPS Take by mouth.     No current facility-administered medications for this visit.    OBJECTIVE: white woman in no acute distress  Vitals:   08/06/21 1118  BP: (!) 144/77  Pulse: 88  Resp: 18  Temp: 98.6 F (37 C)  SpO2: 98%      Body mass index is 27.94 kg/m.   Wt Readings from Last 3 Encounters:  08/06/21 162 lb 12.8 oz (73.8 kg)  07/02/21 160 lb 6.4 oz (72.8 kg)  11/09/20 163 lb 12.8 oz (74.3 kg)     ECOG FS:1 - Symptomatic but completely ambulatory  Sclerae unicteric, EOMs intact Wearing a mask No cervical or supraclavicular adenopathy Lungs  no rales or rhonchi Heart regular rate and rhythm Abd soft, nontender, positive bowel sounds MSK no focal spinal tenderness, no upper extremity lymphedema Neuro: nonfocal, well oriented, appropriate affect Breasts: I do not palpate any suspicious masses in either breast.  There are there are no skin or nipple changes of concern.  Both axillae are benign.   LAB RESULTS:  CMP     Component Value Date/Time   NA 137 07/02/2021 1117   K 4.4 07/02/2021 1117   CL 101 07/02/2021 1117   CO2 25 07/02/2021 1117   GLUCOSE 78 07/02/2021 1117   GLUCOSE 66 10/12/2009 0000   BUN 25 (H) 07/02/2021 1117   CREATININE 1.02 07/02/2021 1117   CREATININE 0.94 02/07/2017 1542   CALCIUM 9.4 07/02/2021 1117   PROT 7.0 07/02/2021 1117   ALBUMIN 4.6 07/02/2021 1117   AST 19 07/02/2021 1117   ALT 21 07/02/2021 1117   ALKPHOS 83 07/02/2021 1117   BILITOT 0.4 07/02/2021 1117   GFRNONAA 76.20 03/30/2010 0000    Lab Results  Component Value Date   WBC 10.1 07/02/2021   NEUTROABS 5.7 07/02/2021   HGB 14.7 07/02/2021   HCT 45.0 07/02/2021   MCV 88.1 07/02/2021   PLT 191.0 07/02/2021    No results found for: LABCA2  No components found for: GGYIRS854  No results for input(s): INR in the last 168 hours.  No results found for: LABCA2  No results found for: OEV035  No results found for: KKX381  No results found for: WEX937  No results found for: CA2729  No components found for: HGQUANT  No results found for: CEA1 / No results found for: CEA1   No results found for: AFPTUMOR  No results found for: CHROMOGRNA  No results found for: TOTALPROTELP, ALBUMINELP, A1GS, A2GS, BETS, BETA2SER, GAMS, MSPIKE, SPEI (this displays SPEP labs)  No results found for: KPAFRELGTCHN, LAMBDASER, KAPLAMBRATIO (kappa/lambda light chains)  No results found for: HGBA, HGBA2QUANT, HGBFQUANT, HGBSQUAN (Hemoglobinopathy evaluation)   No results found for: LDH  No results found for: IRON, TIBC,  IRONPCTSAT (Iron and TIBC)  No results found for: FERRITIN  Urinalysis No results found for: COLORURINE, APPEARANCEUR, LABSPEC, PHURINE, GLUCOSEU, HGBUR, BILIRUBINUR, KETONESUR, PROTEINUR, UROBILINOGEN, NITRITE, LEUKOCYTESUR   STUDIES: DG Bone Density  Result Date: 07/16/2021 EXAM: DUAL X-RAY ABSORPTIOMETRY (DXA) FOR BONE MINERAL DENSITY IMPRESSION: Referring Physician:  Chauncey Cruel Your  patient completed a bone mineral density test using GE Lunar iDXA system (analysis version: 16). Technologist: Crockett PATIENT: Name: Alexis Wood, Alexis Wood Patient ID: [id number] Birth Date: 07-Aug-1956 Height: 63.0 in. Sex: Female Measured: 07/16/2021 Weight: 161.2 lbs. Indications: Anastrazole, Breast Cancer History, Caucasian, Estrogen Deficient, History of Fracture (Adult) (V15.51), Levothyroxine, osteoarthiritis, Postmenopausal, Thyroidectomy, Wellbutrin, Zoloft Fractures: Finger Treatments: Hormone Therapy For Cancer, Vitamin D (E933.5) ASSESSMENT: The BMD measured at AP Spine L1-L4 is 1.051 g/cm2 with a T-score of -1.1. This patient is considered osteopenic/low bone mass according to [facility]) criteria. The quality of the exam is good. Site Region Measured Date Measured Age YA BMD Significant CHANGE T-score AP Spine  L1-L4      07/16/2021    65.1         -1.1    1.051 g/cm2 AP Spine  L1-L4      07/16/2019    63.1         -0.9    1.077 g/cm2 DualFemur Neck Right 07/16/2021    65.1         -0.8    0.924 g/cm2 DualFemur Neck Right 07/16/2019    63.1         -0.6    0.955 g/cm2 DualFemur Total Mean 07/16/2021 65.1 -0.5 0.947 g/cm2 * DualFemur Total Mean 07/16/2019    63.1         -0.2    0.981 g/cm2 World Health Organization [facility]) criteria for post-menopausal, Caucasian Women: Normal       T-score at or above -1 SD Osteopenia   T-score between -1 and -2.5 SD Osteoporosis T-score at or below -2.5 SD RECOMMENDATION: 1. All patients should optimize calcium and vitamin D intake. 2. Consider FDA-approved  medical therapies in postmenopausal women and men aged 81 years and older, based on the following: a. A hip or vertebral (clinical or morphometric) fracture. b. T-score = -2.5 at the femoral neck or spine after appropriate evaluation to exclude secondary causes. c. Low bone mass (T-score between -1.0 and -2.5 at the femoral neck or spine) and a 10-year probability of a hip fracture = 3% or a 10-year probability of a major osteoporosis-related fracture = 20% based on the [place]-adapted WHO algorithm. d. Clinician judgment and/or patient preferences may indicate treatment for people with 10-year fracture probabilities above or below these levels. FOLLOW-UP: Patients with diagnosis of osteoporosis or at high risk for fracture should have regular bone mineral density tests.? Patients eligible for Medicare are allowed routine testing every 2 years.? The testing frequency can be increased to one year for patients who have rapidly progressing disease, are receiving or discontinuing medical therapy to restore bone mass, or have additional risk factors. I have reviewed this study and agree with the findings. [facility] FRAX* 10-year Probability of Fracture Based on femoral neck BMD: DualFemur (Right) Major Osteoporotic Fracture: 12.6% Hip Fracture:                0.8% Population:                  [place] (Caucasian) Risk Factors:                History of Fracture (Adult) (V15.51) *FRAX is a Materials engineer of the [employer] of [employer] for Metabolic Bone Disease, a [place] (WHO) [facility]. ASSESSMENT: The probability of a major osteoporotic fracture is 12.6% within the next ten years. The probability of a hip fracture is 0.8% within  the next ten years. Electronically Signed   By: Elmer Picker M.D.   On: 07/16/2021 11:55   MM 3D SCREEN BREAST BILATERAL  Result Date: 07/23/2021 CLINICAL DATA:  Screening. EXAM: DIGITAL SCREENING BILATERAL MAMMOGRAM WITH  TOMOSYNTHESIS AND CAD TECHNIQUE: Bilateral screening digital craniocaudal and mediolateral oblique mammograms were obtained. Bilateral screening digital breast tomosynthesis was performed. The images were evaluated with computer-aided detection. COMPARISON:  Previous exam(s). ACR Breast Density Category b: There are scattered areas of fibroglandular density. FINDINGS: There are no findings suspicious for malignancy. IMPRESSION: No mammographic evidence of malignancy. A result letter of this screening mammogram will be mailed directly to the patient. RECOMMENDATION: Screening mammogram in one year. (Code:SM-B-01Y) BI-RADS CATEGORY  1: Negative. Electronically Signed   By: Kristopher Oppenheim M.D.   On: 07/23/2021 10:26     ELIGIBLE FOR AVAILABLE RESEARCH PROTOCOL: no  ASSESSMENT: 65 y.o. [place] woman status post left lumpectomy 09/05/2015 for atypical ductal hyperplasia  1. Anastrozole started in 07/2017  (a) bone density at lumbar 06/10/2016 reported as "normal".  (b) bone density 07-16-2019 shows a T score of -0.9 (normal).  (c) bone density 07/16/2021 shows a T score of -1.1.  2. Biopsy on 01/09/2018 consistent with a complex sclerosing Wood in the right upper outer quadrant  (a) right lumpectomy 02/05/2018 shows only the complex sclerosing Wood, no evidence of malignancy  3.  Intensified screening: Given low breast density no need to continue yearly breast MRI     PLAN: Alexis Wood is now 4 years out from her left lumpectomy which showed atypical ductal hyperplasia.  This put her in the "high risk" category and we initially followed her with breast MRIs and mammography but given her breast density, which is B, I do not think breast MRI yearly we will add much in terms of screening.  She has completed 4 years of anastrozole and will complete 1 more year, stopping in October 2023.  By taking this pill for 5 years she will have cut her risk of breast cancer in half.  There is no need  to continue anastrozole beyond 07/16/2021.  All she needs in terms of screening for breast cancer then is her yearly mammogram and a yearly physician breast exam.  She tells me she would prefer to have those done through her primary care physician's office, Dr. Birdie Riddle, and I am very comfortable with that  Accordingly we are not making a return appointment for Alexis Wood here, although of course we will be glad to see her at any point in the future if and when the need arises.  I have encouraged her to exercise a minimum of 45 minutes a minimum of 5 days a week.  [facility]  [place] also [place] good neighborhood to walk around  Total encounter time 25 minutes.    Alexis Wood, Virgie Dad, MD  08/06/21 11:38 AM Medical Oncology and Hematology [facility] [place], [place] [address] Tel. [phone]    Fax. [phone]    I, Wilburn Mylar, am acting as scribe for Dr. Virgie Dad. Lindsey Demonte.  I, Lurline Del MD, have reviewed the above documentation for accuracy and completeness, and I agree with the above.   *Total Encounter Time as defined by the Centers for Medicare and Medicaid Services includes, in addition to the face-to-face time of a patient visit (documented in the note above) non-face-to-face time: obtaining and reviewing outside history, ordering and reviewing medications, tests  or procedures, care coordination (communications with other health care professionals or caregivers) and documentation in the medical record.

## 2021-08-06 ENCOUNTER — Inpatient Hospital Stay: Payer: Medicare Other | Attending: Oncology | Admitting: Oncology

## 2021-08-06 ENCOUNTER — Other Ambulatory Visit: Payer: Self-pay

## 2021-08-06 VITALS — BP 144/77 | HR 88 | Temp 98.6°F | Resp 18 | Ht 64.0 in | Wt 162.8 lb

## 2021-08-06 DIAGNOSIS — N6092 Unspecified benign mammary dysplasia of left breast: Secondary | ICD-10-CM

## 2021-08-06 DIAGNOSIS — N6489 Other specified disorders of breast: Secondary | ICD-10-CM | POA: Insufficient documentation

## 2021-08-06 MED ORDER — ANASTROZOLE 1 MG PO TABS: 1.0000 mg | ORAL_TABLET | Freq: Every day | ORAL | 4 refills | Status: AC

## 2021-08-25 ENCOUNTER — Other Ambulatory Visit: Payer: Self-pay | Admitting: Family Medicine

## 2021-09-11 ENCOUNTER — Telehealth: Payer: Self-pay | Admitting: Family Medicine

## 2021-09-11 NOTE — Telephone Encounter (Signed)
Pt's daughter gave her gummies for christmas that are to help with relaxing and unwinding. They are Ashwagandha. She wanted to know if Dr. Beverely Low thinks they are safe to take with her other meds.   Please advise   Home #

## 2021-09-11 NOTE — Telephone Encounter (Signed)
Ok to take as directed on the bottle

## 2021-09-11 NOTE — Telephone Encounter (Signed)
Patient is aware 

## 2021-09-24 ENCOUNTER — Other Ambulatory Visit: Payer: Self-pay | Admitting: Family Medicine

## 2021-10-22 ENCOUNTER — Encounter (INDEPENDENT_AMBULATORY_CARE_PROVIDER_SITE_OTHER): Payer: Medicare Other | Admitting: Ophthalmology

## 2021-10-22 ENCOUNTER — Other Ambulatory Visit: Payer: Self-pay

## 2021-10-22 ENCOUNTER — Encounter (INDEPENDENT_AMBULATORY_CARE_PROVIDER_SITE_OTHER): Payer: Self-pay | Admitting: Ophthalmology

## 2021-10-22 ENCOUNTER — Ambulatory Visit (INDEPENDENT_AMBULATORY_CARE_PROVIDER_SITE_OTHER): Payer: Medicare Other | Admitting: Ophthalmology

## 2021-10-22 DIAGNOSIS — H353123 Nonexudative age-related macular degeneration, left eye, advanced atrophic without subfoveal involvement: Secondary | ICD-10-CM | POA: Diagnosis not present

## 2021-10-22 DIAGNOSIS — H353222 Exudative age-related macular degeneration, left eye, with inactive choroidal neovascularization: Secondary | ICD-10-CM | POA: Diagnosis not present

## 2021-10-22 DIAGNOSIS — H353113 Nonexudative age-related macular degeneration, right eye, advanced atrophic without subfoveal involvement: Secondary | ICD-10-CM | POA: Diagnosis not present

## 2021-10-22 NOTE — Assessment & Plan Note (Signed)

## 2021-10-22 NOTE — Assessment & Plan Note (Signed)
Still no sign of recurrence OS

## 2021-10-22 NOTE — Progress Notes (Signed)
10/22/2021     CHIEF COMPLAINT Patient presents for  Chief Complaint  Patient presents with   Retina Follow Up      HISTORY OF PRESENT ILLNESS: Alexis Wood is a 66 y.o. female who presents to the clinic today for:   HPI     Retina Follow Up           Diagnosis: Wet AMD   Laterality: left eye   Onset: 6 months ago   Severity: mild   Duration: 6 months         Comments   6 mos fu ou oct fp. Pt states "wiggly vision is very little, not an issue to me. No flashes of light and no new floaters."       Last edited by Nelva Nay on 10/22/2021 10:34 AM.      Referring physician: Sheliah Hatch, MD [phone] [place],  [place] [address]  HISTORICAL INFORMATION:   Selected notes from the MEDICAL RECORD NUMBER    Lab Results  Component Value Date   HGBA1C 5.9 11/10/2020     CURRENT MEDICATIONS: No current outpatient medications on file. (Ophthalmic Drugs)   No current facility-administered medications for this visit. (Ophthalmic Drugs)   Current Outpatient Medications (Other)  Medication Sig   tiZANidine (ZANAFLEX) 4 MG tablet TAKE 1 TABLET (4 MG TOTAL) BY MOUTH EVERY 8 (EIGHT) HOURS AS NEEDED FOR MUSCLE SPASMS (Patient not taking: Reported on 07/02/2021)   ALPRAZolam (XANAX) 1 MG tablet Take 1 tablet (1 mg total) by mouth 2 (two) times daily as needed. for anxiety (Patient not taking: Reported on 07/02/2021)   anastrozole (ARIMIDEX) 1 MG tablet Take 1 tablet (1 mg total) by mouth daily. Stop July 16, 2022   buPROPion (WELLBUTRIN XL) 300 MG 24 hr tablet TAKE 1 TABLET BY MOUTH EVERY DAY   levothyroxine (SYNTHROID) 125 MCG tablet Take 1 tablet (125 mcg total) by mouth daily.   losartan (COZAAR) 100 MG tablet TAKE 1 TABLET BY MOUTH EVERY DAY   Melatonin 10 MG CAPS Take by mouth.   sertraline (ZOLOFT) 100 MG tablet TAKE 1 TABLET BY MOUTH EVERY DAY   varenicline (CHANTIX) 1 MG tablet Take 1 tablet (1 mg total) by mouth 2 (two) times daily.  (Patient not taking: Reported on 07/02/2021)   Vitamin D, Ergocalciferol, 2000 units CAPS Take by mouth.   No current facility-administered medications for this visit. (Other)      REVIEW OF SYSTEMS: ROS   Negative for: Constitutional, Gastrointestinal, Neurological, Skin, Genitourinary, Musculoskeletal, HENT, Endocrine, Cardiovascular, Eyes, Respiratory, Psychiatric, Allergic/Imm, Heme/Lymph Last edited by Edmon Crape, MD on 10/22/2021 10:46 AM.       ALLERGIES Allergies  Allergen Reactions   Penicillins Hives   Pristiq [Desvenlafaxine] Hives    PAST MEDICAL HISTORY Past Medical History:  Diagnosis Date   Anxiety    Arthritis    hands   Dental crowns present    Hypertension    states under control with med., has been on med. "several years"   Hypothyroidism    Intermediate stage nonexudative age-related macular degeneration of right eye 03/06/2020   The nature of age--related macular degeneration was discussed with the patient as well as the distinction between dry and wet types. Checking an Amsler Grid daily with advice to return immediately should a distortion develop, was given to the patient. The patient 's smoking status now and in the past was determined and advice based on the  AREDS study was provided regarding the consumption of antio   Macular degeneration    Mass of right breast 01/2018   Past Surgical History:  Procedure Laterality Date   BREAST LUMPECTOMY WITH NEEDLE LOCALIZATION Left 09/05/2015   Procedure: LEFT BREAST WIRE LOCALIZED  LUMPECTOMY TIMES 2;  Surgeon: Harriette Bouillonhomas Cornett, MD;  Location: [facility];  Service: General;  Laterality: Left;   BREAST LUMPECTOMY WITH RADIOACTIVE SEED LOCALIZATION Right 02/05/2018   Procedure: RIGHT BREAST LUMPECTOMY WITH RADIOACTIVE SEED LOCALIZATION;  Surgeon: Harriette Bouillonornett, Thomas, MD;  Location: [facility];  Service: General;  Laterality: Right;   cataract Bilateral    DILATATION &  CURRETTAGE/HYSTEROSCOPY WITH RESECTOCOPE  02/21/2006   THYROIDECTOMY  Age 13    FAMILY HISTORY Family History  Problem Relation Age of Onset   Coronary artery disease Mother    Hypertension Mother    Dementia Mother    Coronary artery disease Father    Hypertension Father    Diabetes Father    Breast cancer Maternal Aunt    Diabetes type I Other     SOCIAL HISTORY Social History   Tobacco Use   Smoking status: Former    Packs/day: 0.00    Types: Cigarettes    Quit date: 01/14/2020    Years since quitting: 1.7   Smokeless tobacco: Never  Vaping Use   Vaping Use: Never used  Substance Use Topics   Alcohol use: No   Drug use: No         OPHTHALMIC EXAM:  Base Eye Exam     Visual Acuity (ETDRS)       Right Left   Dist [place] 20/20 20/30 -2         Tonometry (Tonopen, 10:38 AM)       Right Left   Pressure 12 14         Pupils       Pupils APD   Right PERRL None   Left PERRL None         Visual Fields (Counting fingers)       Left Right    Full Full         Extraocular Movement       Right Left    Full Full         Neuro/Psych     Oriented x3: Yes   Mood/Affect: Normal         Dilation     Both eyes: 1.0% Mydriacyl, 2.5% Phenylephrine @ 10:38 AM           Slit Lamp and Fundus Exam     External Exam       Right Left   External Normal Normal         Slit Lamp Exam       Right Left   Lids/Lashes Normal Normal   Conjunctiva/Sclera White and quiet White and quiet   Cornea Clear Clear   Anterior Chamber Deep and quiet Deep and quiet   Iris Round and reactive Round and reactive   Lens Posterior chamber intraocular lens Posterior chamber intraocular lens   Anterior Vitreous Normal Normal         Fundus Exam       Right Left   Posterior Vitreous Posterior vitreous detachment Posterior vitreous detachment   Disc Peripapillary atrophy Peripapillary atrophy   C/D Ratio 0.15 0.15   Macula Retinal pigment  epithelial mottling, Geographic atrophy encroaching near the [facility], Early age related macular degeneration Retinal  pigment epithelial mottling, Geographic atrophy range the foveal avascular zone, small island of tissue remaining, Early age related macular degeneration   Vessels Normal Normal   Periphery Normal Normal            IMAGING AND PROCEDURES  Imaging and Procedures for 10/22/21  OCT, Retina - OU - Both Eyes       Right Eye Quality was borderline. Scan locations included subfoveal. Central Foveal Thickness: 297. Progression has been stable. Findings include abnormal foveal contour, no SRF, no IRF, pigment epithelial detachment, central retinal atrophy, outer retinal atrophy, epiretinal membrane.   Left Eye Quality was good. Scan locations included subfoveal. Central Foveal Thickness: 273. Progression has improved. Findings include abnormal foveal contour, no IRF, no SRF, central retinal atrophy, pigment epithelial detachment, subretinal hyper-reflective material.   Notes No signs of active CNVM will observe OU outer retinal atrophy present.  Minor epiretinal membrane temporal aspect of FAZ and tiny anterior foveal macular schisis not extending to the photoreceptor layer thus not visually significant OD       Color Fundus Photography Optos - OU - Both Eyes       Right Eye Progression has been stable. Disc findings include normal observations. Macula : geographic atrophy, drusen. Vessels : normal observations. Periphery : normal observations.   Left Eye Progression has been stable. Disc findings include normal observations. Macula : geographic atrophy, drusen. Vessels : normal observations. Periphery : normal observations.   Notes OU with geographic atrophy as well as subretinal drusen.  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 The results of the AREDS 2 study was discussed with the patient. A diet rich in dark leafy green vegetables was advised and specific recommendations were made regarding supplements with AREDS 2 formulation . Control of hypertension and serum cholesterol may slow the disease. Smoking cessation is mandatory to slow the disease and diminish the risk of progressing to wet age related macular degeneration. The patient was instructed in the use of an Amsler Grid and was told to return immediately for any changes in the Grid. 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 The results of the AREDS 2 study was discussed with the patient. A diet rich in dark leafy green vegetables was advised and specific recommendations were made regarding supplements with AREDS 2 formulation . Control of hypertension and serum cholesterol may slow the disease. Smoking cessation is mandatory to slow the disease and diminish the risk of progressing to wet age related macular degeneration. The patient was instructed in the use of an Amsler Grid and was told to return immediately for any changes in the Grid. Stressed to the patient do not rub eyes       ICD-10-CM   1. 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  Advanced nonexudative age-related macular degeneration of left eye without subfoveal involvement  H35.3123       1.  Patient to contact the office promptly for new onset visual acuity distortion should it develop in either eye  2.  3.  Ophthalmic Meds Ordered this visit:  No orders of the defined types were placed in this encounter.      Return in about 6 months (around 04/21/2022) for DILATE OU, COLOR FP, OCT.  There are no Patient Instructions on file for this visit.   Explained the diagnoses, plan, and follow up with the patient and they expressed understanding.  Patient expressed understanding of the importance of proper follow up care.   Alford Highland Analiza Cowger M.D. Diseases & Surgery of the Retina and Vitreous [facility] 10/22/21     Abbreviations: M myopia (nearsighted); A astigmatism; H hyperopia (farsighted); P presbyopia; Mrx spectacle prescription;  CTL contact lenses; OD right eye; OS left eye; OU both eyes  XT exotropia; ET esotropia; PEK punctate epithelial keratitis; PEE punctate epithelial erosions; DES dry eye syndrome; MGD meibomian gland dysfunction; ATs artificial tears; PFAT's preservative free artificial tears; NSC nuclear sclerotic cataract; PSC posterior subcapsular cataract; ERM epi-retinal membrane; PVD posterior vitreous detachment; RD retinal detachment; DM diabetes mellitus; DR diabetic retinopathy; NPDR non-proliferative diabetic retinopathy; PDR proliferative diabetic retinopathy; CSME clinically significant macular edema; DME diabetic macular edema; dbh dot blot hemorrhages; CWS cotton wool spot; POAG primary open angle glaucoma; C/D cup-to-disc ratio; HVF humphrey visual field; GVF goldmann visual field; OCT optical coherence tomography; IOP intraocular pressure; BRVO Branch retinal vein occlusion; CRVO central retinal vein occlusion; CRAO central retinal artery occlusion; BRAO branch retinal artery occlusion; RT retinal tear; SB scleral buckle;  PPV pars plana vitrectomy; VH Vitreous hemorrhage; PRP panretinal laser photocoagulation; IVK intravitreal kenalog; VMT vitreomacular traction; MH Macular hole;  NVD neovascularization of the disc; NVE neovascularization elsewhere; AREDS age related eye disease study; ARMD age related macular degeneration; POAG primary open angle glaucoma; EBMD epithelial/anterior basement membrane dystrophy; ACIOL anterior chamber intraocular lens; IOL intraocular lens; PCIOL posterior chamber intraocular lens; Phaco/IOL phacoemulsification with intraocular lens placement; PRK photorefractive keratectomy; LASIK laser assisted in situ keratomileusis; HTN hypertension; DM diabetes mellitus; COPD chronic obstructive pulmonary disease

## 2021-10-24 ENCOUNTER — Other Ambulatory Visit: Payer: Self-pay | Admitting: Family Medicine

## 2021-10-26 ENCOUNTER — Other Ambulatory Visit: Payer: Self-pay | Admitting: Family Medicine

## 2021-10-26 DIAGNOSIS — I10 Essential (primary) hypertension: Secondary | ICD-10-CM

## 2021-11-21 ENCOUNTER — Other Ambulatory Visit: Payer: Self-pay | Admitting: Family Medicine

## 2021-12-16 ENCOUNTER — Other Ambulatory Visit: Payer: Self-pay | Admitting: Family Medicine

## 2022-01-01 ENCOUNTER — Ambulatory Visit (INDEPENDENT_AMBULATORY_CARE_PROVIDER_SITE_OTHER): Payer: Medicare Other | Admitting: Family Medicine

## 2022-01-01 ENCOUNTER — Encounter: Payer: Self-pay | Admitting: Family Medicine

## 2022-01-01 VITALS — BP 130/72 | HR 99 | Temp 97.6°F | Resp 16 | Ht 63.0 in | Wt 168.8 lb

## 2022-01-01 DIAGNOSIS — E039 Hypothyroidism, unspecified: Secondary | ICD-10-CM | POA: Diagnosis not present

## 2022-01-01 DIAGNOSIS — I10 Essential (primary) hypertension: Secondary | ICD-10-CM

## 2022-01-01 DIAGNOSIS — E559 Vitamin D deficiency, unspecified: Secondary | ICD-10-CM | POA: Diagnosis not present

## 2022-01-01 DIAGNOSIS — E785 Hyperlipidemia, unspecified: Secondary | ICD-10-CM

## 2022-01-01 DIAGNOSIS — F17200 Nicotine dependence, unspecified, uncomplicated: Secondary | ICD-10-CM

## 2022-01-01 DIAGNOSIS — Z0000 Encounter for general adult medical examination without abnormal findings: Secondary | ICD-10-CM

## 2022-01-01 LAB — VITAMIN D 25 HYDROXY (VIT D DEFICIENCY, FRACTURES): VITD: 39.32 ng/mL (ref 30.00–100.00)

## 2022-01-01 LAB — CBC WITH DIFFERENTIAL/PLATELET
Basophils Absolute: 0.1 10*3/uL (ref 0.0–0.1)
Basophils Relative: 1.1 % (ref 0.0–3.0)
Eosinophils Absolute: 0.5 10*3/uL (ref 0.0–0.7)
Eosinophils Relative: 5.3 % — ABNORMAL HIGH (ref 0.0–5.0)
HCT: 43.4 % (ref 36.0–46.0)
Hemoglobin: 14.7 g/dL (ref 12.0–15.0)
Lymphocytes Relative: 35.5 % (ref 12.0–46.0)
Lymphs Abs: 3.7 10*3/uL (ref 0.7–4.0)
MCHC: 33.9 g/dL (ref 30.0–36.0)
MCV: 88.1 fl (ref 78.0–100.0)
Monocytes Absolute: 0.6 10*3/uL (ref 0.1–1.0)
Monocytes Relative: 5.7 % (ref 3.0–12.0)
Neutro Abs: 5.4 10*3/uL (ref 1.4–7.7)
Neutrophils Relative %: 52.4 % (ref 43.0–77.0)
Platelets: 191 10*3/uL (ref 150.0–400.0)
RBC: 4.93 Mil/uL (ref 3.87–5.11)
RDW: 12.9 % (ref 11.5–15.5)
WBC: 10.3 10*3/uL (ref 4.0–10.5)

## 2022-01-01 LAB — COMPREHENSIVE METABOLIC PANEL
ALT: 22 U/L (ref 0–35)
AST: 18 U/L (ref 0–37)
Albumin: 4.6 g/dL (ref 3.5–5.2)
Alkaline Phosphatase: 90 U/L (ref 39–117)
BUN: 21 mg/dL (ref 6–23)
CO2: 29 mEq/L (ref 19–32)
Calcium: 9.7 mg/dL (ref 8.4–10.5)
Chloride: 101 mEq/L (ref 96–112)
Creatinine, Ser: 0.85 mg/dL (ref 0.40–1.20)
GFR: 71.82 mL/min (ref >60.00)
Glucose, Bld: 92 mg/dL (ref 70–99)
Potassium: 4.6 mEq/L (ref 3.5–5.1)
Sodium: 138 mEq/L (ref 135–145)
Total Bilirubin: 0.5 mg/dL (ref 0.2–1.2)
Total Protein: 7.1 g/dL (ref 6.0–8.3)

## 2022-01-01 LAB — LIPID PANEL
Cholesterol: 240 mg/dL — ABNORMAL HIGH (ref 0–200)
HDL: 76 mg/dL (ref >39.00)
LDL Cholesterol: 129 mg/dL — ABNORMAL HIGH (ref 0–99)
NonHDL: 164.38
Total CHOL/HDL Ratio: 3
Triglycerides: 179 mg/dL — ABNORMAL HIGH (ref 0.0–149.0)
VLDL: 35.8 mg/dL (ref 0.0–40.0)

## 2022-01-01 LAB — TSH: TSH: 1.58 u[IU]/mL (ref 0.35–5.50)

## 2022-01-01 NOTE — Assessment & Plan Note (Signed)
Despite having quit, pt has resumed smoking.  Now smoking 5 cigarettes daily.  Encouraged her to focus on quitting to improve her overall health. ?

## 2022-01-01 NOTE — Assessment & Plan Note (Signed)
Chronic problem.  Pt has been able to control recently w/ lifestyle and has not been on medication.  Check labs and determine if medication is needed. ?
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
## 2022-01-01 NOTE — Patient Instructions (Signed)
Schedule an appt for skin tag removal at your convenience ?We'll notify you of your lab results and make any changes if needed ?Continue to work on healthy diet and regular exercise- you can do it!!! ?STOP SMOKING!!!  You've quit before, you can do it again! ?Consider talking to someone about the stress and anxiety you feel.  This can help you find your peace again ?Call with any questions or concerns ?Stay Safe!  Stay Healthy! ?Happy Spring!!! ?

## 2022-01-01 NOTE — Assessment & Plan Note (Signed)
Chronic problem.  Currently well controlled on Losartan 100mg  daily.  Will check labs due to ARB but no anticipated med changes. ?

## 2022-01-01 NOTE — Assessment & Plan Note (Signed)
Check labs and replete prn. 

## 2022-01-02 ENCOUNTER — Other Ambulatory Visit: Payer: Self-pay | Admitting: Family Medicine

## 2022-01-02 MED ORDER — ALPRAZOLAM 1 MG PO TABS: 1.0000 mg | ORAL_TABLET | Freq: Two times a day (BID) | ORAL | 3 refills | Status: DC | PRN

## 2022-01-08 ENCOUNTER — Telehealth: Payer: Self-pay

## 2022-01-08 ENCOUNTER — Other Ambulatory Visit: Payer: Self-pay | Admitting: Family Medicine

## 2022-01-08 NOTE — Telephone Encounter (Signed)
Patient aware of lab results. Mailed lab results per patient request ?

## 2022-01-15 ENCOUNTER — Other Ambulatory Visit: Payer: Self-pay | Admitting: Family Medicine

## 2022-04-02 ENCOUNTER — Other Ambulatory Visit: Payer: Self-pay | Admitting: Family Medicine

## 2022-04-22 ENCOUNTER — Ambulatory Visit (INDEPENDENT_AMBULATORY_CARE_PROVIDER_SITE_OTHER): Payer: Medicare Other | Admitting: Ophthalmology

## 2022-04-22 ENCOUNTER — Encounter (INDEPENDENT_AMBULATORY_CARE_PROVIDER_SITE_OTHER): Payer: Self-pay | Admitting: Ophthalmology

## 2022-04-22 ENCOUNTER — Encounter (INDEPENDENT_AMBULATORY_CARE_PROVIDER_SITE_OTHER): Payer: Medicare Other | Admitting: Ophthalmology

## 2022-04-22 DIAGNOSIS — H353123 Nonexudative age-related macular degeneration, left eye, advanced atrophic without subfoveal involvement: Secondary | ICD-10-CM | POA: Diagnosis not present

## 2022-04-22 DIAGNOSIS — H353222 Exudative age-related macular degeneration, left eye, with inactive choroidal neovascularization: Secondary | ICD-10-CM | POA: Diagnosis not present

## 2022-04-22 DIAGNOSIS — H353113 Nonexudative age-related macular degeneration, right eye, advanced atrophic without subfoveal involvement: Secondary | ICD-10-CM

## 2022-04-22 DIAGNOSIS — Z961 Presence of intraocular lens: Secondary | ICD-10-CM | POA: Insufficient documentation

## 2022-04-22 NOTE — Assessment & Plan Note (Addendum)
Stable OU 

## 2022-04-22 NOTE — Assessment & Plan Note (Signed)
OS with parafoveal geographic atrophy, candidate for Syfovre.  Patient not interested at this time

## 2022-04-22 NOTE — Progress Notes (Signed)
04/22/2022     CHIEF COMPLAINT Patient presents for  Chief Complaint  Patient presents with   Macular Degeneration      HISTORY OF PRESENT ILLNESS: Alexis Wood is a 66 y.o. female who presents to the clinic today for:   HPI   6 MOS for DILATE OU, COLOR FP, OCT. Pt stated vision has been stable since last visit. Pt denies floaters and FOL.  Last edited by [name] on 04/22/2022  9:45 AM.      Referring physician: [name], MD [address] [place],  [place] [address]  HISTORICAL INFORMATION:   Selected notes from the MEDICAL RECORD NUMBER    Lab Results  Component Value Date   HGBA1C 5.9 11/10/2020     CURRENT MEDICATIONS: No current outpatient medications on file. (Ophthalmic Drugs)   No current facility-administered medications for this visit. (Ophthalmic Drugs)   Current Outpatient Medications (Other)  Medication Sig   ALPRAZolam (XANAX) 1 MG tablet Take 1 tablet (1 mg total) by mouth 2 (two) times daily as needed. for anxiety   anastrozole (ARIMIDEX) 1 MG tablet Take 1 tablet (1 mg total) by mouth daily. Stop July 16, 2022   buPROPion (WELLBUTRIN XL) 300 MG 24 hr tablet TAKE 1 TABLET BY MOUTH EVERY DAY   levothyroxine (SYNTHROID) 125 MCG tablet Take 1 tablet (125 mcg total) by mouth daily.   losartan (COZAAR) 100 MG tablet TAKE 1 TABLET BY MOUTH EVERY DAY   Melatonin 10 MG CAPS Take by mouth.   Multiple Vitamins-Minerals (PRESERVISION AREDS 2+MULTI VIT PO) Take by mouth.   sertraline (ZOLOFT) 100 MG tablet TAKE 1 TABLET BY MOUTH EVERY DAY   Vitamin D, Ergocalciferol, 2000 units CAPS Take by mouth.   No current facility-administered medications for this visit. (Other)      REVIEW OF SYSTEMS: ROS   Negative for: Constitutional, Gastrointestinal, Neurological, Skin, Genitourinary, Musculoskeletal, HENT, Endocrine, Cardiovascular, Eyes, Respiratory, Psychiatric, Allergic/Imm, Heme/Lymph Last edited by [name] on 04/22/2022  9:45 AM.        ALLERGIES Allergies  Allergen Reactions   Penicillins Hives   Pristiq [Desvenlafaxine] Hives    PAST MEDICAL HISTORY Past Medical History:  Diagnosis Date   Anxiety    Arthritis    hands   Dental crowns present    Hypertension    states under control with med., has been on med. "several years"   Hypothyroidism    Intermediate stage nonexudative age-related macular degeneration of right eye 03/06/2020   The nature of age--related macular degeneration was discussed with the patient as well as the distinction between dry and wet types. Checking an Amsler Grid daily with advice to return immediately should a distortion develop, was given to the patient. The patient 's smoking status now and in the past was determined and advice based on the AREDS study was provided regarding the consumption of antio   Macular degeneration    Mass of right breast 01/2018   Past Surgical History:  Procedure Laterality Date   BREAST LUMPECTOMY WITH NEEDLE LOCALIZATION Left 09/05/2015   Procedure: LEFT BREAST WIRE LOCALIZED  LUMPECTOMY TIMES 2;  Surgeon: Harriette Bouillon, MD;  Location: [facility];  Service: General;  Laterality: Left;   BREAST LUMPECTOMY WITH RADIOACTIVE SEED LOCALIZATION Right 02/05/2018   Procedure: RIGHT BREAST LUMPECTOMY WITH RADIOACTIVE SEED LOCALIZATION;  Surgeon: Harriette Bouillon, MD;  Location: [facility];  Service: General;  Laterality: Right;   cataract Bilateral  DILATATION & CURRETTAGE/HYSTEROSCOPY WITH RESECTOCOPE  02/21/2006   THYROIDECTOMY  Age 72    FAMILY HISTORY Family History  Problem Relation Age of Onset   Coronary artery disease Mother    Hypertension Mother    Dementia Mother    Coronary artery disease Father    Hypertension Father    Diabetes Father    Breast cancer Maternal Aunt    Diabetes type I Other     SOCIAL HISTORY Social History   Tobacco Use   Smoking status: Every Day    Packs/day: 0.25    Types:  Cigarettes   Smokeless tobacco: Never  Vaping Use   Vaping Use: Never used  Substance Use Topics   Alcohol use: No   Drug use: No         OPHTHALMIC EXAM:  Base Eye Exam     Visual Acuity (ETDRS)       Right Left   Dist cc 20/20 20/30   Dist ph cc  20/25 -2    Correction: Glasses         Tonometry (Tonopen, 9:49 AM)       Right Left   Pressure 15 16         Pupils       Pupils APD   Right PERRL None   Left PERRL None         Visual Fields       Left Right    Full          Extraocular Movement       Right Left    Full, Ortho Full, Ortho         Neuro/Psych     Oriented x3: Yes   Mood/Affect: Normal         Dilation     Both eyes: 1.0% Mydriacyl, 2.5% Phenylephrine @ 9:49 AM           Slit Lamp and Fundus Exam     External Exam       Right Left   External Normal Normal         Slit Lamp Exam       Right Left   Lids/Lashes Normal Normal   Conjunctiva/Sclera White and quiet White and quiet   Cornea Clear Clear   Anterior Chamber Deep and quiet Deep and quiet   Iris Round and reactive Round and reactive   Lens Posterior chamber intraocular lens Posterior chamber intraocular lens   Anterior Vitreous Normal Normal         Fundus Exam       Right Left   Posterior Vitreous Posterior vitreous detachment Posterior vitreous detachment   Disc Peripapillary atrophy Peripapillary atrophy   C/D Ratio 0.15 0.15   Macula Retinal pigment epithelial mottling, Geographic atrophy encroaching near the [facility], Early age related macular degeneration Retinal pigment epithelial mottling, Geographic atrophy range the foveal avascular zone, small island of tissue remaining, Early age related macular degeneration   Vessels Normal Normal   Periphery Normal Normal            IMAGING AND PROCEDURES  Imaging and Procedures for 04/22/22  OCT, Retina - OU - Both Eyes       Right Eye Quality was borderline. Scan locations included  subfoveal. Central Foveal Thickness: 302. Progression has been stable. Findings include no IRF, no SRF, abnormal foveal contour, retinal drusen , epiretinal membrane, pigment epithelial detachment, central retinal atrophy, outer retinal atrophy.   Left Eye Quality was good.  Scan locations included subfoveal. Central Foveal Thickness: 293. Progression has been stable. Findings include no IRF, no SRF, abnormal foveal contour, retinal drusen , subretinal hyper-reflective material, pigment epithelial detachment, central retinal atrophy.   Notes No signs of active CNVM will observe OU outer retinal atrophy present.  Minor epiretinal membrane temporal aspect of FAZ and tiny anterior foveal macular schisis not extending to the photoreceptor layer thus not visually significant OD       Color Fundus Photography Optos - OU - Both Eyes       Right Eye Progression has been stable. Macula : drusen. Periphery : normal observations.   Left Eye Progression has been stable. Macula : drusen, geographic atrophy. Vessels : normal observations. Periphery : normal observations.   Notes Clear media OU.  OS with parafoveal geographic atrophy, candidate for Syfovre.  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  Patient not interested at this time  Advanced nonexudative age-related macular degeneration of right eye without subfoveal involvement Drusen, of intermediate AMD but no significant trophy, not geographic atrophy  Pseudophakia, both eyes Stable OU     ICD-10-CM   1. Exudative age-related macular degeneration of left eye with inactive choroidal neovascularization (HCC)  H35.3222 OCT, Retina - OU - Both Eyes    Color Fundus Photography Optos - OU - Both Eyes    2. Advanced nonexudative age-related macular  degeneration of left eye without subfoveal involvement  H35.3123     3. Advanced nonexudative age-related macular degeneration of right eye without subfoveal involvement  H35.3113     4. Pseudophakia, both eyes  Z96.1       1.  OS, with geographic atrophy nearing the FAZ.  Good acuity maintain.  Patient knows to contact us promptly if new visual distortions or acuity changes develop  2.  OD intermediate AMD.  No sign of CNVM  3.  Ophthalmic Meds Ordered this visit:  No orders of the defined types were placed in this encounter.      Return in about 6 months (around 10/23/2022) for DILATE OU, COLOR FP, OCT.  There are no Patient Instructions on file for this visit.   Explained the diagnoses, plan, and follow up with the patient and they expressed understanding.  Patient expressed understanding of the importance of proper follow up care.   Alford Highland Jere Bostrom M.D. Diseases & Surgery of the Retina and Vitreous [facility] 04/22/22     Abbreviations: M myopia (nearsighted); A astigmatism; H hyperopia (farsighted); P presbyopia; Mrx spectacle prescription;  CTL contact lenses; OD right eye; OS left eye; OU both eyes  XT exotropia; ET esotropia; PEK punctate epithelial keratitis; PEE punctate epithelial erosions; DES dry eye syndrome; MGD meibomian gland dysfunction; ATs artificial tears; PFAT's preservative free artificial tears; NSC nuclear sclerotic cataract; PSC posterior subcapsular cataract; ERM epi-retinal membrane; PVD posterior vitreous detachment; RD retinal detachment; DM diabetes mellitus; DR diabetic retinopathy; NPDR non-proliferative diabetic retinopathy; PDR proliferative diabetic retinopathy; CSME clinically significant macular edema; DME diabetic macular edema; dbh dot blot hemorrhages; CWS cotton wool spot; POAG primary open angle glaucoma; C/D cup-to-disc ratio; HVF humphrey visual field; GVF goldmann visual field; OCT optical coherence tomography; IOP  intraocular pressure; BRVO Branch retinal vein occlusion; CRVO central retinal vein occlusion; CRAO central retinal artery occlusion; BRAO branch retinal artery  occlusion; RT retinal tear; SB scleral buckle; PPV pars plana vitrectomy; VH Vitreous hemorrhage; PRP panretinal laser photocoagulation; IVK intravitreal kenalog; VMT vitreomacular traction; MH Macular hole;  NVD neovascularization of the disc; NVE neovascularization elsewhere; AREDS age related eye disease study; ARMD age related macular degeneration; POAG primary open angle glaucoma; EBMD epithelial/anterior basement membrane dystrophy; ACIOL anterior chamber intraocular lens; IOL intraocular lens; PCIOL posterior chamber intraocular lens; Phaco/IOL phacoemulsification with intraocular lens placement; [place] photorefractive keratectomy; LASIK laser assisted in situ keratomileusis; HTN hypertension; DM diabetes mellitus; COPD chronic obstructive pulmonary disease

## 2022-04-22 NOTE — Assessment & Plan Note (Signed)
Drusen, of intermediate AMD but no significant trophy, not geographic atrophy

## 2022-06-12 ENCOUNTER — Other Ambulatory Visit: Payer: Self-pay | Admitting: Family Medicine

## 2022-06-12 DIAGNOSIS — I10 Essential (primary) hypertension: Secondary | ICD-10-CM

## 2022-06-13 ENCOUNTER — Other Ambulatory Visit: Payer: Self-pay | Admitting: Family Medicine

## 2022-06-13 DIAGNOSIS — Z1231 Encounter for screening mammogram for malignant neoplasm of breast: Secondary | ICD-10-CM

## 2022-07-03 ENCOUNTER — Ambulatory Visit (INDEPENDENT_AMBULATORY_CARE_PROVIDER_SITE_OTHER): Payer: Medicare Other | Admitting: Family Medicine

## 2022-07-03 ENCOUNTER — Encounter: Payer: Self-pay | Admitting: Family Medicine

## 2022-07-03 VITALS — BP 132/76 | HR 83 | Temp 98.1°F | Ht 63.0 in | Wt 158.0 lb

## 2022-07-03 DIAGNOSIS — Z23 Encounter for immunization: Secondary | ICD-10-CM | POA: Diagnosis not present

## 2022-07-03 DIAGNOSIS — F419 Anxiety disorder, unspecified: Secondary | ICD-10-CM

## 2022-07-03 DIAGNOSIS — N6092 Unspecified benign mammary dysplasia of left breast: Secondary | ICD-10-CM

## 2022-07-03 DIAGNOSIS — F32A Depression, unspecified: Secondary | ICD-10-CM

## 2022-07-03 MED ORDER — SERTRALINE HCL 100 MG PO TABS: 100.0000 mg | ORAL_TABLET | Freq: Every day | ORAL | 1 refills | Status: DC

## 2022-07-03 NOTE — Patient Instructions (Signed)
Schedule your complete physical in 3 months No need for labs today- yay!!! Continue the Sertraline, the Bupropion, and the Alprazolam daily You can take 2 Aleve nightly for the neck pain/arthritis Call with any questions or concerns Stay Safe!  Stay Healthy! Hang in there!!!

## 2022-07-03 NOTE — Progress Notes (Signed)
   Subjective:    Patient ID: Alexis Wood, female    DOB: 09/06/1956, 66 y.o.   MRN: [id number]  HPI Health maintenance- pt is not sure whether she should continue mammograms or not.  UTD until the end of month.  Dr Darnelle Catalan had been ordering them but he retired.  Anxiety/Depression- pt feels that things are going well currently.  On Wellbutrin 300mg  XL daily and Sertraline 100mg  daily and Alprazolam prn.    Review of Systems For ROS see HPI     Objective:   Physical Exam Vitals reviewed.  Constitutional:      General: She is not in acute distress.    Appearance: Normal appearance. She is not ill-appearing.  Skin:    General: Skin is warm and dry.  Neurological:     General: No focal deficit present.     Mental Status: She is alert and oriented to person, place, and time.  Psychiatric:        Mood and Affect: Mood normal.        Behavior: Behavior normal.        Thought Content: Thought content normal.           Assessment & Plan:

## 2022-07-17 ENCOUNTER — Ambulatory Visit
Admission: RE | Admit: 2022-07-17 | Discharge: 2022-07-17 | Disposition: A | Discharge status: Home / Self Care | Payer: Medicare Other | Source: Ambulatory Visit | Attending: Family Medicine | Admitting: Family Medicine

## 2022-07-17 DIAGNOSIS — Z1231 Encounter for screening mammogram for malignant neoplasm of breast: Secondary | ICD-10-CM

## 2022-07-19 ENCOUNTER — Other Ambulatory Visit: Payer: Self-pay | Admitting: Family Medicine

## 2022-09-04 ENCOUNTER — Other Ambulatory Visit: Payer: Self-pay | Admitting: Family Medicine

## 2022-09-05 NOTE — Telephone Encounter (Signed)
Notified pt that Rx has been sent in  

## 2022-09-05 NOTE — Telephone Encounter (Signed)
Xanax 1 mg LOV: 07/03/22 Last Refill:01/02/22 Upcoming appt: 11/12/22

## 2022-09-27 ENCOUNTER — Other Ambulatory Visit: Payer: Self-pay | Admitting: Family Medicine

## 2022-09-27 DIAGNOSIS — E039 Hypothyroidism, unspecified: Secondary | ICD-10-CM

## 2022-10-23 ENCOUNTER — Encounter (INDEPENDENT_AMBULATORY_CARE_PROVIDER_SITE_OTHER): Payer: Medicare Other | Admitting: Ophthalmology

## 2022-11-11 ENCOUNTER — Encounter: Payer: Medicare Other | Admitting: Family Medicine

## 2022-11-12 ENCOUNTER — Encounter: Payer: Self-pay | Admitting: Family Medicine

## 2022-11-12 ENCOUNTER — Ambulatory Visit (INDEPENDENT_AMBULATORY_CARE_PROVIDER_SITE_OTHER): Payer: Medicare Other | Admitting: Family Medicine

## 2022-11-12 VITALS — BP 120/80 | HR 93 | Temp 97.9°F | Resp 17 | Ht 63.0 in | Wt 164.0 lb

## 2022-11-12 DIAGNOSIS — Z0000 Encounter for general adult medical examination without abnormal findings: Secondary | ICD-10-CM | POA: Diagnosis not present

## 2022-11-12 DIAGNOSIS — I10 Essential (primary) hypertension: Secondary | ICD-10-CM | POA: Diagnosis not present

## 2022-11-12 DIAGNOSIS — E559 Vitamin D deficiency, unspecified: Secondary | ICD-10-CM

## 2022-11-12 LAB — BASIC METABOLIC PANEL
BUN: 20 mg/dL (ref 6–23)
CO2: 30 mEq/L (ref 19–32)
Calcium: 9.7 mg/dL (ref 8.4–10.5)
Chloride: 99 mEq/L (ref 96–112)
Creatinine, Ser: 0.9 mg/dL (ref 0.40–1.20)
GFR: 66.66 mL/min (ref >60.00)
Glucose, Bld: 82 mg/dL (ref 70–99)
Potassium: 4.8 mEq/L (ref 3.5–5.1)
Sodium: 139 mEq/L (ref 135–145)

## 2022-11-12 LAB — CBC WITH DIFFERENTIAL/PLATELET
Basophils Absolute: 0.1 10*3/uL (ref 0.0–0.1)
Basophils Relative: 0.6 % (ref 0.0–3.0)
Eosinophils Absolute: 0.4 10*3/uL (ref 0.0–0.7)
Eosinophils Relative: 4 % (ref 0.0–5.0)
HCT: 45.5 % (ref 36.0–46.0)
Hemoglobin: 15.3 g/dL — ABNORMAL HIGH (ref 12.0–15.0)
Lymphocytes Relative: 35.4 % (ref 12.0–46.0)
Lymphs Abs: 3.9 10*3/uL (ref 0.7–4.0)
MCHC: 33.6 g/dL (ref 30.0–36.0)
MCV: 87.4 fl (ref 78.0–100.0)
Monocytes Absolute: 0.7 10*3/uL (ref 0.1–1.0)
Monocytes Relative: 6 % (ref 3.0–12.0)
Neutro Abs: 6 10*3/uL (ref 1.4–7.7)
Neutrophils Relative %: 54 % (ref 43.0–77.0)
Platelets: 225 10*3/uL (ref 150.0–400.0)
RBC: 5.21 Mil/uL — ABNORMAL HIGH (ref 3.87–5.11)
RDW: 12.9 % (ref 11.5–15.5)
WBC: 11.1 10*3/uL — ABNORMAL HIGH (ref 4.0–10.5)

## 2022-11-12 LAB — LIPID PANEL
Cholesterol: 210 mg/dL — ABNORMAL HIGH (ref 0–200)
HDL: 75.2 mg/dL (ref >39.00)
LDL Cholesterol: 103 mg/dL — ABNORMAL HIGH (ref 0–99)
NonHDL: 135.08
Total CHOL/HDL Ratio: 3
Triglycerides: 162 mg/dL — ABNORMAL HIGH (ref 0.0–149.0)
VLDL: 32.4 mg/dL (ref 0.0–40.0)

## 2022-11-12 LAB — VITAMIN D 25 HYDROXY (VIT D DEFICIENCY, FRACTURES): VITD: 31.37 ng/mL (ref 30.00–100.00)

## 2022-11-12 LAB — TSH: TSH: 1.25 u[IU]/mL (ref 0.35–5.50)

## 2022-11-12 LAB — HEPATIC FUNCTION PANEL
ALT: 22 U/L (ref 0–35)
AST: 20 U/L (ref 0–37)
Albumin: 4.5 g/dL (ref 3.5–5.2)
Alkaline Phosphatase: 103 U/L (ref 39–117)
Bilirubin, Direct: 0.1 mg/dL (ref 0.0–0.3)
Total Bilirubin: 0.5 mg/dL (ref 0.2–1.2)
Total Protein: 7.1 g/dL (ref 6.0–8.3)

## 2022-11-12 NOTE — Assessment & Plan Note (Signed)
Check labs and replete prn. 

## 2022-11-12 NOTE — Patient Instructions (Addendum)
Follow up in 6 months to recheck BP and cholesterol We'll notify you of your lab results and make any changes if needed Keep up the good work on healthy diet and regular exercise- you're doing great!!! Call with any questions or concerns Stay Safe!  Stay Healthy!! Happy Spring!!!

## 2022-11-12 NOTE — Assessment & Plan Note (Signed)
Chronic problem.  Well controlled.  Currently asymptomatic.  Check labs but no anticipated med changes.

## 2022-11-12 NOTE — Assessment & Plan Note (Signed)
Pt's PE WNL.  UTD on DEXA, mammo, colonoscopy, flu, Tdap.  Check labs.  Anticipatory guidance provided.

## 2022-11-12 NOTE — Progress Notes (Signed)
   Subjective:    Patient ID: Alexis Wood, female    DOB: 1956/01/17, 67 y.o.   MRN: [id number]  HPI CPE- UTD on DEXA, mammo, colonoscopy, flu, Tdap  Patient Care Team    Relationship Specialty Notifications Start End  Midge Minium, MD PCP - General Family Medicine  01/12/15   Arvella Nigh, MD Consulting Physician Obstetrics and Gynecology  07/28/15   Erroll Luna, MD Consulting Physician General Surgery  08/30/15   Magrinat, Virgie Dad, MD (Inactive) Consulting Physician Oncology  07/31/17   Wonda Horner, MD Consulting Physician Gastroenterology  07/28/19      Health Maintenance  Topic Date Due   Medicare Annual Wellness (AWV)  01/02/2023   Pneumonia Vaccine 46+ Years old (1 of 2 - PCV) 01/02/2023 (Originally 06/08/1962)   DEXA SCAN  07/17/2023   MAMMOGRAM  07/18/2023   COLONOSCOPY (Pts 45-72yr Insurance coverage will need to be confirmed)  07/29/2024   DTaP/Tdap/Td (3 - Td or Tdap) 11/22/2028   INFLUENZA VACCINE  Completed   Hepatitis C Screening  Completed   Zoster Vaccines- Shingrix  Completed   HPV VACCINES  Aged Out   COVID-19 Vaccine  Discontinued      Review of Systems Patient reports no vision/ hearing changes, adenopathy,fever, persistant/recurrent hoarseness , swallowing issues, chest pain, palpitations, edema, persistant/recurrent cough, hemoptysis, dyspnea (rest/exertional/paroxysmal nocturnal), gastrointestinal bleeding (melena, rectal bleeding), abdominal pain, significant heartburn, bowel changes, GU symptoms (dysuria, hematuria, incontinence), Gyn symptoms (abnormal  bleeding, pain),  syncope, focal weakness, memory loss, numbness & tingling, skin/hair/nail changes, abnormal bruising or bleeding, anxiety, or depression.   + 5 lb weight loss    Objective:   Physical Exam General Appearance:    Alert, cooperative, no distress, appears stated age  Head:    Normocephalic, without obvious abnormality, atraumatic  Eyes:    PERRL, conjunctiva/corneas  clear, EOM's intact both eyes  Ears:    Normal TM's and external ear canals, both ears  Nose:   Nares normal, septum midline, mucosa normal, no drainage    or sinus tenderness  Throat:   Lips, mucosa, and tongue normal; teeth and gums normal  Neck:   Supple, symmetrical, trachea midline, no adenopathy;    Thyroid: no enlargement/tenderness/nodules  Back:     Symmetric, no curvature, ROM normal, no CVA tenderness  Lungs:     Clear to auscultation bilaterally, respirations unlabored  Chest Wall:    No tenderness or deformity   Heart:    Regular rate and rhythm, S1 and S2 normal, no murmur, rub   or gallop  Breast Exam:    Deferred to mammo  Abdomen:     Soft, non-tender, bowel sounds active all four quadrants,    no masses, no organomegaly  Genitalia:    Deferred  Rectal:    Extremities:   Extremities normal, atraumatic, no cyanosis or edema  Pulses:   2+ and symmetric all extremities  Skin:   Skin color, texture, turgor normal, no rashes or lesions  Lymph nodes:   Cervical, supraclavicular, and axillary nodes normal  Neurologic:   CNII-XII intact, normal strength, sensation and reflexes    throughout          Assessment & Plan:

## 2022-12-25 ENCOUNTER — Other Ambulatory Visit: Payer: Self-pay | Admitting: Family Medicine

## 2023-01-03 ENCOUNTER — Telehealth: Payer: Self-pay | Admitting: Family Medicine

## 2023-01-03 NOTE — Telephone Encounter (Signed)
Contacted EDNA REDE to schedule their annual wellness visit. Appointment made for 01/08/2023.  Thank you,  [facility] Support [facility] Direct dial  [phone]

## 2023-01-08 ENCOUNTER — Ambulatory Visit (INDEPENDENT_AMBULATORY_CARE_PROVIDER_SITE_OTHER): Payer: Medicare Other | Admitting: *Deleted

## 2023-01-08 DIAGNOSIS — Z0000 Encounter for general adult medical examination without abnormal findings: Secondary | ICD-10-CM

## 2023-01-08 NOTE — Progress Notes (Signed)
Subjective:   Alexis Wood is a 67 y.o. female who presents for an Initial Medicare Annual Wellness Visit.  I connected with  Alexis Wood on 01/08/23 by a telephone enabled telemedicine application and verified that I am speaking with the correct person using two identifiers.   I discussed the limitations of evaluation and management by telemedicine. The patient expressed understanding and agreed to proceed.  Patient location: home  Provider location: telephone/home   Review of Systems     Cardiac Risk Factors include: advanced age (>7men, >5 women);hypertension;sedentary lifestyle     Objective:    Today's Vitals   There is no height or weight on file to calculate BMI.     01/08/2023    8:37 AM 02/05/2018   12:25 PM 01/30/2018    9:38 AM 09/05/2015    9:21 AM 08/30/2015    2:38 PM  Advanced Directives  Does Patient Have a Medical Advance Directive? Yes Yes Yes Yes Yes  Type of Estate agent of [employer] Power of [place];Living will Healthcare Power of [place];Living will Healthcare Power of [place];Living will Healthcare Power of [place];Living will  Does patient want to make changes to medical advance directive?  No - Patient declined No - Patient declined    Copy of Healthcare Power of Attorney in Chart? No - copy requested No - copy requested  No - copy requested     Current Medications (verified) Outpatient Encounter Medications as of 01/08/2023  Medication Sig   ALPRAZolam (XANAX) 1 MG tablet TAKE 1 TABLET (1 MG TOTAL) BY MOUTH 2 (TWO) TIMES DAILY AS NEEDED. FOR ANXIETY   anastrozole (ARIMIDEX) 1 MG tablet Take 1 tablet (1 mg total) by mouth daily. Stop July 16, 2022   buPROPion (WELLBUTRIN XL) 300 MG 24 hr tablet TAKE 1 TABLET BY MOUTH EVERY DAY   levothyroxine (SYNTHROID) 125 MCG tablet TAKE 1 TABLET BY MOUTH EVERY DAY   losartan (COZAAR) 100 MG tablet TAKE 1 TABLET BY MOUTH EVERY DAY   Melatonin 10 MG CAPS Take  by mouth.   Multiple Vitamins-Minerals (PRESERVISION AREDS 2+MULTI VIT PO) Take by mouth.   sertraline (ZOLOFT) 100 MG tablet TAKE 1 TABLET BY MOUTH EVERY DAY   Vitamin D, Ergocalciferol, 2000 units CAPS Take by mouth.   No facility-administered encounter medications on file as of 01/08/2023.    Allergies (verified) Penicillins and Pristiq [desvenlafaxine]   History: Past Medical History:  Diagnosis Date   Anxiety    Arthritis    hands   Dental crowns present    Hypertension    states under control with med., has been on med. "several years"   Hypothyroidism    Intermediate stage nonexudative age-related macular degeneration of right eye 03/06/2020   The nature of age--related macular degeneration was discussed with the patient as well as the distinction between dry and wet types. Checking an [place] Grid daily with advice to return immediately should a distortion develop, was given to the patient. The patient 's smoking status now and in the past was determined and advice based on the AREDS study was provided regarding the consumption of antio   Macular degeneration    Mass of right breast 01/2018   Past Surgical History:  Procedure Laterality Date   BREAST EXCISIONAL BIOPSY Right 02/05/2018   sclerosing adenosis   BREAST EXCISIONAL BIOPSY Left 09/05/2015   BREAST LUMPECTOMY WITH NEEDLE LOCALIZATION Left 09/05/2015   Procedure: LEFT BREAST WIRE LOCALIZED  LUMPECTOMY TIMES 2;  Surgeon: Harriette Bouillon, MD;  Location: [facility];  Service: General;  Laterality: Left;   BREAST LUMPECTOMY WITH RADIOACTIVE SEED LOCALIZATION Right 02/05/2018   Procedure: RIGHT BREAST LUMPECTOMY WITH RADIOACTIVE SEED LOCALIZATION;  Surgeon: Harriette Bouillon, MD;  Location: [facility];  Service: General;  Laterality: Right;   cataract Bilateral    DILATATION & CURRETTAGE/HYSTEROSCOPY WITH RESECTOCOPE  02/21/2006   THYROIDECTOMY  Age 62   Family History  Problem Relation Age  of Onset   Coronary artery disease Mother    Hypertension Mother    Dementia Mother    Coronary artery disease Father    Hypertension Father    Diabetes Father    Breast cancer Maternal Aunt    Diabetes type I Other    Social History   Socioeconomic History   Marital status: Married    Spouse name: Not on file   Number of children: Not on file   Years of education: Not on file   Highest education level: Not on file  Occupational History   Not on file  Tobacco Use   Smoking status: Every Day    Packs/day: .25    Types: Cigarettes   Smokeless tobacco: Never  Vaping Use   Vaping Use: Never used  Substance and Sexual Activity   Alcohol use: No   Drug use: No   Sexual activity: Not Currently    Partners: Male    Birth control/protection: Post-menopausal  Other Topics Concern   Not on file  Social History Narrative   Not on file   Social Determinants of Health   Financial Resource Strain: Low Risk  (01/08/2023)   Overall Financial Resource Strain (CARDIA)    Difficulty of Paying Living Expenses: Not hard at all  Food Insecurity: No Food Insecurity (01/08/2023)   Hunger Vital Sign    Worried About Running Out of Food in the Last Year: Never true    Ran Out of Food in the Last Year: Never true  Transportation Needs: No Transportation Needs (01/08/2023)   PRAPARE - Administrator, Civil Service (Medical): No    Lack of Transportation (Non-Medical): No  Physical Activity: Inactive (01/08/2023)   Exercise Vital Sign    Days of Exercise per Week: 0 days    Minutes of Exercise per Session: 0 min  Stress: No Stress Concern Present (01/08/2023)   [employer] of Occupational Health - Occupational Stress Questionnaire    Feeling of Stress : Not at all  Social Connections: Socially Isolated (01/08/2023)   Social Connection and Isolation Panel [NHANES]    Frequency of Communication with Friends and Family: Once a week    Frequency of Social Gatherings with Friends  and Family: Once a week    Attends Religious Services: Never    Database administrator or Organizations: No    Attends Engineer, structural: Never    Marital Status: Married    Tobacco Counseling Ready to quit: Not Answered Counseling given: Not Answered   Clinical Intake:  Pre-visit preparation completed: Yes  Pain : No/denies pain     Diabetes: No  How often do you need to have someone help you when you read instructions, pamphlets, or other written materials from your doctor or pharmacy?: 1 - Never  Diabetic?  no  Interpreter Needed?: No  Information entered by :: Remi Haggard LPN   Activities of Daily Living    01/08/2023    8:41 AM 11/12/2022   10:20 AM  In your present state of health, do you have any difficulty performing the following activities:  Hearing? 0 0  Vision? 0 0  Difficulty concentrating or making decisions? 0 0  Walking or climbing stairs? 0 0  Dressing or bathing? 0 0  Doing errands, shopping? 0 0  Preparing Food and eating ? N   Using the Toilet? N   In the past six months, have you accidently leaked urine? N   Do you have problems with loss of bowel control? N   Managing your Medications? N   Managing your Finances? N   Housekeeping or managing your Housekeeping? N     Patient Care Team: Sheliah Hatch, MD as PCP - General (Family Medicine) Richardean Chimera, MD as Consulting Physician (Obstetrics and Gynecology) Harriette Bouillon, MD as Consulting Physician (General Surgery) Magrinat, Valentino Hue, MD (Inactive) as Consulting Physician (Oncology) Graylin Shiver, MD as Consulting Physician (Gastroenterology)  Indicate any recent Medical Services you may have received from other than [facility] providers in the past year (date may be approximate).     Assessment:   This is a routine wellness examination for Alexis Wood.  Hearing/Vision screen Hearing Screening - Comments:: No trouble hearing Vision Screening - Comments:: Dr. Dione Booze Macular  Degeneration  Dietary issues and exercise activities discussed: Current Exercise Habits: The patient does not participate in regular exercise at present   Goals Addressed             This Visit's Progress    Weight (lb) < 200 lb (90.7 kg)         Depression Screen    01/08/2023    8:43 AM 11/12/2022   10:19 AM 07/03/2022   10:29 AM 01/01/2022    9:58 AM 07/02/2021   10:40 AM 11/09/2020   10:03 AM 06/05/2020   11:00 AM  PHQ 2/9 Scores  PHQ - 2 Score 0 3 3 4 2  0 0  PHQ- 9 Score 2 8 5 9 6  0     Fall Risk    01/08/2023    8:37 AM 11/12/2022   10:19 AM 07/03/2022   10:29 AM 01/01/2022    9:58 AM 07/02/2021   10:39 AM  Fall Risk   Falls in the past year? 0 0 0 0 1  Number falls in past yr: 0 0 0 0 1  Injury with Fall? 0 0 0 0 0  Risk for fall due to :  No Fall Risks No Fall Risks No Fall Risks History of fall(s)  Follow up Falls evaluation completed;Education provided;Falls prevention discussed Falls evaluation completed Falls evaluation completed Falls evaluation completed Falls evaluation completed    FALL RISK PREVENTION PERTAINING TO THE HOME:  Any stairs in or around the home? Yes  If so, are there any without handrails? No  Home free of loose throw rugs in walkways, pet beds, electrical cords, etc? Yes  Adequate lighting in your home to reduce risk of falls? Yes   ASSISTIVE DEVICES UTILIZED TO PREVENT FALLS:  Life alert? No  Use of a cane, walker or w/c? No  Grab bars in the bathroom? No  Shower chair or bench in shower? No  Elevated toilet seat or a handicapped toilet? No   TIMED UP AND GO:  Was the test performed? No .    Cognitive Function:        01/08/2023    8:40 AM 01/01/2022    9:56 AM  6CIT Screen  What Year? 0 points 0 points  What month? 0 points 0 points  What time? 0 points 0 points  Count back from 20 0 points 0 points  Months in reverse 0 points 0 points  Repeat phrase 0 points 0 points  Total Score 0 points 0 points     Immunizations Immunization History  Administered Date(s) Administered   Fluad Quad(high Dose 65+) 07/02/2021, 07/03/2022   Influenza,inj,Quad PF,6+ Mos 06/08/2015, 06/02/2016, 06/13/2018, 06/17/2019, 06/05/2020   Influenza-Unspecified 06/14/2017   PFIZER(Purple Top)SARS-COV-2 Vaccination 12/16/2019, 01/10/2020   Td 09/17/2004   Tdap 11/23/2018   Zoster Recombinat (Shingrix) 04/11/2020, 11/09/2020   Zoster, Live 08/02/2016    TDAP status: Up to date  Flu Vaccine status: Up to date  Pneumococcal vaccine status: Due, Education has been provided regarding the importance of this vaccine. Advised may receive this vaccine at local pharmacy or Health Dept. Aware to provide a copy of the vaccination record if obtained from local pharmacy or Health Dept. Verbalized acceptance and understanding.  Covid-19 vaccine status: Declined, Education has been provided regarding the importance of this vaccine but patient still declined. Advised may receive this vaccine at local pharmacy or Health Dept.or vaccine clinic. Aware to provide a copy of the vaccination record if obtained from local pharmacy or Health Dept. Verbalized acceptance and understanding.  Qualifies for Shingles Vaccine? No   Zostavax completed No   Shingrix Completed?: Yes  Screening Tests Health Maintenance  Topic Date Due   Pneumonia Vaccine 17+ Years old (1 of 2 - PCV) Never done   INFLUENZA VACCINE  04/17/2023   DEXA SCAN  07/17/2023   MAMMOGRAM  07/18/2023   Medicare Annual Wellness (AWV)  01/08/2024   COLONOSCOPY (Pts 45-76yrs Insurance coverage will need to be confirmed)  07/29/2024   DTaP/Tdap/Td (3 - Td or Tdap) 11/22/2028   Hepatitis C Screening  Completed   Zoster Vaccines- Shingrix  Completed   HPV VACCINES  Aged Out   COVID-19 Vaccine  Discontinued    Health Maintenance  Health Maintenance Due  Topic Date Due   Pneumonia Vaccine 42+ Years old (1 of 2 - PCV) Never done    Colorectal cancer screening:  Type of screening: Colonoscopy. Completed 2020. Repeat every 5 years  Mammogram status: Completed  . Repeat every year  Bone Density status: Completed 2022. Results reflect: Bone density results: OSTEOPOROSIS. Repeat every 2 years.  Lung Cancer Screening: (Low Dose CT Chest recommended if Age 61-80 years, 30 pack-year currently smoking OR have quit w/in 15years.) does not qualify.   Lung Cancer Screening Referral:   Additional Screening:  Hepatitis C Screening: does not qualify; Completed 2018  Vision Screening: Recommended annual ophthalmology exams for early detection of glaucoma and other disorders of the eye. Is the patient up to date with their annual eye exam?  Yes  Who is the provider or what is the name of the office in which the patient attends annual eye exams? [facility] If pt is not established with a provider, would they like to be referred to a provider to establish care? No .   Dental Screening: Recommended annual dental exams for proper oral hygiene  Community Resource Referral / Chronic Care Management: CRR required this visit?  No   CCM required this visit?  No      Plan:     I have personally reviewed and noted the following in the patient's chart:   Medical and social history Use of alcohol, tobacco or illicit drugs  Current medications and supplements including opioid prescriptions. Patient is  not currently taking opioid prescriptions. Functional ability and status Nutritional status Physical activity Advanced directives List of other physicians Hospitalizations, surgeries, and ER visits in previous 12 months Vitals Screenings to include cognitive, depression, and falls Referrals and appointments  In addition, I have reviewed and discussed with patient certain preventive protocols, quality metrics, and best practice recommendations. A written personalized care plan for preventive services as well as general preventive health recommendations were provided  to patient.     Remi Haggard, LPN   12/23/8117   Nurse Notes:

## 2023-01-08 NOTE — Patient Instructions (Signed)
[name] , Thank you for taking time to come for your Medicare Wellness Visit. I appreciate your ongoing commitment to your health goals. Please review the following plan we discussed and let me know if I can assist you in the future.   Screening recommendations/referrals: Colonoscopy: up to date Mammogram: up to date Bone Density: Education provided Recommended yearly ophthalmology/optometry visit for glaucoma screening and checkup Recommended yearly dental visit for hygiene and checkup  Vaccinations: Influenza vaccine: up to date Pneumococcal vaccine: Education provided Tdap vaccine: up to date Shingles vaccine: up to date    Advanced directives: Education provided     Preventive Care 65 Years and Older, Female Preventive care refers to lifestyle choices and visits with your health care provider that can promote health and wellness. What does preventive care include? A yearly physical exam. This is also called an annual well check. Dental exams once or twice a year. Routine eye exams. Ask your health care provider how often you should have your eyes checked. Personal lifestyle choices, including: Daily care of your teeth and gums. Regular physical activity. Eating a healthy diet. Avoiding tobacco and drug use. Limiting alcohol use. Practicing safe sex. Taking low-dose aspirin every day. Taking vitamin and mineral supplements as recommended by your health care provider. What happens during an annual well check? The services and screenings done by your health care provider during your annual well check will depend on your age, overall health, lifestyle risk factors, and family history of disease. Counseling  Your health care provider may ask you questions about your: Alcohol use. Tobacco use. Drug use. Emotional well-being. Home and relationship well-being. Sexual activity. Eating habits. History of falls. Memory and ability to understand (cognition). Work and  work Astronomer. Reproductive health. Screening  You may have the following tests or measurements: Height, weight, and BMI. Blood pressure. Lipid and cholesterol levels. These may be checked every 5 years, or more frequently if you are over 81 years old. Skin check. Lung cancer screening. You may have this screening every year starting at age 69 if you have a 30-pack-year history of smoking and currently smoke or have quit within the past 15 years. Fecal occult blood test (FOBT) of the stool. You may have this test every year starting at age 22. Flexible sigmoidoscopy or colonoscopy. You may have a sigmoidoscopy every 5 years or a colonoscopy every 10 years starting at age 108. Hepatitis C blood test. Hepatitis B blood test. Sexually transmitted disease (STD) testing. Diabetes screening. This is done by checking your blood sugar (glucose) after you have not eaten for a while (fasting). You may have this done every 1-3 years. Bone density scan. This is done to screen for osteoporosis. You may have this done starting at age 66. Mammogram. This may be done every 1-2 years. Talk to your health care provider about how often you should have regular mammograms. Talk with your health care provider about your test results, treatment options, and if necessary, the need for more tests. Vaccines  Your health care provider may recommend certain vaccines, such as: Influenza vaccine. This is recommended every year. Tetanus, diphtheria, and acellular pertussis (Tdap, Td) vaccine. You may need a Td booster every 10 years. Zoster vaccine. You may need this after age 43. Pneumococcal 13-valent conjugate (PCV13) vaccine. One dose is recommended after age 53. Pneumococcal polysaccharide (PPSV23) vaccine. One dose is recommended after age 58. Talk to your health care provider about which screenings and vaccines you need and how often  you need them. This information is not intended to replace advice given to you  by your health care provider. Make sure you discuss any questions you have with your health care provider. Document Released: 09/29/2015 Document Revised: 05/22/2016 Document Reviewed: 07/04/2015 Elsevier Interactive Patient Education  2017 [place] Prevention in the Home Falls can cause injuries. They can happen to people of all ages. There are many things you can do to make your home safe and to help prevent falls. What can I do on the outside of my home? Regularly fix the edges of walkways and driveways and fix any cracks. Remove anything that might make you trip as you walk through a door, such as a raised step or threshold. Trim any bushes or trees on the path to your home. Use bright outdoor lighting. Clear any walking paths of anything that might make someone trip, such as rocks or tools. Regularly check to see if handrails are loose or broken. Make sure that both sides of any steps have handrails. Any raised decks and porches should have guardrails on the edges. Have any leaves, snow, or ice cleared regularly. Use sand or salt on walking paths during winter. Clean up any spills in your garage right away. This includes oil or grease spills. What can I do in the bathroom? Use night lights. Install grab bars by the toilet and in the tub and shower. Do not use towel bars as grab bars. Use non-skid mats or decals in the tub or shower. If you need to sit down in the shower, use a plastic, non-slip stool. Keep the floor dry. Clean up any water that spills on the floor as soon as it happens. Remove soap buildup in the tub or shower regularly. Attach bath mats securely with double-sided non-slip rug tape. Do not have throw rugs and other things on the floor that can make you trip. What can I do in the bedroom? Use night lights. Make sure that you have a light by your bed that is easy to reach. Do not use any sheets or blankets that are too big for your bed. They should not  hang down onto the floor. Have a firm chair that has side arms. You can use this for support while you get dressed. Do not have throw rugs and other things on the floor that can make you trip. What can I do in the kitchen? Clean up any spills right away. Avoid walking on wet floors. Keep items that you use a lot in easy-to-reach places. If you need to reach something above you, use a strong step stool that has a grab bar. Keep electrical cords out of the way. Do not use floor polish or wax that makes floors slippery. If you must use wax, use non-skid floor wax. Do not have throw rugs and other things on the floor that can make you trip. What can I do with my stairs? Do not leave any items on the stairs. Make sure that there are handrails on both sides of the stairs and use them. Fix handrails that are broken or loose. Make sure that handrails are as long as the stairways. Check any carpeting to make sure that it is firmly attached to the stairs. Fix any carpet that is loose or worn. Avoid having throw rugs at the top or bottom of the stairs. If you do have throw rugs, attach them to the floor with carpet tape. Make sure that you have a light  switch at the top of the stairs and the bottom of the stairs. If you do not have them, ask someone to add them for you. What else can I do to help prevent falls? Wear shoes that: Do not have high heels. Have rubber bottoms. Are comfortable and fit you well. Are closed at the toe. Do not wear sandals. If you use a stepladder: Make sure that it is fully opened. Do not climb a closed stepladder. Make sure that both sides of the stepladder are locked into place. Ask someone to hold it for you, if possible. Clearly mark and make sure that you can see: Any grab bars or handrails. First and last steps. Where the edge of each step is. Use tools that help you move around (mobility aids) if they are needed. These  include: Canes. Walkers. Scooters. Crutches. Turn on the lights when you go into a dark area. Replace any light bulbs as soon as they burn out. Set up your furniture so you have a clear path. Avoid moving your furniture around. If any of your floors are uneven, fix them. If there are any pets around you, be aware of where they are. Review your medicines with your doctor. Some medicines can make you feel dizzy. This can increase your chance of falling. Ask your doctor what other things that you can do to help prevent falls. This information is not intended to replace advice given to you by your health care provider. Make sure you discuss any questions you have with your health care provider. Document Released: 06/29/2009 Document Revised: 02/08/2016 Document Reviewed: 10/07/2014 Elsevier Interactive Patient Education  2017 [employer].

## 2023-01-10 ENCOUNTER — Other Ambulatory Visit: Payer: Self-pay | Admitting: Family Medicine

## 2023-01-10 DIAGNOSIS — I10 Essential (primary) hypertension: Secondary | ICD-10-CM

## 2023-02-11 ENCOUNTER — Telehealth: Payer: Self-pay | Admitting: Family Medicine

## 2023-02-11 MED ORDER — VARENICLINE TARTRATE (STARTER) 0.5 MG X 11 & 1 MG X 42 PO TBPK: ORAL_TABLET | ORAL | 0 refills | Status: DC

## 2023-02-11 NOTE — Telephone Encounter (Signed)
Patient called and would a prescription for Chantix states she has been on It before.

## 2023-02-11 NOTE — Addendum Note (Signed)
Addended by: Sheliah Hatch on: 02/11/2023 12:01 PM   Modules accepted: Orders

## 2023-02-11 NOTE — Telephone Encounter (Signed)
Prescription for starting dose of Chantix sent to pharmacy

## 2023-02-11 NOTE — Telephone Encounter (Signed)
Left vm stating Rx has been sent in  

## 2023-02-11 NOTE — Telephone Encounter (Signed)
Can we send in a Rx for chantix ?

## 2023-03-03 ENCOUNTER — Other Ambulatory Visit: Payer: Self-pay | Admitting: Family Medicine

## 2023-03-07 ENCOUNTER — Other Ambulatory Visit: Payer: Self-pay | Admitting: Family Medicine

## 2023-03-07 MED ORDER — VARENICLINE TARTRATE 1 MG PO TABS: 1.0000 mg | ORAL_TABLET | Freq: Two times a day (BID) | ORAL | 3 refills | Status: DC

## 2023-03-07 MED ORDER — ALPRAZOLAM 1 MG PO TABS: 1.0000 mg | ORAL_TABLET | Freq: Two times a day (BID) | ORAL | 3 refills | Status: DC | PRN

## 2023-03-07 NOTE — Telephone Encounter (Signed)
Pt aware of refill.

## 2023-03-07 NOTE — Telephone Encounter (Signed)
Patient calls stating she is at [facility] and they are saying that her ALPRAZolam Alexis Wood) 1 MG tablet  prescription has expired. Pt states she does need that. Also, they are giving the patient Varenicline Tartrate, Starter, 0.5 MG X 11 & 1 MG X 42 TBPK  which she already has had the starter she needs the continuing. Please Advise.

## 2023-03-07 NOTE — Telephone Encounter (Signed)
Prescription for Xanax refilled, new chantix prescription sent

## 2023-03-07 NOTE — Telephone Encounter (Signed)
Xanax 1 mg LOV: 11/12/22 Last Refill:09/05/22 Upcoming appt: 826/24  Pt also needs something for smoking has the starter Chantix pack but something continuously

## 2023-04-14 NOTE — Assessment & Plan Note (Signed)
Had long discussion today regarding mammograms.  She reports that if there was something present, she would want to act on it.  Based on that, we need to keep doing mammograms.  [facility] and I would sign any new orders going forward.  Pt expressed understanding and is in agreement w/ plan.

## 2023-04-14 NOTE — Assessment & Plan Note (Signed)
Currently doing well on Wellbutrin 300mg  XL daily, Sertraline 100mg  daily, and Alprazolam prn.  No changes at this time.  Refill on Sertraline provided.

## 2023-04-16 ENCOUNTER — Encounter (INDEPENDENT_AMBULATORY_CARE_PROVIDER_SITE_OTHER): Payer: Self-pay

## 2023-05-12 ENCOUNTER — Ambulatory Visit (INDEPENDENT_AMBULATORY_CARE_PROVIDER_SITE_OTHER): Payer: Medicare Other | Admitting: Family Medicine

## 2023-05-12 ENCOUNTER — Encounter: Payer: Self-pay | Admitting: Family Medicine

## 2023-05-12 ENCOUNTER — Telehealth: Payer: Self-pay

## 2023-05-12 ENCOUNTER — Ambulatory Visit: Payer: Medicare Other | Admitting: Family Medicine

## 2023-05-12 VITALS — BP 128/78 | HR 84 | Temp 98.7°F | Resp 17 | Ht 63.0 in | Wt 154.2 lb

## 2023-05-12 DIAGNOSIS — E039 Hypothyroidism, unspecified: Secondary | ICD-10-CM

## 2023-05-12 DIAGNOSIS — E663 Overweight: Secondary | ICD-10-CM

## 2023-05-12 DIAGNOSIS — E785 Hyperlipidemia, unspecified: Secondary | ICD-10-CM

## 2023-05-12 LAB — CBC WITH DIFFERENTIAL/PLATELET
Basophils Absolute: 0.1 10*3/uL (ref 0.0–0.1)
Basophils Relative: 0.6 % (ref 0.0–3.0)
Eosinophils Absolute: 0.5 10*3/uL (ref 0.0–0.7)
Eosinophils Relative: 5.5 % — ABNORMAL HIGH (ref 0.0–5.0)
HCT: 45.5 % (ref 36.0–46.0)
Hemoglobin: 14.7 g/dL (ref 12.0–15.0)
Lymphocytes Relative: 33.1 % (ref 12.0–46.0)
Lymphs Abs: 2.9 10*3/uL (ref 0.7–4.0)
MCHC: 32.3 g/dL (ref 30.0–36.0)
MCV: 88.6 fl (ref 78.0–100.0)
Monocytes Absolute: 0.5 10*3/uL (ref 0.1–1.0)
Monocytes Relative: 6 % (ref 3.0–12.0)
Neutro Abs: 4.7 10*3/uL (ref 1.4–7.7)
Neutrophils Relative %: 54.8 % (ref 43.0–77.0)
Platelets: 204 10*3/uL (ref 150.0–400.0)
RBC: 5.14 Mil/uL — ABNORMAL HIGH (ref 3.87–5.11)
RDW: 12.8 % (ref 11.5–15.5)
WBC: 8.7 10*3/uL (ref 4.0–10.5)

## 2023-05-12 LAB — LIPID PANEL
Cholesterol: 219 mg/dL — ABNORMAL HIGH (ref 0–200)
HDL: 67.9 mg/dL (ref >39.00)
LDL Cholesterol: 121 mg/dL — ABNORMAL HIGH (ref 0–99)
NonHDL: 150.72
Total CHOL/HDL Ratio: 3
Triglycerides: 151 mg/dL — ABNORMAL HIGH (ref 0.0–149.0)
VLDL: 30.2 mg/dL (ref 0.0–40.0)

## 2023-05-12 LAB — BASIC METABOLIC PANEL
BUN: 22 mg/dL (ref 6–23)
CO2: 29 mEq/L (ref 19–32)
Calcium: 9.3 mg/dL (ref 8.4–10.5)
Chloride: 101 mEq/L (ref 96–112)
Creatinine, Ser: 0.79 mg/dL (ref 0.40–1.20)
GFR: 77.67 mL/min (ref >60.00)
Glucose, Bld: 85 mg/dL (ref 70–99)
Potassium: 4.7 mEq/L (ref 3.5–5.1)
Sodium: 139 mEq/L (ref 135–145)

## 2023-05-12 LAB — HEPATIC FUNCTION PANEL
ALT: 17 U/L (ref 0–35)
AST: 18 U/L (ref 0–37)
Albumin: 4.4 g/dL (ref 3.5–5.2)
Alkaline Phosphatase: 89 U/L (ref 39–117)
Bilirubin, Direct: 0.1 mg/dL (ref 0.0–0.3)
Total Bilirubin: 0.6 mg/dL (ref 0.2–1.2)
Total Protein: 6.9 g/dL (ref 6.0–8.3)

## 2023-05-12 LAB — TSH: TSH: 0.39 u[IU]/mL (ref 0.35–5.50)

## 2023-05-12 NOTE — Telephone Encounter (Signed)
Pt is aware of results. 

## 2023-05-12 NOTE — Assessment & Plan Note (Signed)
Pt is down 10 lbs since last visit.  She has been working on this.  Applauded her efforts.  Will continue to follow.

## 2023-05-12 NOTE — Telephone Encounter (Signed)
-----   Message from Neena Rhymes sent at 05/12/2023  3:41 PM EDT ----- Labs look good!  Total cholesterol and LDL (bad cholesterol) have both increased somewhat but this will improve w/ regular physical activity/exercise.  No med changes at this time

## 2023-05-12 NOTE — Progress Notes (Signed)
   Subjective:    Patient ID: Alexis Wood, female    DOB: Jun 01, 1956, 67 y.o.   MRN: [id number]  HPI HTN- chronic problem, on Losartan 100mg  daily.  No CP, SOB, HA's, visual changes, edema.  Hypothyroid- chronic problem, on Levothyroxine daily.  Denies changes to skin/hair/nails.  Overweight- pt is down 10 lbs.  BMI now 27.32  pt has been actively working on weight loss.  Is active doing yard work and cleaning.  Declines PNA vaccine.   Review of Systems For ROS see HPI     Objective:   Physical Exam Vitals reviewed.  Constitutional:      General: She is not in acute distress.    Appearance: Normal appearance. She is well-developed. She is not ill-appearing.  HENT:     Head: Normocephalic and atraumatic.  Eyes:     Conjunctiva/sclera: Conjunctivae normal.     Pupils: Pupils are equal, round, and reactive to light.  Neck:     Thyroid: No thyromegaly.  Cardiovascular:     Rate and Rhythm: Normal rate and regular rhythm.     Pulses: Normal pulses.     Heart sounds: Normal heart sounds. No murmur heard. Pulmonary:     Effort: Pulmonary effort is normal. No respiratory distress.     Breath sounds: Normal breath sounds.  Abdominal:     General: There is no distension.     Palpations: Abdomen is soft.     Tenderness: There is no abdominal tenderness.  Musculoskeletal:     Cervical back: Normal range of motion and neck supple.     Right lower leg: No edema.     Left lower leg: No edema.  Lymphadenopathy:     Cervical: No cervical adenopathy.  Skin:    General: Skin is warm and dry.  Neurological:     General: No focal deficit present.     Mental Status: She is alert and oriented to person, place, and time.  Psychiatric:        Mood and Affect: Mood normal.        Behavior: Behavior normal.        Thought Content: Thought content normal.           Assessment & Plan:

## 2023-05-12 NOTE — Patient Instructions (Signed)
Schedule your complete physical in 6 months We'll notify you of your lab results and make any changes if needed Keep up the good work on healthy diet and regular exercise- you look great!! Call with any questions or concerns Enjoy the rest of your summer!!! 

## 2023-05-12 NOTE — Assessment & Plan Note (Signed)
Chronic problem.  Attempting to control w/ diet and exercise.  Check labs and determine if medication is needed 

## 2023-05-12 NOTE — Assessment & Plan Note (Signed)
Chronic problem.  Currently asymptomatic on Levothyroxine daily.  Check labs.  Adjust meds prn

## 2023-06-03 ENCOUNTER — Other Ambulatory Visit: Payer: Self-pay | Admitting: Family Medicine

## 2023-06-03 DIAGNOSIS — Z1231 Encounter for screening mammogram for malignant neoplasm of breast: Secondary | ICD-10-CM

## 2023-06-21 ENCOUNTER — Other Ambulatory Visit: Payer: Self-pay | Admitting: Family Medicine

## 2023-06-30 ENCOUNTER — Other Ambulatory Visit: Payer: Self-pay | Admitting: Family Medicine

## 2023-06-30 DIAGNOSIS — I10 Essential (primary) hypertension: Secondary | ICD-10-CM

## 2023-07-21 ENCOUNTER — Ambulatory Visit
Admission: RE | Admit: 2023-07-21 | Discharge: 2023-07-21 | Disposition: A | Discharge status: Home / Self Care | Payer: Medicare Other | Source: Ambulatory Visit | Attending: Family Medicine | Admitting: Family Medicine

## 2023-07-21 DIAGNOSIS — Z1231 Encounter for screening mammogram for malignant neoplasm of breast: Secondary | ICD-10-CM

## 2023-09-13 ENCOUNTER — Other Ambulatory Visit: Payer: Self-pay | Admitting: Family Medicine

## 2023-09-13 DIAGNOSIS — E039 Hypothyroidism, unspecified: Secondary | ICD-10-CM

## 2023-10-26 ENCOUNTER — Other Ambulatory Visit: Payer: Self-pay | Admitting: Family Medicine

## 2023-10-27 NOTE — Telephone Encounter (Signed)
 Requested Prescriptions   Pending Prescriptions Disp Refills   ALPRAZolam  (XANAX ) 1 MG tablet [Pharmacy Med Name: ALPRAZOLAM  1 MG TABLET] 60 tablet     Sig: Take 1 tablet (1 mg total) by mouth 2 (two) times daily as needed. for anxiety     Date of patient request: 10/27/2023 Last office visit: 05/12/2023 Upcoming visit: 11/11/2023 Date of last refill: 03/07/2023 Last refill amount: 60

## 2023-11-11 ENCOUNTER — Ambulatory Visit (INDEPENDENT_AMBULATORY_CARE_PROVIDER_SITE_OTHER): Payer: Medicare Other | Admitting: Family Medicine

## 2023-11-11 ENCOUNTER — Other Ambulatory Visit: Payer: Self-pay

## 2023-11-11 ENCOUNTER — Encounter: Payer: Self-pay | Admitting: Family Medicine

## 2023-11-11 VITALS — BP 108/70 | HR 77 | Temp 99.1°F | Ht 63.0 in | Wt 154.4 lb

## 2023-11-11 DIAGNOSIS — Z0000 Encounter for general adult medical examination without abnormal findings: Secondary | ICD-10-CM | POA: Diagnosis not present

## 2023-11-11 DIAGNOSIS — E559 Vitamin D deficiency, unspecified: Secondary | ICD-10-CM | POA: Diagnosis not present

## 2023-11-11 DIAGNOSIS — I10 Essential (primary) hypertension: Secondary | ICD-10-CM | POA: Diagnosis not present

## 2023-11-11 DIAGNOSIS — Z8639 Personal history of other endocrine, nutritional and metabolic disease: Secondary | ICD-10-CM | POA: Diagnosis not present

## 2023-11-11 LAB — CBC WITH DIFFERENTIAL/PLATELET
Basophils Absolute: 0 10*3/uL (ref 0.0–0.1)
Basophils Relative: 0.5 % (ref 0.0–3.0)
Eosinophils Absolute: 0.7 10*3/uL (ref 0.0–0.7)
Eosinophils Relative: 6.4 % — ABNORMAL HIGH (ref 0.0–5.0)
HCT: 45.8 % (ref 36.0–46.0)
Hemoglobin: 15.2 g/dL — ABNORMAL HIGH (ref 12.0–15.0)
Lymphocytes Relative: 33.6 % (ref 12.0–46.0)
Lymphs Abs: 3.6 10*3/uL (ref 0.7–4.0)
MCHC: 33.2 g/dL (ref 30.0–36.0)
MCV: 88.9 fl (ref 78.0–100.0)
Monocytes Absolute: 0.6 10*3/uL (ref 0.1–1.0)
Monocytes Relative: 5.7 % (ref 3.0–12.0)
Neutro Abs: 5.7 10*3/uL (ref 1.4–7.7)
Neutrophils Relative %: 53.8 % (ref 43.0–77.0)
Platelets: 215 10*3/uL (ref 150.0–400.0)
RBC: 5.15 Mil/uL — ABNORMAL HIGH (ref 3.87–5.11)
RDW: 12.8 % (ref 11.5–15.5)
WBC: 10.7 10*3/uL — ABNORMAL HIGH (ref 4.0–10.5)

## 2023-11-11 LAB — BASIC METABOLIC PANEL
BUN: 26 mg/dL — ABNORMAL HIGH (ref 6–23)
CO2: 29 meq/L (ref 19–32)
Calcium: 9.5 mg/dL (ref 8.4–10.5)
Chloride: 100 meq/L (ref 96–112)
Creatinine, Ser: 0.92 mg/dL (ref 0.40–1.20)
GFR: 64.47 mL/min (ref >60.00)
Glucose, Bld: 77 mg/dL (ref 70–99)
Potassium: 4.4 meq/L (ref 3.5–5.1)
Sodium: 139 meq/L (ref 135–145)

## 2023-11-11 LAB — HEPATIC FUNCTION PANEL
ALT: 23 U/L (ref 0–35)
AST: 22 U/L (ref 0–37)
Albumin: 4.7 g/dL (ref 3.5–5.2)
Alkaline Phosphatase: 83 U/L (ref 39–117)
Bilirubin, Direct: 0.1 mg/dL (ref 0.0–0.3)
Total Bilirubin: 0.5 mg/dL (ref 0.2–1.2)
Total Protein: 7.7 g/dL (ref 6.0–8.3)

## 2023-11-11 LAB — HEMOGLOBIN A1C: Hgb A1c MFr Bld: 5.7 % (ref 4.6–6.5)

## 2023-11-11 LAB — LIPID PANEL
Cholesterol: 219 mg/dL — ABNORMAL HIGH (ref 0–200)
HDL: 79.9 mg/dL (ref >39.00)
LDL Cholesterol: 110 mg/dL — ABNORMAL HIGH (ref 0–99)
NonHDL: 138.91
Total CHOL/HDL Ratio: 3
Triglycerides: 146 mg/dL (ref 0.0–149.0)
VLDL: 29.2 mg/dL (ref 0.0–40.0)

## 2023-11-11 LAB — TSH: TSH: 0.44 u[IU]/mL (ref 0.35–5.50)

## 2023-11-11 LAB — VITAMIN D 25 HYDROXY (VIT D DEFICIENCY, FRACTURES): VITD: 49.04 ng/mL (ref 30.00–100.00)

## 2023-11-11 NOTE — Patient Instructions (Addendum)
 Follow up in 6 months to recheck blood pressure and cholesterol We'll notify you of your lab results and make any changes if needed Continue to work on healthy diet and regular exercise- you can do it! Call with any questions or concerns Stay Safe!  Stay Healthy! Happy Spring!!

## 2023-11-11 NOTE — Assessment & Plan Note (Signed)
 Pt's PE WNL.  She quit smoking!  Applauded her efforts.  UTD on mammo, colonoscopy, DEXA.  Check labs.  Anticipatory guidance provided.

## 2023-11-11 NOTE — Progress Notes (Signed)
   Subjective:    Patient ID: Alexis Wood, female    DOB: 1956-01-19, 68 y.o.   MRN: [id number]  HPI CPE- UTD on mammo, colonoscopy.  Will request DEXA report.  Declines PNA.  Quit smoking 9/30  Patient Care Team    Relationship Specialty Notifications Start End  Sheliah Hatch, MD PCP - General Family Medicine  01/12/15   Richardean Chimera, MD Consulting Physician Obstetrics and Gynecology  07/28/15   Harriette Bouillon, MD Consulting Physician General Surgery  08/30/15   Magrinat, Valentino Hue, MD (Inactive) Consulting Physician Oncology  07/31/17   Graylin Shiver, MD Consulting Physician Gastroenterology  07/28/19   Gaylord Shih, [facility]  Specialist  11/11/23      Health Maintenance  Topic Date Due   Pneumonia Vaccine 13+ Years old (1 of 2 - PCV) Never done   INFLUENZA VACCINE  04/17/2023   COVID-19 Vaccine (3 - 2024-25 season) 05/18/2023   DEXA SCAN  07/17/2023   Medicare Annual Wellness (AWV)  01/08/2024   MAMMOGRAM  07/20/2024   Colonoscopy  07/29/2024   DTaP/Tdap/Td (3 - Td or Tdap) 11/22/2028   Hepatitis C Screening  Completed   Zoster Vaccines- Shingrix  Completed   HPV VACCINES  Aged Out      Review of Systems Patient reports no vision/ hearing changes, adenopathy, fever, weight change,  persistant/recurrent hoarseness , swallowing issues, chest pain, palpitations, edema, persistant/recurrent cough, hemoptysis, dyspnea (rest/exertional/paroxysmal nocturnal), gastrointestinal bleeding (melena, rectal bleeding), abdominal pain, significant heartburn, bowel changes, GU symptoms (dysuria, hematuria, incontinence), Gyn symptoms (abnormal  bleeding, pain),  syncope, focal weakness, memory loss, numbness & tingling, skin/hair/nail changes, abnormal bruising or bleeding, anxiety, or depression.     Objective:   Physical Exam General Appearance:    Alert, cooperative, no distress, appears stated age  Head:    Normocephalic, without obvious abnormality, atraumatic  Eyes:    PERRL,  conjunctiva/corneas clear, EOM's intact both eyes  Ears:    Normal TM's and external ear canals, both ears  Nose:   Nares normal, septum midline, mucosa normal, no drainage    or sinus tenderness  Throat:   Lips, mucosa, and tongue normal; teeth and gums normal  Neck:   Supple, symmetrical, trachea midline, no adenopathy;    Thyroid: no enlargement/tenderness/nodules  Back:     Symmetric, no curvature, ROM normal, no CVA tenderness  Lungs:     Clear to auscultation bilaterally, respirations unlabored  Chest Wall:    No tenderness or deformity   Heart:    Regular rate and rhythm, S1 and S2 normal, no murmur, rub   or gallop  Breast Exam:    Deferred to GYN  Abdomen:     Soft, non-tender, bowel sounds active all four quadrants,    no masses, no organomegaly  Genitalia:    Deferred to GYN  Rectal:    Extremities:   Extremities normal, atraumatic, no cyanosis or edema  Pulses:   2+ and symmetric all extremities  Skin:   Skin color, texture, turgor normal, no rashes or lesions  Lymph nodes:   Cervical, supraclavicular, and axillary nodes normal  Neurologic:   CNII-XII intact, normal strength, sensation and reflexes    throughout          Assessment & Plan:

## 2023-11-12 ENCOUNTER — Encounter: Payer: Self-pay | Admitting: Family Medicine

## 2023-12-11 ENCOUNTER — Telehealth: Payer: Self-pay

## 2023-12-11 NOTE — Telephone Encounter (Signed)
 Copied from CRM [id number]. Topic: Appointments - Appointment Scheduling >> Dec 11, 2023 11:26 AM Drema Balzarine wrote: Patient returning Patricia's phone call to schedule AWV, FYI - patient declined

## 2023-12-12 ENCOUNTER — Other Ambulatory Visit: Payer: Self-pay | Admitting: Family Medicine

## 2023-12-12 DIAGNOSIS — E039 Hypothyroidism, unspecified: Secondary | ICD-10-CM

## 2023-12-17 ENCOUNTER — Other Ambulatory Visit: Payer: Self-pay | Admitting: Family Medicine

## 2023-12-17 DIAGNOSIS — I10 Essential (primary) hypertension: Secondary | ICD-10-CM

## 2024-05-10 ENCOUNTER — Encounter: Payer: Self-pay | Admitting: Family Medicine

## 2024-05-10 ENCOUNTER — Ambulatory Visit (INDEPENDENT_AMBULATORY_CARE_PROVIDER_SITE_OTHER): Payer: Medicare Other | Admitting: Family Medicine

## 2024-05-10 VITALS — BP 132/82 | HR 78 | Temp 98.0°F | Ht 63.0 in | Wt 161.2 lb

## 2024-05-10 DIAGNOSIS — E663 Overweight: Secondary | ICD-10-CM

## 2024-05-10 DIAGNOSIS — I10 Essential (primary) hypertension: Secondary | ICD-10-CM | POA: Diagnosis not present

## 2024-05-10 DIAGNOSIS — F419 Anxiety disorder, unspecified: Secondary | ICD-10-CM

## 2024-05-10 DIAGNOSIS — F32A Depression, unspecified: Secondary | ICD-10-CM

## 2024-05-10 DIAGNOSIS — E039 Hypothyroidism, unspecified: Secondary | ICD-10-CM

## 2024-05-10 LAB — BASIC METABOLIC PANEL WITH GFR
BUN: 23 mg/dL (ref 6–23)
CO2: 29 meq/L (ref 19–32)
Calcium: 9.7 mg/dL (ref 8.4–10.5)
Chloride: 99 meq/L (ref 96–112)
Creatinine, Ser: 0.9 mg/dL (ref 0.40–1.20)
GFR: 65.96 mL/min (ref >60.00)
Glucose, Bld: 79 mg/dL (ref 70–99)
Potassium: 4.5 meq/L (ref 3.5–5.1)
Sodium: 139 meq/L (ref 135–145)

## 2024-05-10 LAB — TSH: TSH: 0.59 u[IU]/mL (ref 0.35–5.50)

## 2024-05-10 LAB — CBC WITH DIFFERENTIAL/PLATELET
Basophils Absolute: 0.1 K/uL (ref 0.0–0.1)
Basophils Relative: 0.6 % (ref 0.0–3.0)
Eosinophils Absolute: 0.5 K/uL (ref 0.0–0.7)
Eosinophils Relative: 5.6 % — ABNORMAL HIGH (ref 0.0–5.0)
HCT: 43.2 % (ref 36.0–46.0)
Hemoglobin: 14.4 g/dL (ref 12.0–15.0)
Lymphocytes Relative: 34.4 % (ref 12.0–46.0)
Lymphs Abs: 3.2 K/uL (ref 0.7–4.0)
MCHC: 33.2 g/dL (ref 30.0–36.0)
MCV: 87.5 fl (ref 78.0–100.0)
Monocytes Absolute: 0.4 K/uL (ref 0.1–1.0)
Monocytes Relative: 4.3 % (ref 3.0–12.0)
Neutro Abs: 5.2 K/uL (ref 1.4–7.7)
Neutrophils Relative %: 55.1 % (ref 43.0–77.0)
Platelets: 205 K/uL (ref 150.0–400.0)
RBC: 4.94 Mil/uL (ref 3.87–5.11)
RDW: 12.6 % (ref 11.5–15.5)
WBC: 9.4 K/uL (ref 4.0–10.5)

## 2024-05-10 LAB — LIPID PANEL
Cholesterol: 211 mg/dL — ABNORMAL HIGH (ref 0–200)
HDL: 90 mg/dL (ref >39.00)
LDL Cholesterol: 101 mg/dL — ABNORMAL HIGH (ref 0–99)
NonHDL: 121.02
Total CHOL/HDL Ratio: 2
Triglycerides: 100 mg/dL (ref 0.0–149.0)
VLDL: 20 mg/dL (ref 0.0–40.0)

## 2024-05-10 LAB — HEPATIC FUNCTION PANEL
ALT: 23 U/L (ref 0–35)
AST: 20 U/L (ref 0–37)
Albumin: 4.7 g/dL (ref 3.5–5.2)
Alkaline Phosphatase: 74 U/L (ref 39–117)
Bilirubin, Direct: 0.1 mg/dL (ref 0.0–0.3)
Total Bilirubin: 0.4 mg/dL (ref 0.2–1.2)
Total Protein: 7.5 g/dL (ref 6.0–8.3)

## 2024-05-10 MED ORDER — BUPROPION HCL ER (XL) 300 MG PO TB24: 300.0000 mg | ORAL_TABLET | Freq: Every day | ORAL | 1 refills | Status: AC

## 2024-05-10 NOTE — Assessment & Plan Note (Signed)
 Improved.  Pt would like to wean off medication.  Will start w/ Sertraline .  She will decrease to 1/2 tab daily x2 months and see how she feels.  At that time, we will decide if she is going to stop meds entirely, maintain at 50mg  daily, or go back up.  Continue Wellbutrin  at this time.

## 2024-05-10 NOTE — Progress Notes (Signed)
   Subjective:    Patient ID: Alexis Wood, female    DOB: [birth year], 68 y.o.   MRN: [id number]  HPI HTN- chronic problem, on Losartan  50mg  daily.  Denies CP, SOB, HA's, edema.  Hypothyroid- chronic problem, on Levothyroxine  125mcg daily.  Denies changes to skin/hair/nails.  Anxiety/Depression- currently on Wellbutrin  300mg  daily and Sertraline  100mg  daily.  Takes Alprazolam  nightly.  Wants to decrease medication if possible.  Overweight- pt has gained 7 lbs since last visit.  BMI now 28.56.  Pt reports that 9/30 will be 1 yr smoke free   Review of Systems For ROS see HPI     Objective:   Physical Exam Vitals reviewed.  Constitutional:      General: She is not in acute distress.    Appearance: Normal appearance. She is well-developed. She is not ill-appearing.  HENT:     Head: Normocephalic and atraumatic.  Eyes:     Conjunctiva/sclera: Conjunctivae normal.     Pupils: Pupils are equal, round, and reactive to light.  Neck:     Thyroid : No thyromegaly.  Cardiovascular:     Rate and Rhythm: Normal rate and regular rhythm.     Pulses: Normal pulses.     Heart sounds: Normal heart sounds. No murmur heard. Pulmonary:     Effort: Pulmonary effort is normal. No respiratory distress.     Breath sounds: Normal breath sounds.  Abdominal:     General: There is no distension.     Palpations: Abdomen is soft.     Tenderness: There is no abdominal tenderness.  Musculoskeletal:     Cervical back: Normal range of motion and neck supple.     Right lower leg: No edema.     Left lower leg: No edema.  Lymphadenopathy:     Cervical: No cervical adenopathy.  Skin:    General: Skin is warm and dry.  Neurological:     General: No focal deficit present.     Mental Status: She is alert and oriented to person, place, and time.  Psychiatric:        Mood and Affect: Mood normal.        Behavior: Behavior normal.        Thought Content: Thought content normal.            Assessment & Plan:

## 2024-05-10 NOTE — Patient Instructions (Signed)
 Schedule your complete physical in 6 months We'll notify you of your lab results and make any changes if needed DECREASE the Sertraline  to 1/2 tab daily x2 months.  At that time, message me and let me know how you are feeling and if we can try and stop entirely Continue to work on healthy diet and regular exercise- you can do it! I'm SO proud of you for quitting smoking!! Call with any questions or concerns Stay Safe!  Stay Healthy! Happy Labor Day!!

## 2024-05-10 NOTE — Assessment & Plan Note (Signed)
Chronic problem.  Currently asymptomatic on Levothyroxine daily.  Check labs.  Adjust meds prn

## 2024-05-10 NOTE — Assessment & Plan Note (Signed)
 She has gained 7 lbs since stopping smoking.  BMI now 28.56  Encouraged low carb diet and regular physical activity.  Will continue to follow.

## 2024-05-11 ENCOUNTER — Ambulatory Visit: Payer: Self-pay | Admitting: Family Medicine

## 2024-05-11 NOTE — Telephone Encounter (Signed)
 Patient is concerned about high cholesterol. Please advise

## 2024-06-13 ENCOUNTER — Other Ambulatory Visit: Payer: Self-pay | Admitting: Family Medicine

## 2024-06-14 NOTE — Telephone Encounter (Signed)
 Request received with PCP out of office.  Controlled substance database reviewed.  Alprazolam  1 mg #60 last filled on 04/21/2024, previously 02/17/2024, 12/17/2023, 10/27/2023.  No concerns.  Anxiety discussed at her August 25 visit.  Alprazolam  refilled.

## 2024-06-16 ENCOUNTER — Other Ambulatory Visit (HOSPITAL_BASED_OUTPATIENT_CLINIC_OR_DEPARTMENT_OTHER): Payer: Self-pay | Admitting: Family Medicine

## 2024-06-16 ENCOUNTER — Other Ambulatory Visit: Payer: Self-pay | Admitting: Family Medicine

## 2024-06-16 DIAGNOSIS — M8580 Other specified disorders of bone density and structure, unspecified site: Secondary | ICD-10-CM

## 2024-06-16 DIAGNOSIS — Z1231 Encounter for screening mammogram for malignant neoplasm of breast: Secondary | ICD-10-CM

## 2024-06-16 NOTE — Progress Notes (Signed)
 DEXA ordered at pt's request

## 2024-07-02 ENCOUNTER — Other Ambulatory Visit: Payer: Self-pay | Admitting: Family Medicine

## 2024-08-04 ENCOUNTER — Other Ambulatory Visit: Payer: Self-pay | Admitting: Family Medicine

## 2024-08-04 ENCOUNTER — Ambulatory Visit (HOSPITAL_BASED_OUTPATIENT_CLINIC_OR_DEPARTMENT_OTHER)
Admission: RE | Admit: 2024-08-04 | Discharge: 2024-08-04 | Disposition: A | Discharge status: Home / Self Care | Source: Ambulatory Visit | Attending: Family Medicine | Admitting: Family Medicine

## 2024-08-04 ENCOUNTER — Encounter (HOSPITAL_BASED_OUTPATIENT_CLINIC_OR_DEPARTMENT_OTHER): Payer: Self-pay

## 2024-08-04 DIAGNOSIS — Z1231 Encounter for screening mammogram for malignant neoplasm of breast: Secondary | ICD-10-CM | POA: Diagnosis present

## 2024-08-04 DIAGNOSIS — M8580 Other specified disorders of bone density and structure, unspecified site: Secondary | ICD-10-CM

## 2024-08-04 DIAGNOSIS — M8589 Other specified disorders of bone density and structure, multiple sites: Secondary | ICD-10-CM | POA: Diagnosis not present

## 2024-08-16 ENCOUNTER — Other Ambulatory Visit: Payer: Self-pay | Admitting: Family Medicine

## 2024-08-16 NOTE — Telephone Encounter (Signed)
 Requested Prescriptions   Pending Prescriptions Disp Refills   ALPRAZolam  (XANAX ) 1 MG tablet [Pharmacy Med Name: ALPRAZOLAM  1 MG TABLET] 60 tablet 0    Sig: TAKE 1 TABLET (1 MG TOTAL) BY MOUTH 2 (TWO) TIMES DAILY AS NEEDED. FOR ANXIETY     Date of patient request: 08/16/24 Last office visit: 05/2024 Upcoming visit: 10/2024 Date of last refill: 06/14/24 Last refill amount: 60

## 2024-08-17 ENCOUNTER — Telehealth: Payer: Self-pay

## 2024-08-17 NOTE — Telephone Encounter (Signed)
 Patient is having a colonoscopy completed on 12/10 and is wondering if she can take her Sertraline  and Alprazolam  before and after this procedure?

## 2024-08-17 NOTE — Telephone Encounter (Signed)
 Copied from CRM [id number]. Topic: Clinical - Medical Advice >> Aug 17, 2024 11:33 AM Mesmerise C wrote: Reason for CRM: Patient wants to leave a message for Dr. Mahlon nurse has a colonoscopy next week on 12/10 inquiring if she can take sertraline  and Alprazolam  says she hasn't been able to get ahold of the office to ask them

## 2024-08-17 NOTE — Telephone Encounter (Signed)
 Yes.  She can take both medications prior to procedure

## 2024-08-17 NOTE — Telephone Encounter (Signed)
 Relayed message to patient

## 2024-08-25 LAB — HM COLONOSCOPY

## 2024-09-08 ENCOUNTER — Ambulatory Visit: Payer: Self-pay

## 2024-09-08 NOTE — Telephone Encounter (Signed)
 FYI - patient going to Urgent Care [facility].

## 2024-09-08 NOTE — Telephone Encounter (Signed)
 FYI Only or Action Required?: FYI only for provider: patient going to Urgent Care [facility].  Patient was last seen in primary care on 05/10/2024 by Mahlon Comer BRAVO, MD.  Called Nurse Triage reporting Urinary Frequency.  Symptoms began 2 weeks ago.  Interventions attempted: Nothing.  Symptoms are: unchanged.  Triage Disposition: See Physician Within 24 Hours  Patient/caregiver understands and will follow disposition?: Yes--Urgent Care         Message from Akutan C sent at 09/08/2024  9:09 AM EST  Reason for Triage: Patient called in stated she has been dealing with a UTI, no pain urine is dark yellow and cloudy and has odor want to know if antibiotic can be called in   Reason for Disposition  Urinating more frequently than usual (i.e., frequency) OR new-onset of the feeling of an urgent need to urinate (i.e., urgency)  Answer Assessment - Initial Assessment Questions Patient had a colonoscopy two weeks ago Since then patient has been having dark yellow urine, cloudy urine, and a strong odor. Urinary frequency present as well She denies any major pain She denies fever, abdominal pain, back pain, flank pain  No openings at PCP office or any offices within the region. Patient is advised that the recommendation is that she be seen and evaluated within the next 24 hours Patient states she will go to the Urgent Care at [facility]   Patient is advised to call us  back if anything changes or with any further questions/concerns. Patient is advised that if anything worsens to go to the Emergency Room. Patient verbalized understanding.  Protocols used: Urinary Symptoms-A-AH

## 2024-10-08 ENCOUNTER — Other Ambulatory Visit: Payer: Self-pay | Admitting: Family Medicine

## 2024-10-11 NOTE — Telephone Encounter (Signed)
 Requested Prescriptions   Pending Prescriptions Disp Refills   ALPRAZolam  (XANAX ) 1 MG tablet [Pharmacy Med Name: ALPRAZOLAM  1 MG TABLET] 60 tablet 0    Sig: TAKE 1 TABLET BY MOUTH TWICE A DAY AS NEEDED FOR ANXIETY     Date of patient request: 10/12/23 Last office visit: 05/10/2024 Upcoming visit: 11/12/2024 Date of last refill: 08/16/24 Last refill amount: 60

## 2024-11-12 ENCOUNTER — Encounter: Admitting: Family Medicine
